# Patient Record
Sex: Female | Born: 1950 | Race: White | Hispanic: No | Smoking: Current every day smoker
Health system: Southern US, Community
[De-identification: ages and names within clinical notes are randomized; demographics above are authoritative.]

## PROBLEM LIST (undated history)

## (undated) DIAGNOSIS — F32A Depression, unspecified: Secondary | ICD-10-CM

## (undated) DIAGNOSIS — Z9889 Other specified postprocedural states: Secondary | ICD-10-CM

## (undated) DIAGNOSIS — R112 Nausea with vomiting, unspecified: Secondary | ICD-10-CM

## (undated) DIAGNOSIS — F329 Major depressive disorder, single episode, unspecified: Secondary | ICD-10-CM

## (undated) DIAGNOSIS — I10 Essential (primary) hypertension: Secondary | ICD-10-CM

## (undated) DIAGNOSIS — F419 Anxiety disorder, unspecified: Secondary | ICD-10-CM

## (undated) DIAGNOSIS — I82A12 Acute embolism and thrombosis of left axillary vein: Secondary | ICD-10-CM

## (undated) DIAGNOSIS — I2699 Other pulmonary embolism without acute cor pulmonale: Secondary | ICD-10-CM

## (undated) HISTORY — PX: HAND SURGERY: SHX662

## (undated) HISTORY — PX: ARTHROSCOPIC REPAIR ACL: SUR80

---

## 1999-02-04 ENCOUNTER — Other Ambulatory Visit: Admission: RE | Admit: 1999-02-04 | Discharge: 1999-02-04 | Payer: Self-pay | Admitting: Gynecology

## 1999-02-11 ENCOUNTER — Other Ambulatory Visit: Admission: RE | Admit: 1999-02-11 | Discharge: 1999-02-11 | Payer: Self-pay | Admitting: Gynecology

## 2001-03-03 ENCOUNTER — Other Ambulatory Visit: Admission: RE | Admit: 2001-03-03 | Discharge: 2001-03-03 | Payer: Self-pay | Admitting: Gynecology

## 2002-10-09 ENCOUNTER — Encounter: Payer: Self-pay | Admitting: Family Medicine

## 2002-10-09 ENCOUNTER — Encounter: Admission: RE | Admit: 2002-10-09 | Discharge: 2002-10-09 | Payer: Self-pay | Admitting: Family Medicine

## 2002-12-14 ENCOUNTER — Other Ambulatory Visit: Admission: RE | Admit: 2002-12-14 | Discharge: 2002-12-14 | Payer: Self-pay | Admitting: Gynecology

## 2004-12-18 ENCOUNTER — Other Ambulatory Visit: Admission: RE | Admit: 2004-12-18 | Discharge: 2004-12-18 | Payer: Self-pay | Admitting: Gynecology

## 2006-03-16 ENCOUNTER — Other Ambulatory Visit: Admission: RE | Admit: 2006-03-16 | Discharge: 2006-03-16 | Payer: Self-pay | Admitting: Gynecology

## 2006-03-24 ENCOUNTER — Encounter: Admission: RE | Admit: 2006-03-24 | Discharge: 2006-03-24 | Payer: Self-pay | Admitting: Surgery

## 2006-03-24 ENCOUNTER — Encounter (INDEPENDENT_AMBULATORY_CARE_PROVIDER_SITE_OTHER): Payer: Self-pay | Admitting: Specialist

## 2006-03-24 ENCOUNTER — Other Ambulatory Visit: Admission: RE | Admit: 2006-03-24 | Discharge: 2006-03-24 | Payer: Self-pay | Admitting: Interventional Radiology

## 2007-07-07 ENCOUNTER — Encounter: Admission: RE | Admit: 2007-07-07 | Discharge: 2007-07-07 | Payer: Self-pay | Admitting: Family Medicine

## 2007-07-09 ENCOUNTER — Encounter: Admission: RE | Admit: 2007-07-09 | Discharge: 2007-07-09 | Payer: Self-pay | Admitting: Family Medicine

## 2007-07-13 ENCOUNTER — Ambulatory Visit (HOSPITAL_COMMUNITY): Admission: RE | Admit: 2007-07-13 | Discharge: 2007-07-13 | Payer: Self-pay | Admitting: Family Medicine

## 2007-08-24 ENCOUNTER — Encounter: Admission: RE | Admit: 2007-08-24 | Discharge: 2007-08-24 | Payer: Self-pay | Admitting: Family Medicine

## 2008-11-24 ENCOUNTER — Emergency Department (HOSPITAL_BASED_OUTPATIENT_CLINIC_OR_DEPARTMENT_OTHER): Admission: EM | Admit: 2008-11-24 | Discharge: 2008-11-24 | Payer: Self-pay | Admitting: Emergency Medicine

## 2009-11-21 ENCOUNTER — Encounter: Admission: RE | Admit: 2009-11-21 | Discharge: 2009-11-21 | Payer: Self-pay | Admitting: Gynecology

## 2010-09-19 ENCOUNTER — Other Ambulatory Visit: Payer: Self-pay | Admitting: Gynecology

## 2010-09-19 DIAGNOSIS — Z1231 Encounter for screening mammogram for malignant neoplasm of breast: Secondary | ICD-10-CM

## 2010-09-19 DIAGNOSIS — Z09 Encounter for follow-up examination after completed treatment for conditions other than malignant neoplasm: Secondary | ICD-10-CM

## 2010-09-30 ENCOUNTER — Ambulatory Visit
Admission: RE | Admit: 2010-09-30 | Discharge: 2010-09-30 | Disposition: A | Discharge status: Home / Self Care | Payer: 59 | Source: Ambulatory Visit | Attending: Gynecology | Admitting: Gynecology

## 2010-09-30 ENCOUNTER — Ambulatory Visit: Payer: Self-pay

## 2010-09-30 DIAGNOSIS — Z1231 Encounter for screening mammogram for malignant neoplasm of breast: Secondary | ICD-10-CM

## 2010-09-30 DIAGNOSIS — Z09 Encounter for follow-up examination after completed treatment for conditions other than malignant neoplasm: Secondary | ICD-10-CM

## 2010-10-24 NOTE — Procedures (Signed)
CLINICAL HISTORY:  A 60 year old woman syncopal episodes.  The patient  describes as awake and alert.  This is a routine EEG done with photic  stimulation and hyperventilation.   DESCRIPTION:  Dominant rhythm's tracing is a moderate amplitude alpha  rhythm of 11 Hz which predominates posteriorly, appears without abnormal  asymmetry and attenuates with eye opening and closing.  Low amplitude  fast activity seen frontally and centrally and appears without abnormal  asymmetry.  No focal slowing is noted and no epileptiform discharge is  seen.  The patient remains in awake state throughout the recording.  Photic dilation produced strong driving responses.  Hyperventilation  produced no significant change the background rhythms.  Single channel  devoted to EKG revealed sinus rhythm throughout with rate of  approximately 72 beats per minute.   CONCLUSIONS:  Normal study in the awake state.      Michael L. Thad Ranger, M.D.  Electronically Signed     BJY:NWGN  D:  07/14/2007 07:45:57  T:  07/15/2007 05:26:59  Job #:  [id number]

## 2011-05-14 ENCOUNTER — Ambulatory Visit (INDEPENDENT_AMBULATORY_CARE_PROVIDER_SITE_OTHER): Payer: 59

## 2011-05-14 DIAGNOSIS — M79609 Pain in unspecified limb: Secondary | ICD-10-CM

## 2011-05-14 DIAGNOSIS — S61209A Unspecified open wound of unspecified finger without damage to nail, initial encounter: Secondary | ICD-10-CM

## 2011-08-18 ENCOUNTER — Ambulatory Visit
Admission: RE | Admit: 2011-08-18 | Discharge: 2011-08-18 | Disposition: A | Discharge status: Home / Self Care | Payer: 59 | Source: Ambulatory Visit | Attending: Family Medicine | Admitting: Family Medicine

## 2011-08-18 ENCOUNTER — Other Ambulatory Visit: Payer: Self-pay | Admitting: Family Medicine

## 2011-08-18 DIAGNOSIS — I2510 Atherosclerotic heart disease of native coronary artery without angina pectoris: Secondary | ICD-10-CM

## 2012-07-05 ENCOUNTER — Encounter (HOSPITAL_COMMUNITY): Payer: Self-pay | Admitting: *Deleted

## 2012-07-05 ENCOUNTER — Emergency Department (HOSPITAL_COMMUNITY)
Admission: EM | Admit: 2012-07-05 | Discharge: 2012-07-05 | Disposition: A | Discharge status: Home / Self Care | Payer: 59 | Attending: Emergency Medicine | Admitting: Emergency Medicine

## 2012-07-05 DIAGNOSIS — T7840XA Allergy, unspecified, initial encounter: Secondary | ICD-10-CM

## 2012-07-05 DIAGNOSIS — Z87891 Personal history of nicotine dependence: Secondary | ICD-10-CM | POA: Insufficient documentation

## 2012-07-05 DIAGNOSIS — E119 Type 2 diabetes mellitus without complications: Secondary | ICD-10-CM | POA: Insufficient documentation

## 2012-07-05 DIAGNOSIS — Z7982 Long term (current) use of aspirin: Secondary | ICD-10-CM | POA: Insufficient documentation

## 2012-07-05 DIAGNOSIS — L509 Urticaria, unspecified: Secondary | ICD-10-CM | POA: Insufficient documentation

## 2012-07-05 MED ORDER — PREDNISONE 20 MG PO TABS
60.0000 mg | ORAL_TABLET | Freq: Once | ORAL | Status: AC
  Administered 2012-07-05: 60 mg via ORAL
  Filled 2012-07-05: qty 3

## 2012-07-05 MED ORDER — SODIUM CHLORIDE 0.9 % IV BOLUS (SEPSIS)
500.0000 mL | Freq: Once | INTRAVENOUS | Status: AC
  Administered 2012-07-05: 500 mL via INTRAVENOUS

## 2012-07-05 MED ORDER — EPINEPHRINE 0.3 MG/0.3ML IJ DEVI: 0.3000 mg | INTRAMUSCULAR | Status: AC | PRN

## 2012-07-05 MED ORDER — FAMOTIDINE IN NACL 20-0.9 MG/50ML-% IV SOLN
20.0000 mg | Freq: Once | INTRAVENOUS | Status: DC
  Filled 2012-07-05: qty 50

## 2012-07-05 NOTE — ED Notes (Signed)
Around 11pm she noticed hives on her feet, legs, neck, and mild swelling to her tongue.  She had mild syncope when she stood up.  Breath sound clear, BP 132/90 PTA, HR is the 80s, 99-100% Or sats on R/A.  Patient took 50 mg of benadryl PO before [facility] arrived around 23:30 and [facility] gave 25 mg IV around 12:31, 50mg  of Zantac was given as well.  Resp. Intact.  20g to left hand.

## 2012-07-05 NOTE — ED Notes (Signed)
Patient states that her tongue is back to normal, no tongue swelling, or lips swelling.

## 2012-07-05 NOTE — ED Notes (Signed)
WUJ:[id number]<JY> Expected date:07/05/12<BR> Expected time:12:37 AM<BR> Means of arrival:Ambulance<BR> Comments:<BR> Allergic reaction/Hives

## 2012-07-05 NOTE — ED Provider Notes (Signed)
History     CSN: [id number]  Arrival date & time 07/05/12  0104   First MD Initiated Contact with Patient 07/05/12 0154      Chief Complaint  Patient presents with  . Allergic Reaction    (Consider location/radiation/quality/duration/timing/severity/associated sxs/prior treatment) HPI Comments: Tina Mccullough is a 62 y.o. female with a history of diabetes and anaphylactic allergic reaction presents emergency department complaining of allergic reaction.  Patient states that there was no known triggering medication or allergen.  Onset of symptoms began approximately 1130 p.m.  Patient states that she noticed urticarial rash on her ankles and upper extremities and then she felt her tongue and lips begin taking well.  Patient then went to the kitchen to take some Benadryl when she felt lightheaded.  Her husband at this time became concerned because this is a very similar presentation to the last time she had an anaphylactic reaction.  Since the onset patient states that she no longer is having any tingling sensation of her mouth and denies any throat closing sensation.  Patient has not had any difficulty breathing or swallowing secretions.  In route EMS gave the patient another Benadryl 25 mg IV and Zantac 50 mg.  Patient is a 62 y.o. female presenting with allergic reaction. The history is provided by the patient.  Allergic Reaction    History reviewed. No pertinent past medical history.  History reviewed. No pertinent past surgical history.  History reviewed. No pertinent family history.  History  Substance Use Topics  . Smoking status: Former Games developer  . Smokeless tobacco: Not on file  . Alcohol Use: No    OB History    Grav Para Term Preterm Abortions TAB SAB Ect Mult Living                  Review of Systems  All other systems reviewed and are negative.    Allergies  Review of patient's allergies indicates no known allergies.  Home Medications   Current  Outpatient Rx  Name  Route  Sig  Dispense  Refill  . ASPIRIN 81 MG PO CHEW   Oral   Chew 81 mg by mouth daily.         Marland Kitchen DIPHENHYDRAMINE HCL 25 MG PO CAPS   Oral   Take 25 mg by mouth every 6 (six) hours as needed. Allergies           BP 142/88  Pulse 77  Temp 97.5 F (36.4 C) (Oral)  Resp 14  SpO2 97%  Physical Exam  Nursing note and vitals reviewed. Constitutional: She is oriented to person, place, and time. She appears well-developed and well-nourished. No distress.  HENT:  Head: Normocephalic and atraumatic.  Mouth/Throat: Oropharynx is clear and moist and mucous membranes are normal.       No sign of airway obstruction. No edema of face, eyelids, lips, tongue, uvula.Marland Kitchen Uvula midline, no nasal congestion or drooling.  Tongue not elevated. No trismus.  Neck: Trachea normal, normal range of motion and full passive range of motion without pain. Neck supple. Carotid bruit is not present. No tracheal deviation present.       No carotid bruits or stridor  Cardiovascular: Normal rate, regular rhythm, intact distal pulses and normal pulses.        Not tachycardic  Pulmonary/Chest: Effort normal. No stridor.  Musculoskeletal: Normal range of motion.  Neurological: She is alert and oriented to person, place, and time.  Skin: Skin is warm and intact.  Rash noted. Rash is urticarial. She is not diaphoretic.       Not diaphoretic. Very mild raised erythematous welts, pruritic in nature, located forearms and lower extremities. Blanchable urticaria, no petechiae or purpura.   Psychiatric: She has a normal mood and affect. Her behavior is normal.    ED Course  Procedures (including critical care time)  Labs Reviewed - No data to display No results found.   No diagnosis found.    MDM  Mild allergic reaction  Patient re-evaluated prior to dc, is hemodynamically stable, in no respiratory distress, and denies the feeling of throat closing. Pt has been advised to take OTC  benadryl & return to the ED if they have a mod-severe allergic rxn (s/s including throat closing, difficulty breathing, swelling of lips face or tongue). Pt is to follow up with their PCP. Pt is agreeable with plan & verbalizes understanding. Will dc w RX for epi pens per request of pt as hers have expired.          Jaci Carrel, [place] 07/05/12 0214

## 2012-07-05 NOTE — ED Provider Notes (Signed)
Medical screening examination/treatment/procedure(s) were performed by non-physician practitioner and as supervising physician I was immediately available for consultation/collaboration.   Lyanne Co, MD 07/05/12 [phone]

## 2012-08-30 ENCOUNTER — Other Ambulatory Visit (HOSPITAL_COMMUNITY)
Admission: RE | Admit: 2012-08-30 | Discharge: 2012-08-30 | Disposition: A | Discharge status: Home / Self Care | Payer: 59 | Source: Ambulatory Visit | Attending: Family Medicine | Admitting: Family Medicine

## 2012-08-30 ENCOUNTER — Other Ambulatory Visit: Payer: Self-pay | Admitting: Family Medicine

## 2012-08-30 DIAGNOSIS — Z124 Encounter for screening for malignant neoplasm of cervix: Secondary | ICD-10-CM | POA: Insufficient documentation

## 2012-08-30 DIAGNOSIS — Z1151 Encounter for screening for human papillomavirus (HPV): Secondary | ICD-10-CM | POA: Insufficient documentation

## 2012-09-06 ENCOUNTER — Other Ambulatory Visit: Payer: Self-pay | Admitting: Family Medicine

## 2012-09-07 ENCOUNTER — Other Ambulatory Visit: Payer: Self-pay | Admitting: Family Medicine

## 2012-09-07 DIAGNOSIS — R591 Generalized enlarged lymph nodes: Secondary | ICD-10-CM

## 2012-11-16 ENCOUNTER — Other Ambulatory Visit: Payer: 59

## 2013-01-16 ENCOUNTER — Inpatient Hospital Stay: Admission: RE | Admit: 2013-01-16 | Payer: 59 | Source: Ambulatory Visit

## 2013-09-07 ENCOUNTER — Ambulatory Visit
Admission: RE | Admit: 2013-09-07 | Discharge: 2013-09-07 | Disposition: A | Discharge status: Home / Self Care | Payer: 59 | Source: Ambulatory Visit | Attending: Family Medicine | Admitting: Family Medicine

## 2013-09-07 ENCOUNTER — Other Ambulatory Visit: Payer: Self-pay | Admitting: Family Medicine

## 2013-09-07 DIAGNOSIS — Z09 Encounter for follow-up examination after completed treatment for conditions other than malignant neoplasm: Secondary | ICD-10-CM

## 2014-04-16 ENCOUNTER — Ambulatory Visit (HOSPITAL_COMMUNITY)
Admission: RE | Admit: 2014-04-16 | Discharge: 2014-04-16 | Disposition: A | Discharge status: Home / Self Care | Payer: 59 | Source: Ambulatory Visit | Attending: Surgery | Admitting: Surgery

## 2014-04-16 ENCOUNTER — Other Ambulatory Visit (HOSPITAL_COMMUNITY): Payer: Self-pay | Admitting: Family Medicine

## 2014-04-16 DIAGNOSIS — M79602 Pain in left arm: Secondary | ICD-10-CM | POA: Diagnosis not present

## 2014-04-18 ENCOUNTER — Encounter (HOSPITAL_COMMUNITY): Payer: Self-pay

## 2014-04-18 ENCOUNTER — Ambulatory Visit (HOSPITAL_COMMUNITY)
Admission: RE | Admit: 2014-04-18 | Discharge: 2014-04-18 | Disposition: A | Discharge status: Home / Self Care | Payer: 59 | Source: Ambulatory Visit | Attending: Family Medicine | Admitting: Family Medicine

## 2014-04-18 ENCOUNTER — Other Ambulatory Visit (HOSPITAL_COMMUNITY): Payer: Self-pay | Admitting: Family Medicine

## 2014-04-18 DIAGNOSIS — I82622 Acute embolism and thrombosis of deep veins of left upper extremity: Secondary | ICD-10-CM

## 2014-04-18 DIAGNOSIS — M25512 Pain in left shoulder: Secondary | ICD-10-CM

## 2014-04-18 DIAGNOSIS — R222 Localized swelling, mass and lump, trunk: Secondary | ICD-10-CM | POA: Diagnosis not present

## 2014-04-18 MED ORDER — IOHEXOL 300 MG/ML  SOLN
80.0000 mL | Freq: Once | INTRAMUSCULAR | Status: AC | PRN
  Administered 2014-04-18: 80 mL via INTRAVENOUS

## 2014-11-21 NOTE — H&P (Signed)
Tina Mccullough is an 64 y.o. female.   Chief Complaint: tumor left mandible  HPI: biopsied last year.   Unknown duration  No past medical history on file.  No past surgical history on file.  No family history on file. Social History:  reports that she has quit smoking. She does not have any smokeless tobacco history on file. She reports that she does not drink alcohol. Her drug history is not on file.  Allergies: No Known Allergies  No prescriptions prior to admission    No results found for this or any previous visit (from the past 48 hour(s)). No results found.  ROS  There were no vitals taken for this visit. Physical Exam  HENT:  Head: Normocephalic.  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

## 2014-11-21 NOTE — Consult Note (Signed)
  This is a 64 y/o wd/wn w female who presents with a large odontogenic tumor and impacted third molar in the left mandible.  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  It was recommended that the tooth and rest of the cyst be removed. 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  Indications for treatment and alternatives were discussed.  Possible paresthesia for the lip tongue and chin was discussed.  Possible need for further surgery was discussed.

## 2014-11-21 NOTE — H&P (Signed)
  H & P by Dr. Serita Grammes

## 2014-11-26 ENCOUNTER — Encounter (HOSPITAL_BASED_OUTPATIENT_CLINIC_OR_DEPARTMENT_OTHER)
Admission: RE | Admit: 2014-11-26 | Discharge: 2014-11-26 | Disposition: A | Discharge status: Home / Self Care | Payer: 59 | Source: Ambulatory Visit | Attending: Oral Surgery | Admitting: Oral Surgery

## 2014-11-26 ENCOUNTER — Encounter (HOSPITAL_BASED_OUTPATIENT_CLINIC_OR_DEPARTMENT_OTHER): Payer: Self-pay | Admitting: *Deleted

## 2014-11-26 DIAGNOSIS — Z86718 Personal history of other venous thrombosis and embolism: Secondary | ICD-10-CM | POA: Diagnosis not present

## 2014-11-26 DIAGNOSIS — K011 Impacted teeth: Secondary | ICD-10-CM | POA: Diagnosis not present

## 2014-11-26 DIAGNOSIS — Z87891 Personal history of nicotine dependence: Secondary | ICD-10-CM | POA: Diagnosis not present

## 2014-11-26 DIAGNOSIS — K090 Developmental odontogenic cysts: Secondary | ICD-10-CM | POA: Diagnosis not present

## 2014-11-26 LAB — BASIC METABOLIC PANEL
ANION GAP: 7 (ref 5–15)
BUN: 11 mg/dL (ref 6–20)
CHLORIDE: 103 mmol/L (ref 101–111)
CO2: 28 mmol/L (ref 22–32)
CREATININE: 0.92 mg/dL (ref 0.44–1.00)
Calcium: 9.9 mg/dL (ref 8.9–10.3)
GFR calc Af Amer: >60 mL/min (ref >60)
Glucose, Bld: 92 mg/dL (ref 65–99)
POTASSIUM: 3.8 mmol/L (ref 3.5–5.1)
Sodium: 138 mmol/L (ref 135–145)

## 2014-11-28 ENCOUNTER — Encounter (HOSPITAL_BASED_OUTPATIENT_CLINIC_OR_DEPARTMENT_OTHER)
Admission: RE | Disposition: A | Discharge status: Home / Self Care | Payer: Self-pay | Source: Ambulatory Visit | Attending: Oral Surgery

## 2014-11-28 ENCOUNTER — Ambulatory Visit (HOSPITAL_BASED_OUTPATIENT_CLINIC_OR_DEPARTMENT_OTHER): Payer: 59 | Admitting: Anesthesiology

## 2014-11-28 ENCOUNTER — Encounter (HOSPITAL_BASED_OUTPATIENT_CLINIC_OR_DEPARTMENT_OTHER): Payer: Self-pay | Admitting: *Deleted

## 2014-11-28 ENCOUNTER — Ambulatory Visit (HOSPITAL_BASED_OUTPATIENT_CLINIC_OR_DEPARTMENT_OTHER)
Admission: RE | Admit: 2014-11-28 | Discharge: 2014-11-28 | Disposition: A | Discharge status: Home / Self Care | Payer: 59 | Source: Ambulatory Visit | Attending: Oral Surgery | Admitting: Oral Surgery

## 2014-11-28 DIAGNOSIS — Z87891 Personal history of nicotine dependence: Secondary | ICD-10-CM | POA: Insufficient documentation

## 2014-11-28 DIAGNOSIS — Z86718 Personal history of other venous thrombosis and embolism: Secondary | ICD-10-CM | POA: Insufficient documentation

## 2014-11-28 DIAGNOSIS — K011 Impacted teeth: Secondary | ICD-10-CM | POA: Insufficient documentation

## 2014-11-28 DIAGNOSIS — K090 Developmental odontogenic cysts: Secondary | ICD-10-CM | POA: Insufficient documentation

## 2014-11-28 HISTORY — DX: Major depressive disorder, single episode, unspecified: F32.9

## 2014-11-28 HISTORY — DX: Depression, unspecified: F32.A

## 2014-11-28 HISTORY — DX: Anxiety disorder, unspecified: F41.9

## 2014-11-28 HISTORY — DX: Essential (primary) hypertension: I10

## 2014-11-28 HISTORY — DX: Nausea with vomiting, unspecified: Z98.890

## 2014-11-28 HISTORY — PX: EXCISION ORAL TUMOR: SHX6265

## 2014-11-28 HISTORY — DX: Acute embolism and thrombosis of left axillary vein: I82.A12

## 2014-11-28 HISTORY — DX: Nausea with vomiting, unspecified: R11.2

## 2014-11-28 LAB — POCT HEMOGLOBIN-HEMACUE: HEMOGLOBIN: 13.8 g/dL (ref 12.0–15.0)

## 2014-11-28 SURGERY — EXCISION, NEOPLASM, MOUTH
Anesthesia: General | Site: Mouth | Laterality: Left

## 2014-11-28 MED ORDER — CEPHALEXIN 500 MG PO CAPS: 500.0000 mg | ORAL_CAPSULE | Freq: Four times a day (QID) | ORAL | Status: DC

## 2014-11-28 MED ORDER — HYDROMORPHONE HCL 1 MG/ML IJ SOLN: 0.2500 mg | INTRAMUSCULAR | Status: DC | PRN

## 2014-11-28 MED ORDER — PROMETHAZINE HCL 25 MG/ML IJ SOLN
6.2500 mg | INTRAMUSCULAR | Status: DC | PRN
  Administered 2014-11-28: 6.25 mg via INTRAVENOUS

## 2014-11-28 MED ORDER — PROMETHAZINE HCL 25 MG/ML IJ SOLN: 6.2500 mg | INTRAMUSCULAR | Status: DC | PRN

## 2014-11-28 MED ORDER — GLYCOPYRROLATE 0.2 MG/ML IJ SOLN: 0.2000 mg | Freq: Once | INTRAMUSCULAR | Status: DC | PRN

## 2014-11-28 MED ORDER — MIDAZOLAM HCL 2 MG/2ML IJ SOLN
1.0000 mg | INTRAMUSCULAR | Status: DC | PRN
  Administered 2014-11-28: 2 mg via INTRAVENOUS

## 2014-11-28 MED ORDER — SCOPOLAMINE 1 MG/3DAYS TD PT72: 1.0000 | MEDICATED_PATCH | Freq: Once | TRANSDERMAL | Status: DC | PRN

## 2014-11-28 MED ORDER — LACTATED RINGERS IV SOLN
INTRAVENOUS | Status: DC
  Administered 2014-11-28 (×2): via INTRAVENOUS

## 2014-11-28 MED ORDER — LIDOCAINE-EPINEPHRINE 2 %-1:100000 IJ SOLN
INTRAMUSCULAR | Status: DC | PRN
  Administered 2014-11-28: 1.7 mL via INTRADERMAL

## 2014-11-28 MED ORDER — SUCCINYLCHOLINE CHLORIDE 20 MG/ML IJ SOLN
INTRAMUSCULAR | Status: DC | PRN
  Administered 2014-11-28: 100 mg via INTRAVENOUS

## 2014-11-28 MED ORDER — HYDROMORPHONE HCL 1 MG/ML IJ SOLN
INTRAMUSCULAR | Status: AC
  Filled 2014-11-28: qty 1

## 2014-11-28 MED ORDER — FENTANYL CITRATE (PF) 100 MCG/2ML IJ SOLN
50.0000 ug | INTRAMUSCULAR | Status: DC | PRN
  Administered 2014-11-28 (×2): 50 ug via INTRAVENOUS

## 2014-11-28 MED ORDER — ONDANSETRON HCL 4 MG/2ML IJ SOLN
INTRAMUSCULAR | Status: DC | PRN
  Administered 2014-11-28: 4 mg via INTRAVENOUS

## 2014-11-28 MED ORDER — CEFAZOLIN SODIUM-DEXTROSE 2-3 GM-% IV SOLR
2.0000 g | INTRAVENOUS | Status: AC
  Administered 2014-11-28: 2 g via INTRAVENOUS

## 2014-11-28 MED ORDER — BENZOIN COMPOUND EX TINC
CUTANEOUS | Status: DC | PRN
  Administered 2014-11-28: 1 via TOPICAL

## 2014-11-28 MED ORDER — HYDROMORPHONE HCL 1 MG/ML IJ SOLN
0.2500 mg | INTRAMUSCULAR | Status: DC | PRN
  Administered 2014-11-28 (×2): 0.25 mg via INTRAVENOUS

## 2014-11-28 MED ORDER — MIDAZOLAM HCL 2 MG/2ML IJ SOLN
INTRAMUSCULAR | Status: AC
  Filled 2014-11-28: qty 2

## 2014-11-28 MED ORDER — DEXAMETHASONE SODIUM PHOSPHATE 4 MG/ML IJ SOLN
INTRAMUSCULAR | Status: DC | PRN
  Administered 2014-11-28: 10 mg via INTRAVENOUS

## 2014-11-28 MED ORDER — LIDOCAINE HCL (CARDIAC) 10 MG/ML IV SOLN
INTRAVENOUS | Status: DC | PRN
  Administered 2014-11-28: 80 mg via INTRAVENOUS

## 2014-11-28 MED ORDER — HYDROCODONE-ACETAMINOPHEN 5-325 MG PO TABS: 1.0000 | ORAL_TABLET | Freq: Four times a day (QID) | ORAL | Status: DC | PRN

## 2014-11-28 MED ORDER — LIDOCAINE-EPINEPHRINE 2 %-1:100000 IJ SOLN
INTRAMUSCULAR | Status: AC
  Filled 2014-11-28: qty 6.8

## 2014-11-28 MED ORDER — FENTANYL CITRATE (PF) 100 MCG/2ML IJ SOLN
INTRAMUSCULAR | Status: AC
  Filled 2014-11-28: qty 6

## 2014-11-28 MED ORDER — BACITRACIN-NEOMYCIN-POLYMYXIN 400-5-5000 EX OINT
TOPICAL_OINTMENT | CUTANEOUS | Status: AC
  Filled 2014-11-28: qty 1

## 2014-11-28 MED ORDER — BUPIVACAINE-EPINEPHRINE (PF) 0.5% -1:200000 IJ SOLN
INTRAMUSCULAR | Status: AC
  Filled 2014-11-28: qty 7.2

## 2014-11-28 MED ORDER — CEFAZOLIN SODIUM-DEXTROSE 2-3 GM-% IV SOLR
INTRAVENOUS | Status: AC
  Filled 2014-11-28: qty 50

## 2014-11-28 MED ORDER — PROMETHAZINE HCL 25 MG/ML IJ SOLN
INTRAMUSCULAR | Status: AC
  Filled 2014-11-28: qty 1

## 2014-11-28 MED ORDER — PROPOFOL 10 MG/ML IV BOLUS
INTRAVENOUS | Status: DC | PRN
  Administered 2014-11-28: 180 mg via INTRAVENOUS
  Administered 2014-11-28: 50 mg via INTRAVENOUS

## 2014-11-28 SURGICAL SUPPLY — 34 items
BENZOIN TINCTURE PRP APPL 2/3 (GAUZE/BANDAGES/DRESSINGS) IMPLANT
BLADE SURG 15 STRL LF DISP TIS (BLADE) ×1 IMPLANT
BLADE SURG 15 STRL SS (BLADE) ×1
BUR OVAL 4.0X59 (BURR) ×4 IMPLANT
CANISTER SUCT 1200ML W/VALVE (MISCELLANEOUS) ×2 IMPLANT
CATH ROBINSON RED A/P 10FR (CATHETERS) IMPLANT
COVER BACK TABLE 60X90IN (DRAPES) ×2 IMPLANT
COVER MAYO STAND STRL (DRAPES) ×2 IMPLANT
DRAPE U-SHAPE 76X120 STRL (DRAPES) ×2 IMPLANT
GAUZE PACKING IODOFORM 1/4X15 (GAUZE/BANDAGES/DRESSINGS) IMPLANT
GLOVE BIO SURGEON STRL SZ 6.5 (GLOVE) ×4 IMPLANT
GLOVE BIO SURGEON STRL SZ7.5 (GLOVE) ×2 IMPLANT
GOWN STRL REUS W/ TWL LRG LVL3 (GOWN DISPOSABLE) ×1 IMPLANT
GOWN STRL REUS W/ TWL XL LVL3 (GOWN DISPOSABLE) IMPLANT
GOWN STRL REUS W/TWL LRG LVL3 (GOWN DISPOSABLE) ×1
GOWN STRL REUS W/TWL XL LVL3 (GOWN DISPOSABLE)
IV NS 500ML (IV SOLUTION)
IV NS 500ML BAXH (IV SOLUTION) IMPLANT
NEEDLE DENTAL 27 LONG (NEEDLE) ×2 IMPLANT
NS IRRIG 1000ML POUR BTL (IV SOLUTION) ×2 IMPLANT
PACK BASIN DAY SURGERY FS (CUSTOM PROCEDURE TRAY) ×2 IMPLANT
PATTIES SURGICAL .5 X3 (DISPOSABLE) IMPLANT
SLEEVE SCD COMPRESS KNEE MED (MISCELLANEOUS) ×2 IMPLANT
SPONGE SURGIFOAM ABS GEL 12-7 (HEMOSTASIS) IMPLANT
SUT CHROMIC 3 0 PS 2 (SUTURE) IMPLANT
SUT CHROMIC 4 0 P 3 18 (SUTURE) IMPLANT
SUT SILK 3 0 PS 1 (SUTURE) IMPLANT
SYR 50ML LL SCALE MARK (SYRINGE) ×4 IMPLANT
TOOTHBRUSH ADULT (PERSONAL CARE ITEMS) ×2 IMPLANT
TOWEL OR 17X24 6PK STRL BLUE (TOWEL DISPOSABLE) ×4 IMPLANT
TOWEL OR NON WOVEN STRL DISP B (DISPOSABLE) ×2 IMPLANT
TUBE CONNECTING 20X1/4 (TUBING) ×2 IMPLANT
VENT IRR SPI W TUB AD (MISCELLANEOUS) IMPLANT
YANKAUER SUCT BULB TIP NO VENT (SUCTIONS) ×2 IMPLANT

## 2014-11-28 NOTE — Discharge Instructions (Signed)

## 2014-11-28 NOTE — H&P (Signed)
  Surgery reviewed with patient.   No change in H & P

## 2014-11-28 NOTE — Op Note (Signed)
The patient was brought to the operating room and placed in a supine position in which she remained through the whole procedure.  She was intubated via an oral endotracheal tube. The mouth and face were prepped in the usual fashion for an oral surgical procedure.  A throat pack was placed. 1.5 cc of 2% xylocaine with 1:100000 epi was given as a block on the lower left side.  A child size bite block was used.  A #15 blade was used to make a 2 cm incision over the left ramus.  A release was created at the distobuccal aspect of #19.  A full thickness mucoperiosteal flap was elevated with a periosteal elevator.  A round bur and copious irrigation was used to expose the area.  Soft tissue was curetted out and submitted for path. An attempt was made to mobilize the tooth which was not able to be mobilized.  The crown was sectioned off to create space.  The roots were unable to be mobilized and there for cut below the level of the lesion.  Because this is a dentigerous cyst a coronectomy was done. The defect was further opened to gain access to remove the rest of the lesion.  All soft tissue was submitted.  The area was aggressively curetted and irrigated. 1/4" iodoform gauze was packed into the defect with benzoin and neosporin.  There was good hemostasis.  The patient was extubated on the table and discharged to home. She will be followed by me in my pvt office.

## 2014-11-28 NOTE — Anesthesia Preprocedure Evaluation (Addendum)
Anesthesia Evaluation  Patient identified by MRN, date of birth, ID band Patient awake    Reviewed: Allergy & Precautions, NPO status , Patient's Chart, lab work & pertinent test results  Airway Mallampati: I  TM Distance: >3 FB Neck ROM: Full    Dental  (+) Teeth Intact   Pulmonary Current Smoker,  breath sounds clear to auscultation        Cardiovascular hypertension, + Peripheral Vascular Disease Rhythm:Regular Rate:Normal     Neuro/Psych Anxiety Depression    GI/Hepatic negative GI ROS, Neg liver ROS,   Endo/Other  negative endocrine ROS  Renal/GU negative Renal ROS     Musculoskeletal negative musculoskeletal ROS (+)   Abdominal   Peds  Hematology negative hematology ROS (+)   Anesthesia Other Findings   Reproductive/Obstetrics                            Anesthesia Physical Anesthesia Plan  ASA: II  Anesthesia Plan: General   Post-op Pain Management:    Induction: Intravenous  Airway Management Planned: Oral ETT  Additional Equipment:   Intra-op Plan:   Post-operative Plan:   Informed Consent: I have reviewed the patients History and Physical, chart, labs and discussed the procedure including the risks, benefits and alternatives for the proposed anesthesia with the patient or authorized representative who has indicated his/her understanding and acceptance.   Dental advisory given  Plan Discussed with: CRNA and Surgeon  Anesthesia Plan Comments:        Anesthesia Quick Evaluation

## 2014-11-28 NOTE — Anesthesia Procedure Notes (Signed)
Procedure Name: Intubation Date/Time: 11/28/2014 8:26 AM Performed by: Lyndee Leo Pre-anesthesia Checklist: Patient identified, Emergency Drugs available, Suction available and Patient being monitored Patient Re-evaluated:Patient Re-evaluated prior to inductionOxygen Delivery Method: Circle System Utilized Preoxygenation: Pre-oxygenation with 100% oxygen Intubation Type: IV induction Ventilation: Mask ventilation without difficulty Laryngoscope Size: Mac and 3 Grade View: Grade II Tube type: Oral Tube size: 7.0 mm Number of attempts: 1 Airway Equipment and Method: Stylet and Oral airway Placement Confirmation: ETT inserted through vocal cords under direct vision,  positive ETCO2 and breath sounds checked- equal and bilateral Tube secured with: Tape Dental Injury: Teeth and Oropharynx as per pre-operative assessment

## 2014-11-28 NOTE — Transfer of Care (Signed)
Immediate Anesthesia Transfer of Care Note  Patient: Tina Mccullough  Procedure(s) Performed: Procedure(s): SURGICAL REMOVAL OF ODOTOGENIC  TUMOR AND IMPACTED TOOTH NUMBER 17 (Left)  Patient Location: PACU  Anesthesia Type:General  Level of Consciousness: awake, sedated and patient cooperative  Airway & Oxygen Therapy: Patient Spontanous Breathing and Patient connected to face mask oxygen  Post-op Assessment: Report given to RN and Post -op Vital signs reviewed and stable  Post vital signs: Reviewed and stable  Last Vitals:  Filed Vitals:   11/28/14 0741  BP: 136/82  Pulse: 92  Temp: 36.7 C  Resp: 10    Complications: No apparent anesthesia complications

## 2014-11-28 NOTE — Brief Op Note (Signed)
11/28/2014  9:31 AM  PATIENT:  Jannetta Quint Folta  64 y.o. female  PRE-OPERATIVE DIAGNOSIS:  ODOTOGENIC TUMOR LOWER LEFT AREA  POST-OPERATIVE DIAGNOSIS:  ODOTOGENIC TUMOR LOWER LEFT AREA  PROCEDURE:  Procedure(s): SURGICAL REMOVAL OF ODOTOGENIC  TUMOR AND IMPACTED TOOTH NUMBER 17 (Left)  SURGEON:  Surgeon(s) and Role:    * Jannette Fogo, DDS - Primary  PHYSICIAN ASSISTANT:   ASSISTANTS: Octaviano Glow    ANESTHESIA:   general  EBL:  Total I/O In: 900 [I.V.:900] Out: -   BLOOD ADMINISTERED:none  DRAINS: none and Penrose drain in the iodorm drain in cyst cavit   LOCAL MEDICATIONS USED:  XYLOCAINE   SPECIMEN:  Source of Specimen:  left mandible  DISPOSITION OF SPECIMEN:  PATHOLOGY  COUNTS:  YES  TOURNIQUET:  * No tourniquets in log *  DICTATION: .Dragon Dictation  PLAN OF CARE: Discharge to home after PACU  PATIENT DISPOSITION:  PACU - hemodynamically stable.   Delay start of Pharmacological VTE agent (>24hrs) due to surgical blood loss or risk of bleeding:no

## 2014-11-28 NOTE — Anesthesia Postprocedure Evaluation (Signed)
  Anesthesia Post-op Note  Patient: Tina Mccullough  Procedure(s) Performed: Procedure(s): SURGICAL REMOVAL OF ODOTOGENIC  TUMOR AND IMPACTED TOOTH NUMBER 17 (Left)  Patient Location: PACU  Anesthesia Type:General  Level of Consciousness: awake and alert   Airway and Oxygen Therapy: Patient Spontanous Breathing  Post-op Pain: mild  Post-op Assessment: Post-op Vital signs reviewed              Post-op Vital Signs: stable  Last Vitals:  Filed Vitals:   11/28/14 1100  BP: 141/76  Pulse: 78  Temp: 36.7 C  Resp: 16    Complications: No apparent anesthesia complications

## 2014-11-29 ENCOUNTER — Encounter (HOSPITAL_BASED_OUTPATIENT_CLINIC_OR_DEPARTMENT_OTHER): Payer: Self-pay | Admitting: Oral Surgery

## 2015-10-22 IMAGING — CT CT CHEST W/ CM
2 of 3 series · 15 of 36 positions shown, 18 images · IV contrast (omnipaque)
Comparison: Plain film of for 09/07/13.  No prior CT.

CLINICAL DATA: Blood clot in left arm. Pain in left shoulder.
Evaluate for mass in chest causing thrombus.

EXAM:
CT CHEST WITH CONTRAST
TECHNIQUE: Multidetector CT imaging of the chest was performed during
intravenous contrast administration.
CONTRAST:  80mL OMNIPAQUE IOHEXOL 300 MG/ML  SOLN

[Series 2: thorax 5.0 i31f 1 · axial · 0.72mm/px · z∈[+856,+1131]mm · 12 of 65 slices shown, 15 images]
[im 5/65  mediastinal]
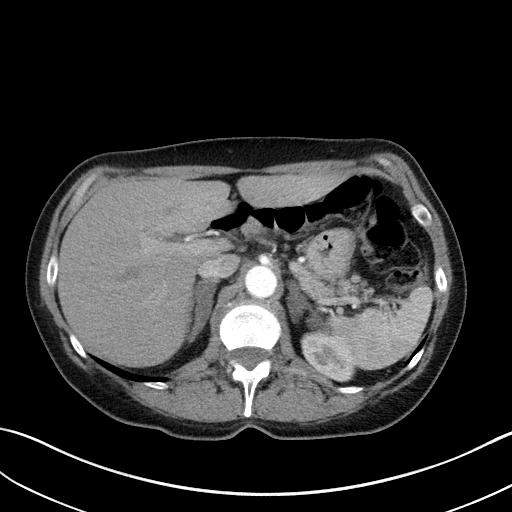
[im 5/65  lung]
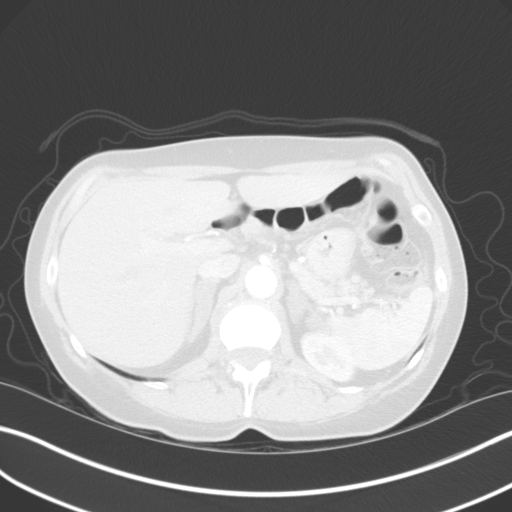
[im 10/65  lung]
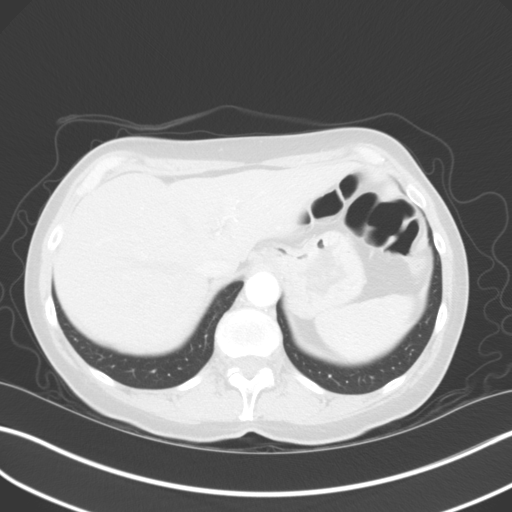
[im 15/65  lung]
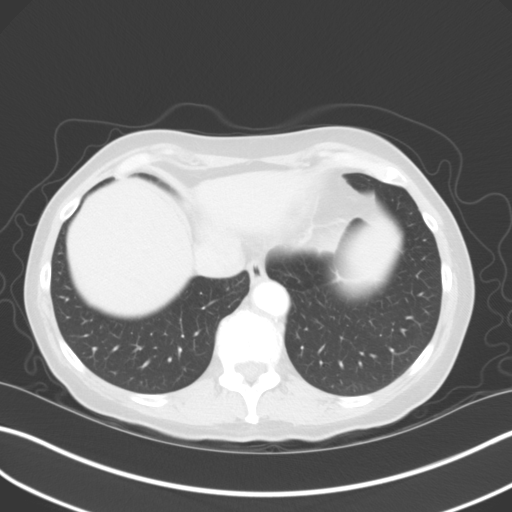
[im 19/65  lung]
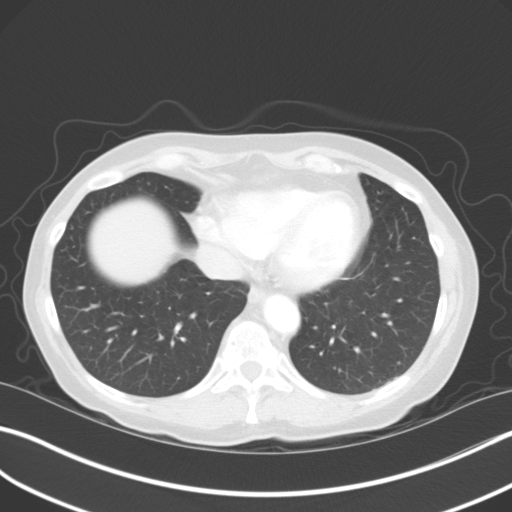
[im 24/65  mediastinal]
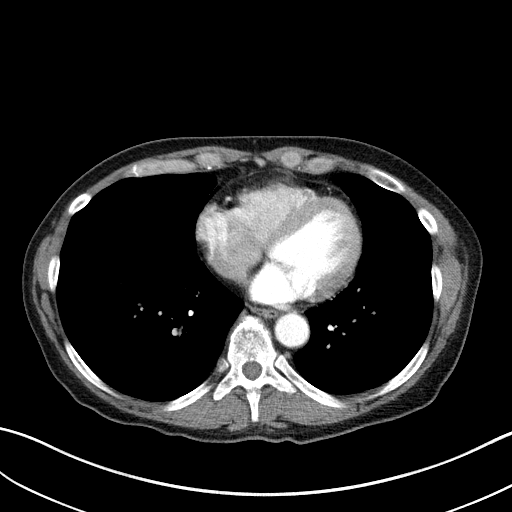
[im 24/65  lung]
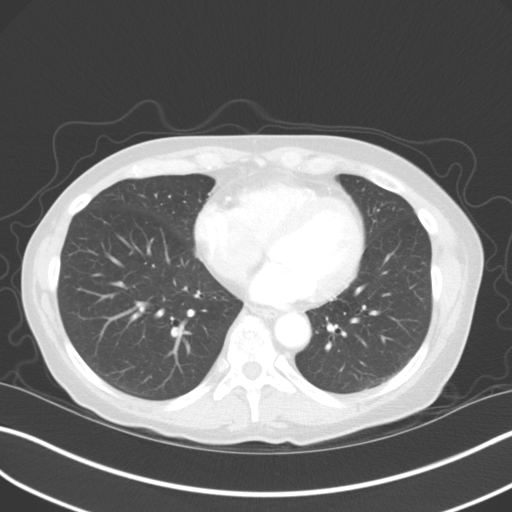
[im 29/65  lung]
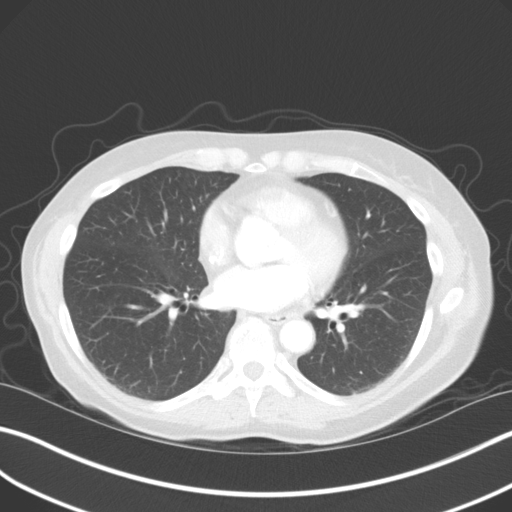
[im 36/65  lung]
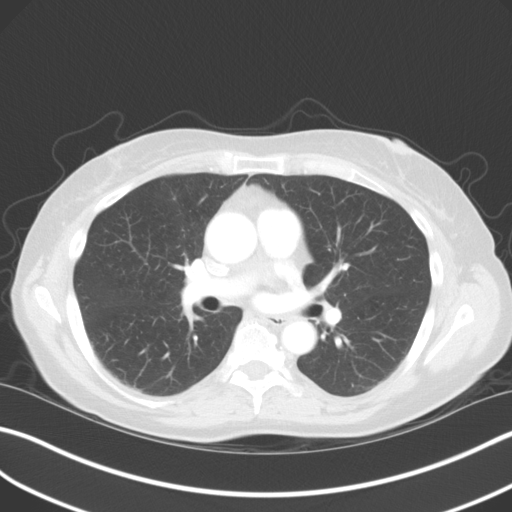
[im 41/65  lung]
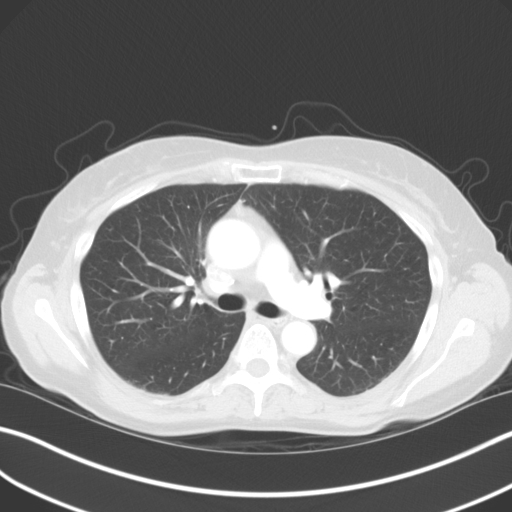
[im 46/65  mediastinal]
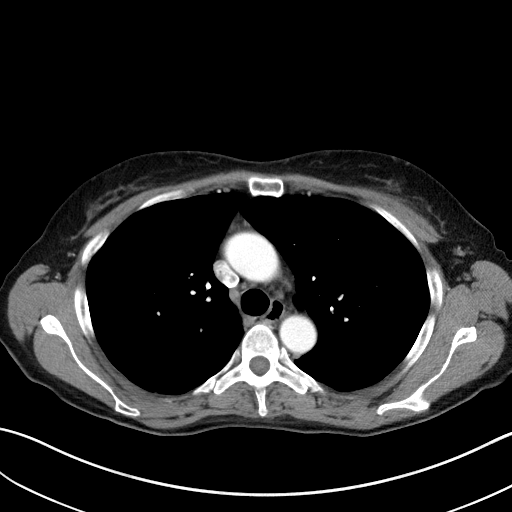
[im 46/65  lung]
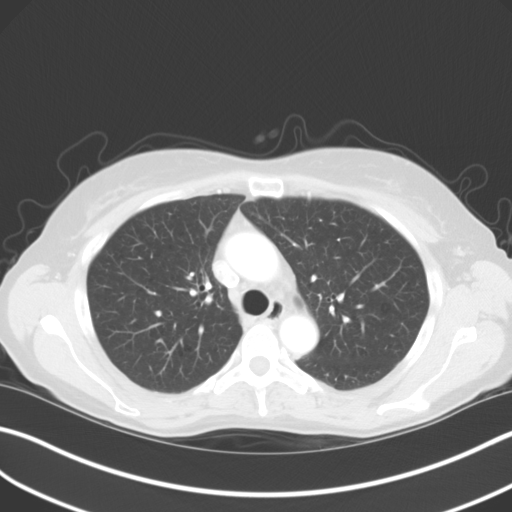
[im 50/65  lung]
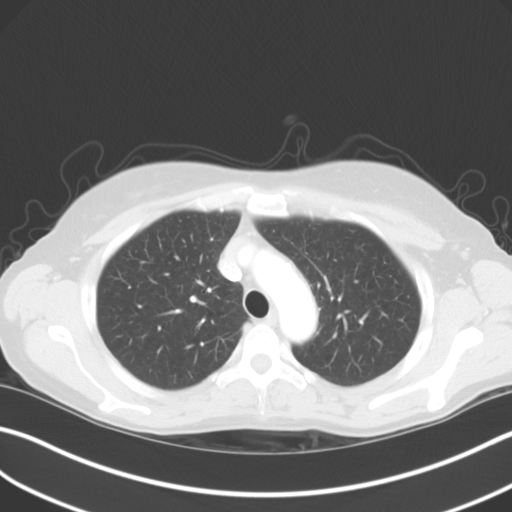
[im 55/65  lung]
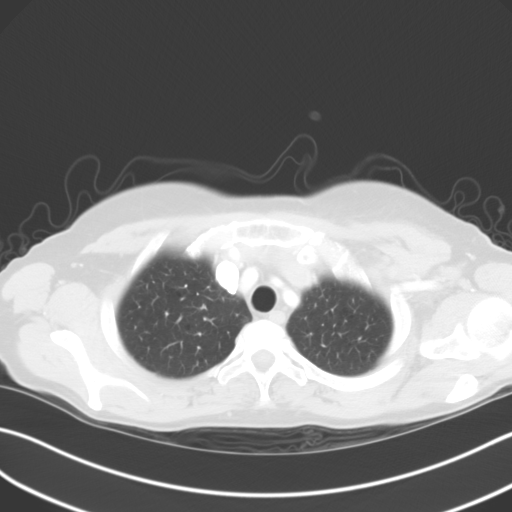
[im 60/65  lung]
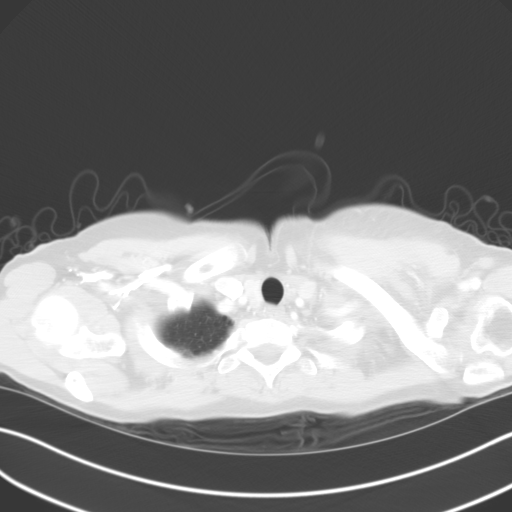

[Series 5: coronal · coronal · 0.63mm/px · 3 of 70 slices shown]
[im 14/70  lung]
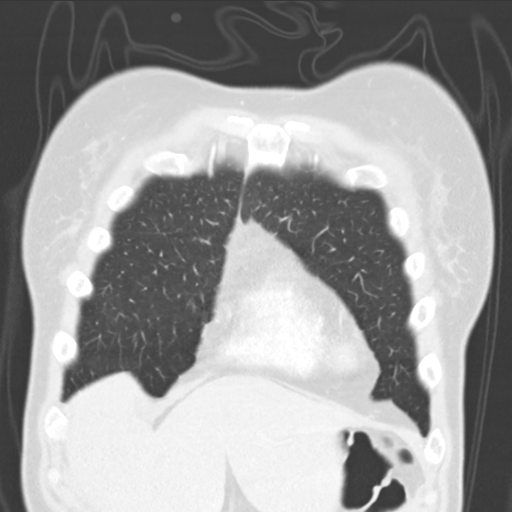
[im 28/70  lung]
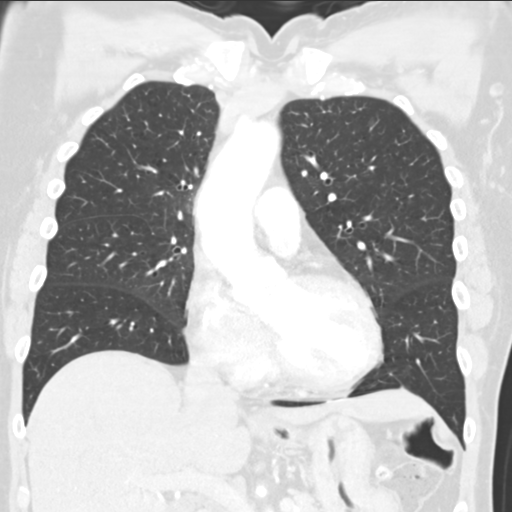
[im 42/70  lung]
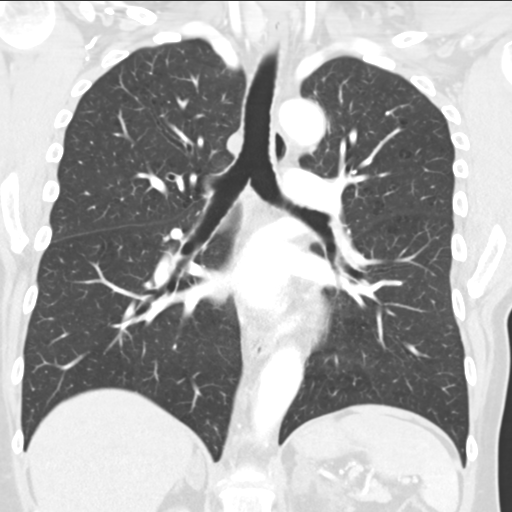

[15 of 36 positions shown; findings below may reference images not displayed]

FINDINGS: Lungs/Pleura: Mild centrilobular emphysema. Suspicion of a 4 mm
right lower lobe lung nodule on image 36 of series 3. No pleural
fluid.

Heart/Mediastinum: No supraclavicular adenopathy. Mild edema about
the left pectoralis musculature and within the subcutaneous tissues
of the upper left chest, including on image 9 of series 2. This is
consistent with the clinical history. No axillary adenopathy.
Tortuous thoracic aorta. Heart size upper normal, without
pericardial effusion. Study was not performed to evaluate for
pulmonary embolism. No central pulmonary embolism. No mediastinal or
hilar adenopathy.

Upper Abdomen: Too small to characterize left hepatic lobe lesion on
image 55. Too small to characterize posterior right hepatic lobe
lesion on image 58. Normal imaged portions of the spleen, stomach,
pancreas, kidneys. Both adrenal glands are thickened but maintain
their adreniform shape.

Bones/Musculoskeletal:  No acute osseous abnormality.
IMPRESSION: 1. No evidence of dominant thoracic mass. Left chest wall edema
which is likely related to the clinical history of left upper
extremity deep venous thrombosis.
2. Suspicion of a tiny right lower lobe pulmonary nodule. Given risk
factors for bronchogenic carcinoma, follow-up chest CT at 1 year is
recommended. This recommendation follows the consensus statement:
"Guidelines for Management of Small Pulmonary Nodules Detected on CT
Scans: A Statement from the [HOSPITAL]" as published in
[URL]
3. No central pulmonary embolism identified on this nondedicated
study.

## 2017-02-03 ENCOUNTER — Other Ambulatory Visit: Payer: Self-pay | Admitting: Family Medicine

## 2017-02-03 DIAGNOSIS — R911 Solitary pulmonary nodule: Secondary | ICD-10-CM

## 2017-02-16 ENCOUNTER — Other Ambulatory Visit: Payer: 59

## 2017-11-11 ENCOUNTER — Other Ambulatory Visit: Payer: Self-pay | Admitting: Dermatology

## 2018-02-16 ENCOUNTER — Other Ambulatory Visit (HOSPITAL_COMMUNITY)
Admission: RE | Admit: 2018-02-16 | Discharge: 2018-02-16 | Disposition: A | Discharge status: Home / Self Care | Payer: Medicare Other | Source: Ambulatory Visit | Attending: Family Medicine | Admitting: Family Medicine

## 2018-02-16 ENCOUNTER — Other Ambulatory Visit: Payer: Self-pay | Admitting: Family Medicine

## 2018-02-16 DIAGNOSIS — I10 Essential (primary) hypertension: Secondary | ICD-10-CM | POA: Diagnosis not present

## 2018-02-16 DIAGNOSIS — Z720 Tobacco use: Secondary | ICD-10-CM | POA: Diagnosis not present

## 2018-02-16 DIAGNOSIS — K219 Gastro-esophageal reflux disease without esophagitis: Secondary | ICD-10-CM | POA: Diagnosis not present

## 2018-02-16 DIAGNOSIS — Z1159 Encounter for screening for other viral diseases: Secondary | ICD-10-CM | POA: Diagnosis not present

## 2018-02-16 DIAGNOSIS — Z01419 Encounter for gynecological examination (general) (routine) without abnormal findings: Secondary | ICD-10-CM | POA: Diagnosis not present

## 2018-02-16 DIAGNOSIS — F322 Major depressive disorder, single episode, severe without psychotic features: Secondary | ICD-10-CM | POA: Diagnosis not present

## 2018-02-16 DIAGNOSIS — R911 Solitary pulmonary nodule: Secondary | ICD-10-CM | POA: Diagnosis not present

## 2018-02-16 DIAGNOSIS — R319 Hematuria, unspecified: Secondary | ICD-10-CM | POA: Diagnosis not present

## 2018-02-16 DIAGNOSIS — Z0000 Encounter for general adult medical examination without abnormal findings: Secondary | ICD-10-CM | POA: Diagnosis not present

## 2018-02-16 DIAGNOSIS — G4700 Insomnia, unspecified: Secondary | ICD-10-CM | POA: Diagnosis not present

## 2018-02-16 DIAGNOSIS — Z124 Encounter for screening for malignant neoplasm of cervix: Secondary | ICD-10-CM | POA: Diagnosis not present

## 2018-02-18 LAB — CYTOLOGY - PAP
ADEQUACY: ABSENT
Diagnosis: NEGATIVE
HPV: NOT DETECTED

## 2018-02-21 ENCOUNTER — Other Ambulatory Visit: Payer: Self-pay | Admitting: Acute Care

## 2018-02-21 DIAGNOSIS — Z122 Encounter for screening for malignant neoplasm of respiratory organs: Secondary | ICD-10-CM

## 2018-02-21 DIAGNOSIS — F17210 Nicotine dependence, cigarettes, uncomplicated: Secondary | ICD-10-CM

## 2018-03-02 ENCOUNTER — Ambulatory Visit (INDEPENDENT_AMBULATORY_CARE_PROVIDER_SITE_OTHER): Payer: Medicare Other | Admitting: Acute Care

## 2018-03-02 ENCOUNTER — Encounter: Payer: Self-pay | Admitting: Acute Care

## 2018-03-02 ENCOUNTER — Ambulatory Visit (INDEPENDENT_AMBULATORY_CARE_PROVIDER_SITE_OTHER)
Admission: RE | Admit: 2018-03-02 | Discharge: 2018-03-02 | Disposition: A | Discharge status: Home / Self Care | Payer: Medicare Other | Source: Ambulatory Visit | Attending: Acute Care | Admitting: Acute Care

## 2018-03-02 DIAGNOSIS — F17210 Nicotine dependence, cigarettes, uncomplicated: Secondary | ICD-10-CM | POA: Diagnosis not present

## 2018-03-02 DIAGNOSIS — Z122 Encounter for screening for malignant neoplasm of respiratory organs: Secondary | ICD-10-CM

## 2018-03-02 NOTE — Progress Notes (Signed)
Shared Decision Making Visit Lung Cancer Screening Program [phone])   Eligibility:  Age 67 y.o.  Pack Years Smoking History Calculation 37 pack year smoking history (# packs/per year x # years smoked)  Recent History of coughing up blood  no  Unexplained weight loss? no ( >Than 15 pounds within the last 6 months )  Prior History Lung / other cancer no (Diagnosis within the last 5 years already requiring surveillance chest CT Scans).  Smoking Status Current Smoker  Former Smokers: Years since quit: NA  Quit Date: NA  Visit Components:  Discussion included one or more decision making aids. yes  Discussion included risk/benefits of screening. yes  Discussion included potential follow up diagnostic testing for abnormal scans. yes  Discussion included meaning and risk of over diagnosis. yes  Discussion included meaning and risk of False Positives. yes  Discussion included meaning of total radiation exposure. yes  Counseling Included:  Importance of adherence to annual lung cancer LDCT screening. yes  Impact of comorbidities on ability to participate in the program. yes  Ability and willingness to under diagnostic treatment. yes  Smoking Cessation Counseling:  Current Smokers:   Discussed importance of smoking cessation. yes  Information about tobacco cessation classes and interventions provided to patient. yes  Patient provided with "ticket" for LDCT Scan. yes  Symptomatic Patient. no  Counseling  Diagnosis Code: Tobacco Use Z72.0  Asymptomatic Patient yes  Counseling (Intermediate counseling: > three minutes counseling) [id number]  Former Smokers:   Discussed the importance of maintaining cigarette abstinence. yes  Diagnosis Code: Personal History of Nicotine Dependence. M08.676  Information about tobacco cessation classes and interventions provided to patient. Yes  Patient provided with "ticket" for LDCT Scan. yes  Written Order for Lung Cancer  Screening with LDCT placed in Epic. Yes (CT Chest Lung Cancer Screening Low Dose W/O CM) [id number] Z12.2-Screening of respiratory organs Z87.891-Personal history of nicotine dependence  I have spent 25 minutes of face to face time with Tina Mccullough discussing the risks and benefits of lung cancer screening. We viewed a power point together that explained in detail the above noted topics. We paused at intervals to allow for questions to be asked and answered to ensure understanding.We discussed that the single most powerful action that she can take to decrease her risk of developing lung cancer is to quit smoking. We discussed whether or not she is ready to commit to setting a quit date. She is not ready to set a quit date. We discussed options for tools to aid in quitting smoking including nicotine replacement therapy, non-nicotine medications, support groups, Quit Smart classes, and behavior modification. We discussed that often times setting smaller, more achievable goals, such as eliminating 1 cigarette a day for a week and then 2 cigarettes a day for a week can be helpful in slowly decreasing the number of cigarettes smoked. This allows for a sense of accomplishment as well as providing a clinical benefit. I gave her the " Be Stronger Than Your Excuses" card with contact information for community resources, classes, free nicotine replacement therapy, and access to mobile apps, text messaging, and on-line smoking cessation help. I have also given her my card and contact information in the event she needs to contact me. We discussed the time and location of the scan, and that either Doroteo Glassman RN or I will call with the results within 24-48 hours of receiving them. I have offered her  a copy of the power point we viewed  as  a resource in the event they need reinforcement of the concepts we discussed today in the office. The patient verbalized understanding of all of  the above and had no further questions  upon leaving the office. They have my contact information in the event they have any further questions.  I spent 5 minutes counseling on smoking cessation and the health risks of continued tobacco abuse.  I explained to the patient that there has been a high incidence of coronary artery disease noted on these exams. I explained that this is a non-gated exam therefore degree or severity cannot be determined. This patient is not on statin therapy. I have asked the patient to follow-up with their PCP regarding any incidental finding of coronary artery disease and management with diet or medication as their PCP  feels is clinically indicated. The patient verbalized understanding of the above and had no further questions upon completion of the visit.      Magdalen Spatz, NP 03/02/2018 1:14 PM

## 2018-03-07 ENCOUNTER — Other Ambulatory Visit: Payer: Self-pay | Admitting: Acute Care

## 2018-03-07 DIAGNOSIS — Z87891 Personal history of nicotine dependence: Secondary | ICD-10-CM

## 2018-03-07 DIAGNOSIS — Z122 Encounter for screening for malignant neoplasm of respiratory organs: Secondary | ICD-10-CM

## 2018-03-07 DIAGNOSIS — F17210 Nicotine dependence, cigarettes, uncomplicated: Secondary | ICD-10-CM

## 2018-04-06 DIAGNOSIS — M8588 Other specified disorders of bone density and structure, other site: Secondary | ICD-10-CM | POA: Diagnosis not present

## 2018-04-06 DIAGNOSIS — Z780 Asymptomatic menopausal state: Secondary | ICD-10-CM | POA: Diagnosis not present

## 2018-05-09 DIAGNOSIS — R3129 Other microscopic hematuria: Secondary | ICD-10-CM | POA: Diagnosis not present

## 2018-06-09 DIAGNOSIS — R197 Diarrhea, unspecified: Secondary | ICD-10-CM | POA: Diagnosis not present

## 2018-06-11 ENCOUNTER — Encounter (HOSPITAL_COMMUNITY): Payer: Self-pay | Admitting: Emergency Medicine

## 2018-06-11 ENCOUNTER — Emergency Department (HOSPITAL_COMMUNITY)
Admission: EM | Admit: 2018-06-11 | Discharge: 2018-06-12 | Disposition: A | Discharge status: Home / Self Care | Payer: Medicare Other | Attending: Emergency Medicine | Admitting: Emergency Medicine

## 2018-06-11 ENCOUNTER — Other Ambulatory Visit: Payer: Self-pay

## 2018-06-11 DIAGNOSIS — I10 Essential (primary) hypertension: Secondary | ICD-10-CM | POA: Diagnosis not present

## 2018-06-11 DIAGNOSIS — R3121 Asymptomatic microscopic hematuria: Secondary | ICD-10-CM

## 2018-06-11 DIAGNOSIS — E860 Dehydration: Secondary | ICD-10-CM | POA: Diagnosis not present

## 2018-06-11 DIAGNOSIS — F17210 Nicotine dependence, cigarettes, uncomplicated: Secondary | ICD-10-CM | POA: Diagnosis not present

## 2018-06-11 DIAGNOSIS — Z79899 Other long term (current) drug therapy: Secondary | ICD-10-CM | POA: Diagnosis not present

## 2018-06-11 DIAGNOSIS — R197 Diarrhea, unspecified: Secondary | ICD-10-CM | POA: Diagnosis not present

## 2018-06-11 DIAGNOSIS — R000 Tachycardia, unspecified: Secondary | ICD-10-CM | POA: Diagnosis not present

## 2018-06-11 DIAGNOSIS — R319 Hematuria, unspecified: Secondary | ICD-10-CM | POA: Diagnosis not present

## 2018-06-11 DIAGNOSIS — R109 Unspecified abdominal pain: Secondary | ICD-10-CM | POA: Diagnosis not present

## 2018-06-11 LAB — COMPREHENSIVE METABOLIC PANEL
ALT: 10 U/L (ref 0–44)
AST: 14 U/L — ABNORMAL LOW (ref 15–41)
Albumin: 3.8 g/dL (ref 3.5–5.0)
Alkaline Phosphatase: 49 U/L (ref 38–126)
Anion gap: 8 (ref 5–15)
BUN: 13 mg/dL (ref 8–23)
CO2: 27 mmol/L (ref 22–32)
Calcium: 9.9 mg/dL (ref 8.9–10.3)
Chloride: 104 mmol/L (ref 98–111)
Creatinine, Ser: 0.89 mg/dL (ref 0.44–1.00)
GFR calc Af Amer: >60 mL/min (ref >60)
GFR calc non Af Amer: >60 mL/min (ref >60)
Glucose, Bld: 98 mg/dL (ref 70–99)
Potassium: 3.2 mmol/L — ABNORMAL LOW (ref 3.5–5.1)
Sodium: 139 mmol/L (ref 135–145)
TOTAL PROTEIN: 6.7 g/dL (ref 6.5–8.1)
Total Bilirubin: 0.2 mg/dL — ABNORMAL LOW (ref 0.3–1.2)

## 2018-06-11 LAB — CBC
HEMATOCRIT: 42 % (ref 36.0–46.0)
Hemoglobin: 14 g/dL (ref 12.0–15.0)
MCH: 29.5 pg (ref 26.0–34.0)
MCHC: 33.3 g/dL (ref 30.0–36.0)
MCV: 88.6 fL (ref 80.0–100.0)
NRBC: 0 % (ref 0.0–0.2)
Platelets: 491 10*3/uL — ABNORMAL HIGH (ref 150–400)
RBC: 4.74 MIL/uL (ref 3.87–5.11)
RDW: 13.6 % (ref 11.5–15.5)
WBC: 8.9 10*3/uL (ref 4.0–10.5)

## 2018-06-11 LAB — LIPASE, BLOOD: LIPASE: 28 U/L (ref 11–51)

## 2018-06-11 MED ORDER — SODIUM CHLORIDE 0.9 % IV BOLUS
1000.0000 mL | Freq: Once | INTRAVENOUS | Status: AC
  Administered 2018-06-11: 1000 mL via INTRAVENOUS

## 2018-06-11 MED ORDER — POTASSIUM CHLORIDE CRYS ER 20 MEQ PO TBCR
40.0000 meq | EXTENDED_RELEASE_TABLET | Freq: Once | ORAL | Status: AC
  Administered 2018-06-11: 40 meq via ORAL
  Filled 2018-06-11: qty 2

## 2018-06-11 NOTE — ED Provider Notes (Signed)
[facility] EMERGENCY DEPARTMENT Provider Note   CSN: [id number] Arrival date & time: 06/11/18  1528     History   Chief Complaint Chief Complaint  Patient presents with  . sent from urgent care  . Dehydration  . Diarrhea    HPI Tina Mccullough is a 68 y.o. female with a hx of HTN, anxiety, depression, DVT presents to the Emergency Department complaining of gradual, persistent, progressively worsening diarrhea onset 11 days ago (06/01/2018).  Pt reports diarrhea is 8-10 times per day, accompanied with large volume gas and some abdominal cramping.  Pt reports diarrhea is watery, brown in color and never more formed than the consistency of pudding.  Pt denies bloody diarrhea at any point. Pt denies international travel, antibiotics or sick contacts in the last 3 mos. patient reports she has taken Pepto-Bismol, Imodium and diphenoxylate/atropine without relief.  Patient reports she has some abdominal cramping just before episodes of diarrhea but otherwise has no abdominal pain.  She reports she is able to eat and drink without difficulty.  No fevers, chills, nausea or vomiting.  She denies headache, neck pain, chest pain, shortness of breath, weakness, dizziness, syncope, dysuria, hematuria.  She reports drinking Gatorade to help stay hydrated.  Abd surgeries limited to c-section.  Patient reports 4 pound weight loss in the last week.  Denies family history of Crohn's or ulcerative colitis.  Patient was seen at her primary care doctor on 06/09/2018 and GI pathogen panel was obtained.  It resulted yesterday and was negative.  Patient continues to have symptoms and was seen by urgent care earlier today who sent here here to the emergency department for rehydration.   The history is provided by the patient and medical records. No language interpreter was used.    Past Medical History:  Diagnosis Date  . Anxiety   . Depression   . DVT of axillary vein, acute left (HCC)      left subclavian vein  . Hypertension   . PONV (postoperative nausea and vomiting)     There are no active problems to display for this patient.   Past Surgical History:  Procedure Laterality Date  . ARTHROSCOPIC REPAIR ACL Left   . CESAREAN SECTION    . 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 HAND SURGERY Bilateral    for trigger fingers     OB History   No obstetric history on file.      Home Medications    Prior to Admission medications   Medication Sig Start Date End Date Taking? Authorizing Provider  acetaminophen (TYLENOL) 325 MG tablet Take 650 mg by mouth every 6 (six) hours as needed.    [provider]  cephALEXin (KEFLEX) 500 MG capsule Take 1 capsule (500 mg total) by mouth 4 (four) times daily. 11/28/14   Jannette Fogo, DDS  diphenhydrAMINE (BENADRYL) 25 mg capsule Take 25 mg by mouth every 6 (six) hours as needed. Allergies    [provider]  EPINEPHrine (EPIPEN) 0.3 mg/0.3 mL DEVI Inject 0.3 mLs (0.3 mg total) into the muscle as needed. 07/05/12   Paz, Clent Demark, PA-C  hydrochlorothiazide (HYDRODIURIL) 25 MG tablet Take 25 mg by mouth daily.    [provider]  HYDROcodone-acetaminophen (NORCO) 5-325 MG per tablet Take 1 tablet by mouth every  6 (six) hours as needed for moderate pain. 11/28/14   Jannette Fogo, DDS  rivaroxaban (XARELTO) 20 MG TABS tablet Take 20 mg by mouth daily with supper.    [provider]  sertraline (ZOLOFT) 25 MG tablet Take 25 mg by mouth daily.    [provider]    Family History No family history on file.  Social History Social History   Tobacco Use  . Smoking status: Current Every Day Smoker    Packs/day: 1.00    Years: 37.00    Pack years: 37.00    Types: E-cigarettes  . Smokeless tobacco: Never  Used  . Tobacco comment: Counseled on reduce to quit. Information on classes shared  Substance Use Topics  . Alcohol use: No  . Drug use: No     Allergies   Patient has no known allergies.   Review of Systems Review of Systems  Constitutional: Negative for appetite change, diaphoresis, fatigue, fever and unexpected weight change.  HENT: Negative for mouth sores.   Eyes: Negative for visual disturbance.  Respiratory: Negative for cough, chest tightness, shortness of breath and wheezing.   Cardiovascular: Negative for chest pain.  Gastrointestinal: Positive for abdominal pain ( Intermittent cramping) and diarrhea. Negative for constipation, nausea and vomiting.  Endocrine: Negative for polydipsia, polyphagia and polyuria.  Genitourinary: Negative for dysuria, frequency, hematuria and urgency.  Musculoskeletal: Negative for back pain and neck stiffness.  Skin: Negative for rash.  Allergic/Immunologic: Negative for immunocompromised state.  Neurological: Negative for syncope, light-headedness and headaches.  Hematological: Does not bruise/bleed easily.  Psychiatric/Behavioral: Negative for sleep disturbance. The patient is not nervous/anxious.      Physical Exam Updated Vital Signs BP 140/87 (BP Location: Left Arm)   Pulse 84   Temp 97.9 F (36.6 C) (Oral)   Resp 16   Ht 5\' 6"  (1.676 m)   Wt 60.3 kg   SpO2 99%   BMI 21.47 kg/m   Physical Exam Vitals signs and nursing note reviewed.  Constitutional:      General: She is not in acute distress.    Appearance: She is well-developed. She is not diaphoretic.     Comments: Awake, alert, nontoxic appearance  HENT:     Head: Normocephalic and atraumatic.     Mouth/Throat:     Mouth: Mucous membranes are dry.     Pharynx: No oropharyngeal exudate.     Comments: Mucous membranes are slightly dry Eyes:     General: No scleral icterus.    Conjunctiva/sclera: Conjunctivae normal.  Neck:     Musculoskeletal: Normal range of  motion and neck supple.  Cardiovascular:     Rate and Rhythm: Regular rhythm. Tachycardia present.     Comments: Mild tachycardia Pulmonary:     Effort: Pulmonary effort is normal. No respiratory distress.     Breath sounds: Normal breath sounds. No wheezing.  Abdominal:     General: Bowel sounds are normal.     Palpations: Abdomen is soft. There is no mass.     Tenderness: There is no abdominal tenderness. There is no guarding or rebound.  Musculoskeletal: Normal range of motion.  Skin:    General: Skin is warm and dry.  Neurological:     Mental Status: She is alert.     Comments: Speech is clear and goal oriented Moves extremities without ataxia      ED Treatments / Results  Labs (all labs ordered are listed, but only abnormal results are displayed) Labs Reviewed  COMPREHENSIVE  METABOLIC PANEL - Abnormal; Notable for the following components:      Result Value   Potassium 3.2 (*)    AST 14 (*)    Total Bilirubin 0.2 (*)    All other components within normal limits  CBC - Abnormal; Notable for the following components:   Platelets 491 (*)    All other components within normal limits  URINALYSIS, ROUTINE W REFLEX MICROSCOPIC - Abnormal; Notable for the following components:   Hgb urine dipstick MODERATE (*)    Ketones, ur 5 (*)    Bacteria, UA RARE (*)    All other components within normal limits  OVA + PARASITE EXAM  LIPASE, BLOOD    EKG None  Radiology No results found.  Procedures Procedures (including critical care time)  Medications Ordered in ED Medications  sodium chloride 0.9 % bolus 1,000 mL (0 mLs Intravenous Stopped 06/12/18 0054)  potassium chloride SA (K-DUR,KLOR-CON) CR tablet 40 mEq (40 mEq Oral Given 06/11/18 2342)     Initial Impression / Assessment and Plan / ED Course  I have reviewed the triage vital signs and the nursing notes.  Pertinent labs & imaging results that were available during my care of the patient were reviewed by me and  considered in my medical decision making (see chart for details).  Clinical Course as of Jun 12 148  Sat Jun 11, 2018  2240 Mild hypokalemia  Potassium(!): 3.2 [HM]  2240 Tachycardic on arrival, no hypotension  Pulse Rate(!): 110 [HM]  2240 afebrile  Temp: 97.9 F (36.6 C) [HM]  Sun Jun 12, 2018  0011 Additional discussion with patient.  At this time she is refusing CT scan.  She reports that she has already had several CT scans in the last few years and does not want one today.  She is willing to provide stool sample.   [HM]  0012 Abd remains soft and nontender   [HM]    Clinical Course User Index [HM] Regla Fitzgibbon, Jarrett Soho, PA-C   Patient presents with persistent diarrhea for the last 7 days.  GI pathogen panel negative.  Will obtain O&P as a more likely to be parasitic at this time with negative pathogen panel.  We will also obtain CT scan for further evaluation.  Will rehydrate with fluids.  Mild hypokalemia is noted but no other electrolyte abnormalities.  Abdomen soft and nontender on my initial exam.  1:50 AM Patient continues to be well-appearing.  Abdomen remains soft and nontender.  Urinalysis does show microscopic hematuria.  Patient reports this is normal for her and she is followed by urology for this.  Discussed conservative therapies at home for her persistent diarrhea.  She is to follow with gastroenterology.  She is to return here to the emergency department for development of fevers, worsening diarrhea, bloody diarrhea or abdominal pain.  Patient and husband state understanding and are in agreement with the plan.  The patient was discussed with and seen by Dr. Reather Converse who agrees with the treatment plan.   Final Clinical Impressions(s) / ED Diagnoses   Final diagnoses:  Diarrhea, unspecified type  Asymptomatic microscopic hematuria    ED Discharge Orders    None       Loni Muse Gwenlyn Perking 06/12/18 0150    Elnora Morrison, MD 06/20/18 [phone]

## 2018-06-11 NOTE — ED Triage Notes (Signed)
Has been having diarrhea for 11 days- seen at regular doctor and had stool sample with results on Friday-- no c diff. Taking immodium, lomotil, no relief - Having 8-12 loose stools a day if eating-  Weight loss of 4 pounds in past week.

## 2018-06-12 DIAGNOSIS — R197 Diarrhea, unspecified: Secondary | ICD-10-CM | POA: Diagnosis not present

## 2018-06-12 LAB — URINALYSIS, ROUTINE W REFLEX MICROSCOPIC
Bilirubin Urine: NEGATIVE
GLUCOSE, UA: NEGATIVE mg/dL
Ketones, ur: 5 mg/dL — AB
Leukocytes, UA: NEGATIVE
Nitrite: NEGATIVE
Protein, ur: NEGATIVE mg/dL
Specific Gravity, Urine: 1.014 (ref 1.005–1.030)
pH: 6 (ref 5.0–8.0)

## 2018-06-12 NOTE — ED Notes (Signed)
Patient verbalizes understanding of discharge instructions. Opportunity for questioning and answers were provided. Armband removed by staff, pt discharged from ED.  

## 2018-06-12 NOTE — Discharge Instructions (Addendum)
1. Medications: continue prescribed medications 2. Treatment: rest, drink plenty of fluids, continue Gatorade usage 3. Follow Up: Please followup with your primary doctor in 2-3 days for discussion of your diagnoses and further evaluation after today's visit; if you do not have a primary care doctor use the resource guide provided to find one; Please return to the ER for any symptoms, feelings of dehydration, passing out, diarrhea that is bloody or development of abdominal pain.

## 2018-06-15 DIAGNOSIS — R197 Diarrhea, unspecified: Secondary | ICD-10-CM | POA: Diagnosis not present

## 2018-06-15 DIAGNOSIS — E876 Hypokalemia: Secondary | ICD-10-CM | POA: Diagnosis not present

## 2018-06-15 LAB — OVA + PARASITE EXAM

## 2018-06-15 LAB — O&P RESULT

## 2018-06-22 ENCOUNTER — Other Ambulatory Visit: Payer: Self-pay | Admitting: Physician Assistant

## 2018-06-22 DIAGNOSIS — R197 Diarrhea, unspecified: Secondary | ICD-10-CM | POA: Diagnosis not present

## 2018-06-22 DIAGNOSIS — R1030 Lower abdominal pain, unspecified: Secondary | ICD-10-CM | POA: Diagnosis not present

## 2018-06-24 ENCOUNTER — Ambulatory Visit
Admission: RE | Admit: 2018-06-24 | Discharge: 2018-06-24 | Disposition: A | Discharge status: Home / Self Care | Payer: Medicare Other | Source: Ambulatory Visit | Attending: Physician Assistant | Admitting: Physician Assistant

## 2018-06-24 DIAGNOSIS — D259 Leiomyoma of uterus, unspecified: Secondary | ICD-10-CM | POA: Diagnosis not present

## 2018-06-24 DIAGNOSIS — R197 Diarrhea, unspecified: Secondary | ICD-10-CM

## 2018-06-24 DIAGNOSIS — R1030 Lower abdominal pain, unspecified: Secondary | ICD-10-CM

## 2018-06-24 MED ORDER — IOPAMIDOL (ISOVUE-300) INJECTION 61%
100.0000 mL | Freq: Once | INTRAVENOUS | Status: AC | PRN
  Administered 2018-06-24: 100 mL via INTRAVENOUS

## 2018-06-24 MED ORDER — IOPAMIDOL (ISOVUE-300) INJECTION 61%: 100.0000 mL | Freq: Once | INTRAVENOUS | Status: DC | PRN

## 2018-07-08 DIAGNOSIS — K621 Rectal polyp: Secondary | ICD-10-CM | POA: Diagnosis not present

## 2018-07-08 DIAGNOSIS — D128 Benign neoplasm of rectum: Secondary | ICD-10-CM | POA: Diagnosis not present

## 2018-07-08 DIAGNOSIS — K52832 Lymphocytic colitis: Secondary | ICD-10-CM | POA: Diagnosis not present

## 2018-07-08 DIAGNOSIS — K5730 Diverticulosis of large intestine without perforation or abscess without bleeding: Secondary | ICD-10-CM | POA: Diagnosis not present

## 2018-07-08 DIAGNOSIS — K529 Noninfective gastroenteritis and colitis, unspecified: Secondary | ICD-10-CM | POA: Diagnosis not present

## 2018-07-12 DIAGNOSIS — K621 Rectal polyp: Secondary | ICD-10-CM | POA: Diagnosis not present

## 2018-07-12 DIAGNOSIS — K52832 Lymphocytic colitis: Secondary | ICD-10-CM | POA: Diagnosis not present

## 2018-07-12 DIAGNOSIS — D128 Benign neoplasm of rectum: Secondary | ICD-10-CM | POA: Diagnosis not present

## 2018-07-22 DIAGNOSIS — K52832 Lymphocytic colitis: Secondary | ICD-10-CM | POA: Diagnosis not present

## 2018-07-22 DIAGNOSIS — Z86010 Personal history of colonic polyps: Secondary | ICD-10-CM | POA: Diagnosis not present

## 2018-11-28 DIAGNOSIS — K52832 Lymphocytic colitis: Secondary | ICD-10-CM | POA: Diagnosis not present

## 2019-03-01 DIAGNOSIS — R319 Hematuria, unspecified: Secondary | ICD-10-CM | POA: Diagnosis not present

## 2019-03-01 DIAGNOSIS — I10 Essential (primary) hypertension: Secondary | ICD-10-CM | POA: Diagnosis not present

## 2019-03-03 DIAGNOSIS — Z720 Tobacco use: Secondary | ICD-10-CM | POA: Diagnosis not present

## 2019-03-03 DIAGNOSIS — I10 Essential (primary) hypertension: Secondary | ICD-10-CM | POA: Diagnosis not present

## 2019-03-03 DIAGNOSIS — K219 Gastro-esophageal reflux disease without esophagitis: Secondary | ICD-10-CM | POA: Diagnosis not present

## 2019-03-03 DIAGNOSIS — M545 Low back pain: Secondary | ICD-10-CM | POA: Diagnosis not present

## 2019-03-03 DIAGNOSIS — R319 Hematuria, unspecified: Secondary | ICD-10-CM | POA: Diagnosis not present

## 2019-03-03 DIAGNOSIS — Z7189 Other specified counseling: Secondary | ICD-10-CM | POA: Diagnosis not present

## 2019-03-03 DIAGNOSIS — F322 Major depressive disorder, single episode, severe without psychotic features: Secondary | ICD-10-CM | POA: Diagnosis not present

## 2019-03-03 DIAGNOSIS — R911 Solitary pulmonary nodule: Secondary | ICD-10-CM | POA: Diagnosis not present

## 2019-03-03 DIAGNOSIS — Z0000 Encounter for general adult medical examination without abnormal findings: Secondary | ICD-10-CM | POA: Diagnosis not present

## 2019-03-16 ENCOUNTER — Ambulatory Visit (INDEPENDENT_AMBULATORY_CARE_PROVIDER_SITE_OTHER)
Admission: RE | Admit: 2019-03-16 | Discharge: 2019-03-16 | Disposition: A | Discharge status: Home / Self Care | Payer: Medicare Other | Source: Ambulatory Visit | Attending: Acute Care | Admitting: Acute Care

## 2019-03-16 ENCOUNTER — Encounter (INDEPENDENT_AMBULATORY_CARE_PROVIDER_SITE_OTHER): Payer: Self-pay

## 2019-03-16 ENCOUNTER — Other Ambulatory Visit: Payer: Self-pay

## 2019-03-16 DIAGNOSIS — F17200 Nicotine dependence, unspecified, uncomplicated: Secondary | ICD-10-CM

## 2019-03-16 DIAGNOSIS — Z87891 Personal history of nicotine dependence: Secondary | ICD-10-CM

## 2019-03-16 DIAGNOSIS — Z122 Encounter for screening for malignant neoplasm of respiratory organs: Secondary | ICD-10-CM | POA: Diagnosis not present

## 2019-03-16 DIAGNOSIS — F17210 Nicotine dependence, cigarettes, uncomplicated: Secondary | ICD-10-CM

## 2019-03-20 ENCOUNTER — Other Ambulatory Visit: Payer: Self-pay | Admitting: *Deleted

## 2019-03-20 DIAGNOSIS — Z87891 Personal history of nicotine dependence: Secondary | ICD-10-CM

## 2019-03-20 DIAGNOSIS — F17210 Nicotine dependence, cigarettes, uncomplicated: Secondary | ICD-10-CM

## 2019-03-20 DIAGNOSIS — Z122 Encounter for screening for malignant neoplasm of respiratory organs: Secondary | ICD-10-CM

## 2020-02-29 ENCOUNTER — Other Ambulatory Visit: Payer: Self-pay | Admitting: Physician Assistant

## 2020-03-06 ENCOUNTER — Other Ambulatory Visit: Payer: Self-pay | Admitting: Physician Assistant

## 2020-03-18 DIAGNOSIS — J440 Chronic obstructive pulmonary disease with acute lower respiratory infection: Secondary | ICD-10-CM | POA: Diagnosis not present

## 2020-03-18 DIAGNOSIS — D3500 Benign neoplasm of unspecified adrenal gland: Secondary | ICD-10-CM | POA: Diagnosis not present

## 2020-03-18 DIAGNOSIS — R911 Solitary pulmonary nodule: Secondary | ICD-10-CM | POA: Diagnosis not present

## 2020-03-18 DIAGNOSIS — M545 Low back pain, unspecified: Secondary | ICD-10-CM | POA: Diagnosis not present

## 2020-03-18 DIAGNOSIS — D259 Leiomyoma of uterus, unspecified: Secondary | ICD-10-CM | POA: Diagnosis not present

## 2020-03-18 DIAGNOSIS — K219 Gastro-esophageal reflux disease without esophagitis: Secondary | ICD-10-CM | POA: Diagnosis not present

## 2020-03-18 DIAGNOSIS — Z0000 Encounter for general adult medical examination without abnormal findings: Secondary | ICD-10-CM | POA: Diagnosis not present

## 2020-03-18 DIAGNOSIS — I700 Atherosclerosis of aorta: Secondary | ICD-10-CM | POA: Diagnosis not present

## 2020-03-18 DIAGNOSIS — N8110 Cystocele, unspecified: Secondary | ICD-10-CM | POA: Diagnosis not present

## 2020-03-18 DIAGNOSIS — M8580 Other specified disorders of bone density and structure, unspecified site: Secondary | ICD-10-CM | POA: Diagnosis not present

## 2020-03-18 DIAGNOSIS — Z720 Tobacco use: Secondary | ICD-10-CM | POA: Diagnosis not present

## 2020-03-18 DIAGNOSIS — I10 Essential (primary) hypertension: Secondary | ICD-10-CM | POA: Diagnosis not present

## 2020-03-18 DIAGNOSIS — R319 Hematuria, unspecified: Secondary | ICD-10-CM | POA: Diagnosis not present

## 2020-04-01 ENCOUNTER — Inpatient Hospital Stay: Admission: RE | Admit: 2020-04-01 | Payer: Medicare Other | Source: Ambulatory Visit

## 2020-04-02 DIAGNOSIS — H00021 Hordeolum internum right upper eyelid: Secondary | ICD-10-CM | POA: Diagnosis not present

## 2020-04-02 DIAGNOSIS — H00031 Abscess of right upper eyelid: Secondary | ICD-10-CM | POA: Diagnosis not present

## 2020-04-03 DIAGNOSIS — N814 Uterovaginal prolapse, unspecified: Secondary | ICD-10-CM | POA: Diagnosis not present

## 2020-04-05 DIAGNOSIS — N390 Urinary tract infection, site not specified: Secondary | ICD-10-CM | POA: Diagnosis not present

## 2020-05-01 ENCOUNTER — Ambulatory Visit: Payer: Medicare Other | Admitting: Dermatology

## 2020-06-11 ENCOUNTER — Encounter: Payer: Self-pay | Admitting: Dermatology

## 2020-06-11 ENCOUNTER — Other Ambulatory Visit: Payer: Self-pay

## 2020-06-11 ENCOUNTER — Ambulatory Visit (INDEPENDENT_AMBULATORY_CARE_PROVIDER_SITE_OTHER): Payer: Medicare Other | Admitting: Dermatology

## 2020-06-11 DIAGNOSIS — L309 Dermatitis, unspecified: Secondary | ICD-10-CM | POA: Diagnosis not present

## 2020-06-11 MED ORDER — CLOBETASOL PROPIONATE 0.05 % EX SOLN: 1.0000 "application " | Freq: Two times a day (BID) | CUTANEOUS | 0 refills | Status: DC

## 2020-06-14 ENCOUNTER — Telehealth: Payer: Self-pay | Admitting: *Deleted

## 2020-06-14 NOTE — Telephone Encounter (Signed)
Prior authorization done via cover my meds for patients clobetasol. Waiting for determination.

## 2020-06-14 NOTE — Telephone Encounter (Signed)
Prior Authorization approved for patients Clobetasol.

## 2020-06-17 ENCOUNTER — Encounter: Payer: Self-pay | Admitting: Dermatology

## 2020-06-17 NOTE — Progress Notes (Signed)
   Follow-Up Visit   Subjective  Tina Mccullough is a 70 y.o. female who presents for the following: Dermatitis (Scalp only flake and itch patient needs refills, ketoconazole and betamethasone lotion or clobetasol has worked in the past last ov 2019).  Rash Location: Scalp Duration: Years Quality:  Associated Signs/Symptoms: Itch, often severe Modifying Factors:  Severity:  Timing: Context:   Objective  Well appearing patient in no apparent distress; mood and affect are within normal limits. 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  No changes in ears, nails.  No other cutaneous findings of psoriasis.    All sun exposed areas plus back examined.  Plus scalp, ears, nails.   Assessment & Plan    Dermatitis Scalp  Clobetasol solution applied gently daily for maximum of 30 days to itchy areas scalp.  Avoid use on face and body folds.  Discussed strategies to minimize scratching.  Ordered Medications: clobetasol (TEMOVATE) 0.05 % external solution      I, Lavonna Monarch, MD, have reviewed all documentation for this visit.  The documentation on 06/17/20 for the exam, diagnosis, procedures, and orders are all accurate and complete.

## 2020-07-09 DIAGNOSIS — N814 Uterovaginal prolapse, unspecified: Secondary | ICD-10-CM | POA: Diagnosis not present

## 2020-07-09 DIAGNOSIS — Z4689 Encounter for fitting and adjustment of other specified devices: Secondary | ICD-10-CM | POA: Diagnosis not present

## 2020-08-19 DIAGNOSIS — Z4689 Encounter for fitting and adjustment of other specified devices: Secondary | ICD-10-CM | POA: Diagnosis not present

## 2020-08-19 DIAGNOSIS — N814 Uterovaginal prolapse, unspecified: Secondary | ICD-10-CM | POA: Diagnosis not present

## 2020-09-03 DIAGNOSIS — M25461 Effusion, right knee: Secondary | ICD-10-CM | POA: Diagnosis not present

## 2020-09-11 DIAGNOSIS — N814 Uterovaginal prolapse, unspecified: Secondary | ICD-10-CM | POA: Diagnosis not present

## 2020-09-11 DIAGNOSIS — Z4689 Encounter for fitting and adjustment of other specified devices: Secondary | ICD-10-CM | POA: Diagnosis not present

## 2020-09-30 DIAGNOSIS — M25561 Pain in right knee: Secondary | ICD-10-CM | POA: Diagnosis not present

## 2020-10-10 DIAGNOSIS — Z4689 Encounter for fitting and adjustment of other specified devices: Secondary | ICD-10-CM | POA: Diagnosis not present

## 2020-10-11 DIAGNOSIS — M25561 Pain in right knee: Secondary | ICD-10-CM | POA: Diagnosis not present

## 2020-12-19 ENCOUNTER — Other Ambulatory Visit: Payer: Self-pay | Admitting: Dermatology

## 2021-01-13 DIAGNOSIS — Z4689 Encounter for fitting and adjustment of other specified devices: Secondary | ICD-10-CM | POA: Diagnosis not present

## 2021-03-25 DIAGNOSIS — M8580 Other specified disorders of bone density and structure, unspecified site: Secondary | ICD-10-CM | POA: Diagnosis not present

## 2021-03-25 DIAGNOSIS — R319 Hematuria, unspecified: Secondary | ICD-10-CM | POA: Diagnosis not present

## 2021-03-25 DIAGNOSIS — D259 Leiomyoma of uterus, unspecified: Secondary | ICD-10-CM | POA: Diagnosis not present

## 2021-03-25 DIAGNOSIS — Z0000 Encounter for general adult medical examination without abnormal findings: Secondary | ICD-10-CM | POA: Diagnosis not present

## 2021-03-25 DIAGNOSIS — R911 Solitary pulmonary nodule: Secondary | ICD-10-CM | POA: Diagnosis not present

## 2021-03-25 DIAGNOSIS — N8110 Cystocele, unspecified: Secondary | ICD-10-CM | POA: Diagnosis not present

## 2021-03-25 DIAGNOSIS — Z960 Presence of urogenital implants: Secondary | ICD-10-CM | POA: Diagnosis not present

## 2021-03-25 DIAGNOSIS — J440 Chronic obstructive pulmonary disease with acute lower respiratory infection: Secondary | ICD-10-CM | POA: Diagnosis not present

## 2021-03-25 DIAGNOSIS — D3500 Benign neoplasm of unspecified adrenal gland: Secondary | ICD-10-CM | POA: Diagnosis not present

## 2021-03-25 DIAGNOSIS — I10 Essential (primary) hypertension: Secondary | ICD-10-CM | POA: Diagnosis not present

## 2021-03-25 DIAGNOSIS — I700 Atherosclerosis of aorta: Secondary | ICD-10-CM | POA: Diagnosis not present

## 2021-03-25 DIAGNOSIS — Z720 Tobacco use: Secondary | ICD-10-CM | POA: Diagnosis not present

## 2021-05-07 ENCOUNTER — Other Ambulatory Visit: Payer: Self-pay | Admitting: *Deleted

## 2021-05-07 DIAGNOSIS — Z87891 Personal history of nicotine dependence: Secondary | ICD-10-CM

## 2021-05-07 DIAGNOSIS — Z1231 Encounter for screening mammogram for malignant neoplasm of breast: Secondary | ICD-10-CM | POA: Diagnosis not present

## 2021-05-07 DIAGNOSIS — Z780 Asymptomatic menopausal state: Secondary | ICD-10-CM | POA: Diagnosis not present

## 2021-05-07 DIAGNOSIS — F17210 Nicotine dependence, cigarettes, uncomplicated: Secondary | ICD-10-CM

## 2021-05-09 DIAGNOSIS — R319 Hematuria, unspecified: Secondary | ICD-10-CM | POA: Diagnosis not present

## 2021-05-09 DIAGNOSIS — Z4689 Encounter for fitting and adjustment of other specified devices: Secondary | ICD-10-CM | POA: Diagnosis not present

## 2021-05-29 ENCOUNTER — Ambulatory Visit: Payer: Medicare Other

## 2021-06-05 ENCOUNTER — Ambulatory Visit: Payer: Medicare Other

## 2021-07-09 DIAGNOSIS — H43812 Vitreous degeneration, left eye: Secondary | ICD-10-CM | POA: Diagnosis not present

## 2021-09-11 DIAGNOSIS — Z4689 Encounter for fitting and adjustment of other specified devices: Secondary | ICD-10-CM | POA: Diagnosis not present

## 2021-10-15 DIAGNOSIS — H43812 Vitreous degeneration, left eye: Secondary | ICD-10-CM | POA: Diagnosis not present

## 2021-10-15 DIAGNOSIS — H524 Presbyopia: Secondary | ICD-10-CM | POA: Diagnosis not present

## 2021-10-15 DIAGNOSIS — H25813 Combined forms of age-related cataract, bilateral: Secondary | ICD-10-CM | POA: Diagnosis not present

## 2021-10-21 DIAGNOSIS — M79645 Pain in left finger(s): Secondary | ICD-10-CM | POA: Diagnosis not present

## 2021-10-21 DIAGNOSIS — S63635A Sprain of interphalangeal joint of left ring finger, initial encounter: Secondary | ICD-10-CM | POA: Diagnosis not present

## 2021-11-06 DIAGNOSIS — M79645 Pain in left finger(s): Secondary | ICD-10-CM | POA: Diagnosis not present

## 2021-12-04 DIAGNOSIS — M79645 Pain in left finger(s): Secondary | ICD-10-CM | POA: Diagnosis not present

## 2021-12-04 DIAGNOSIS — M654 Radial styloid tenosynovitis [de Quervain]: Secondary | ICD-10-CM | POA: Diagnosis not present

## 2022-01-01 DIAGNOSIS — M654 Radial styloid tenosynovitis [de Quervain]: Secondary | ICD-10-CM | POA: Diagnosis not present

## 2022-01-15 DIAGNOSIS — Z4689 Encounter for fitting and adjustment of other specified devices: Secondary | ICD-10-CM | POA: Diagnosis not present

## 2022-01-29 DIAGNOSIS — M79645 Pain in left finger(s): Secondary | ICD-10-CM | POA: Diagnosis not present

## 2022-01-29 DIAGNOSIS — M654 Radial styloid tenosynovitis [de Quervain]: Secondary | ICD-10-CM | POA: Diagnosis not present

## 2022-03-19 DIAGNOSIS — M67432 Ganglion, left wrist: Secondary | ICD-10-CM | POA: Diagnosis not present

## 2022-04-03 ENCOUNTER — Other Ambulatory Visit: Payer: Self-pay | Admitting: Family Medicine

## 2022-04-03 DIAGNOSIS — Z720 Tobacco use: Secondary | ICD-10-CM

## 2022-04-03 DIAGNOSIS — M8580 Other specified disorders of bone density and structure, unspecified site: Secondary | ICD-10-CM | POA: Diagnosis not present

## 2022-04-03 DIAGNOSIS — I700 Atherosclerosis of aorta: Secondary | ICD-10-CM | POA: Diagnosis not present

## 2022-04-03 DIAGNOSIS — N8110 Cystocele, unspecified: Secondary | ICD-10-CM | POA: Diagnosis not present

## 2022-04-03 DIAGNOSIS — R319 Hematuria, unspecified: Secondary | ICD-10-CM | POA: Diagnosis not present

## 2022-04-03 DIAGNOSIS — Z960 Presence of urogenital implants: Secondary | ICD-10-CM | POA: Diagnosis not present

## 2022-04-03 DIAGNOSIS — Z86718 Personal history of other venous thrombosis and embolism: Secondary | ICD-10-CM | POA: Diagnosis not present

## 2022-04-03 DIAGNOSIS — I10 Essential (primary) hypertension: Secondary | ICD-10-CM | POA: Diagnosis not present

## 2022-04-03 DIAGNOSIS — D259 Leiomyoma of uterus, unspecified: Secondary | ICD-10-CM | POA: Diagnosis not present

## 2022-04-03 DIAGNOSIS — J440 Chronic obstructive pulmonary disease with acute lower respiratory infection: Secondary | ICD-10-CM | POA: Diagnosis not present

## 2022-04-03 DIAGNOSIS — D3500 Benign neoplasm of unspecified adrenal gland: Secondary | ICD-10-CM | POA: Diagnosis not present

## 2022-04-03 DIAGNOSIS — Z0000 Encounter for general adult medical examination without abnormal findings: Secondary | ICD-10-CM | POA: Diagnosis not present

## 2022-04-10 DIAGNOSIS — M654 Radial styloid tenosynovitis [de Quervain]: Secondary | ICD-10-CM | POA: Diagnosis not present

## 2022-04-17 DIAGNOSIS — M654 Radial styloid tenosynovitis [de Quervain]: Secondary | ICD-10-CM | POA: Diagnosis not present

## 2022-07-03 DIAGNOSIS — Z4689 Encounter for fitting and adjustment of other specified devices: Secondary | ICD-10-CM | POA: Diagnosis not present

## 2022-11-10 DIAGNOSIS — Z01419 Encounter for gynecological examination (general) (routine) without abnormal findings: Secondary | ICD-10-CM | POA: Diagnosis not present

## 2022-11-10 DIAGNOSIS — Z4689 Encounter for fitting and adjustment of other specified devices: Secondary | ICD-10-CM | POA: Diagnosis not present

## 2022-11-10 DIAGNOSIS — N841 Polyp of cervix uteri: Secondary | ICD-10-CM | POA: Diagnosis not present

## 2023-02-04 DIAGNOSIS — H25813 Combined forms of age-related cataract, bilateral: Secondary | ICD-10-CM | POA: Diagnosis not present

## 2023-02-04 DIAGNOSIS — H02831 Dermatochalasis of right upper eyelid: Secondary | ICD-10-CM | POA: Diagnosis not present

## 2023-02-04 DIAGNOSIS — H02834 Dermatochalasis of left upper eyelid: Secondary | ICD-10-CM | POA: Diagnosis not present

## 2023-02-04 DIAGNOSIS — H11153 Pinguecula, bilateral: Secondary | ICD-10-CM | POA: Diagnosis not present

## 2023-04-07 DIAGNOSIS — J440 Chronic obstructive pulmonary disease with acute lower respiratory infection: Secondary | ICD-10-CM | POA: Diagnosis not present

## 2023-04-07 DIAGNOSIS — Z0000 Encounter for general adult medical examination without abnormal findings: Secondary | ICD-10-CM | POA: Diagnosis not present

## 2023-04-07 DIAGNOSIS — D259 Leiomyoma of uterus, unspecified: Secondary | ICD-10-CM | POA: Diagnosis not present

## 2023-04-07 DIAGNOSIS — M8580 Other specified disorders of bone density and structure, unspecified site: Secondary | ICD-10-CM | POA: Diagnosis not present

## 2023-04-07 DIAGNOSIS — I10 Essential (primary) hypertension: Secondary | ICD-10-CM | POA: Diagnosis not present

## 2023-04-07 DIAGNOSIS — Z720 Tobacco use: Secondary | ICD-10-CM | POA: Diagnosis not present

## 2023-04-07 DIAGNOSIS — I700 Atherosclerosis of aorta: Secondary | ICD-10-CM | POA: Diagnosis not present

## 2023-04-07 DIAGNOSIS — N182 Chronic kidney disease, stage 2 (mild): Secondary | ICD-10-CM | POA: Diagnosis not present

## 2023-04-07 DIAGNOSIS — D6869 Other thrombophilia: Secondary | ICD-10-CM | POA: Diagnosis not present

## 2023-04-07 DIAGNOSIS — R319 Hematuria, unspecified: Secondary | ICD-10-CM | POA: Diagnosis not present

## 2023-04-07 DIAGNOSIS — Z131 Encounter for screening for diabetes mellitus: Secondary | ICD-10-CM | POA: Diagnosis not present

## 2023-04-07 DIAGNOSIS — N8110 Cystocele, unspecified: Secondary | ICD-10-CM | POA: Diagnosis not present

## 2023-04-07 DIAGNOSIS — Z960 Presence of urogenital implants: Secondary | ICD-10-CM | POA: Diagnosis not present

## 2023-04-16 DIAGNOSIS — N814 Uterovaginal prolapse, unspecified: Secondary | ICD-10-CM | POA: Diagnosis not present

## 2023-04-16 DIAGNOSIS — Z4689 Encounter for fitting and adjustment of other specified devices: Secondary | ICD-10-CM | POA: Diagnosis not present

## 2023-04-21 DIAGNOSIS — M7122 Synovial cyst of popliteal space [Baker], left knee: Secondary | ICD-10-CM | POA: Diagnosis not present

## 2023-04-21 DIAGNOSIS — M1712 Unilateral primary osteoarthritis, left knee: Secondary | ICD-10-CM | POA: Diagnosis not present

## 2023-05-02 ENCOUNTER — Emergency Department (HOSPITAL_BASED_OUTPATIENT_CLINIC_OR_DEPARTMENT_OTHER): Payer: PPO | Admitting: Radiology

## 2023-05-02 ENCOUNTER — Emergency Department (HOSPITAL_BASED_OUTPATIENT_CLINIC_OR_DEPARTMENT_OTHER)
Admission: EM | Admit: 2023-05-02 | Discharge: 2023-05-02 | Disposition: A | Discharge status: Home / Self Care | Payer: PPO | Attending: Emergency Medicine | Admitting: Emergency Medicine

## 2023-05-02 ENCOUNTER — Encounter (HOSPITAL_BASED_OUTPATIENT_CLINIC_OR_DEPARTMENT_OTHER): Payer: Self-pay | Admitting: Emergency Medicine

## 2023-05-02 ENCOUNTER — Other Ambulatory Visit: Payer: Self-pay

## 2023-05-02 DIAGNOSIS — S29001A Unspecified injury of muscle and tendon of front wall of thorax, initial encounter: Secondary | ICD-10-CM | POA: Diagnosis present

## 2023-05-02 DIAGNOSIS — W01198A Fall on same level from slipping, tripping and stumbling with subsequent striking against other object, initial encounter: Secondary | ICD-10-CM | POA: Insufficient documentation

## 2023-05-02 DIAGNOSIS — S2232XA Fracture of one rib, left side, initial encounter for closed fracture: Secondary | ICD-10-CM | POA: Insufficient documentation

## 2023-05-02 MED ORDER — OXYCODONE HCL 5 MG PO TABS: 5.0000 mg | ORAL_TABLET | Freq: Four times a day (QID) | ORAL | 0 refills | Status: DC | PRN

## 2023-05-02 MED ORDER — LIDOCAINE 5 % EX PTCH: 1.0000 | MEDICATED_PATCH | CUTANEOUS | 0 refills | Status: DC

## 2023-05-02 MED ORDER — OXYCODONE HCL 5 MG PO TABS
5.0000 mg | ORAL_TABLET | Freq: Once | ORAL | Status: AC
  Administered 2023-05-02: 5 mg via ORAL
  Filled 2023-05-02: qty 1

## 2023-05-02 NOTE — ED Provider Notes (Signed)
[place] EMERGENCY DEPARTMENT AT [facility] Provider Note   CSN: [id number] Arrival date & time: 05/02/23  1800     History  Chief Complaint  Patient presents with   Tina Mccullough    Tina Mccullough is a 72 y.o. female.  Patient here after she tripped and hit the left side of her ribs on dog cage.  She had just been walking dog.  She did not hit her head or lose conscious.  Not on blood thinners.  Happened a couple hours ago.  She is having pain with movement and taking deep breaths.  She having any abdominal pain.  No other pain elsewhere.  The history is provided by the patient.       Home Medications Prior to Admission medications   Medication Sig Start Date End Date Taking? Authorizing Provider  lidocaine (LIDODERM) 5 % Place 1 patch onto the skin daily. Remove & Discard patch within 12 hours or as directed by MD 05/02/23  Yes Ottie Tillery, DO  oxyCODONE (ROXICODONE) 5 MG immediate release tablet Take 1 tablet (5 mg total) by mouth every 6 (six) hours as needed for up to 15 doses for breakthrough pain. 05/02/23  Yes Jiyah Torpey, DO  acetaminophen (TYLENOL) 325 MG tablet Take 650 mg by mouth every 6 (six) hours as needed.    [provider]  betamethasone dipropionate 0.05 % lotion APPLY TOPICALLY TO THE AFFECTED AREA TWICE DAILY EVERY DAY 12/19/20   Janalyn Harder, MD  cephALEXin (KEFLEX) 500 MG capsule Take 1 capsule (500 mg total) by mouth 4 (four) times daily. Patient not taking: Reported on 06/11/2020 11/28/14   Felton Clinton, DDS  clobetasol (TEMOVATE) 0.05 % external solution Apply 1 application topically 2 (two) times daily. 06/11/20   Janalyn Harder, MD  diphenhydrAMINE (BENADRYL) 25 mg capsule Take 25 mg by mouth every 6 (six) hours as needed. Allergies    [provider]  EPINEPHrine (EPIPEN) 0.3 mg/0.3 mL DEVI Inject 0.3 mLs (0.3 mg total) into the muscle as needed. 07/05/12   Paz, Earlie Raveling, PA-C  hydrochlorothiazide (HYDRODIURIL) 25 MG  tablet Take 25 mg by mouth daily. Patient not taking: Reported on 06/11/2020    [provider]  HYDROcodone-acetaminophen (NORCO) 5-325 MG per tablet Take 1 tablet by mouth every 6 (six) hours as needed for moderate pain. Patient not taking: Reported on 06/11/2020 11/28/14   Felton Clinton, DDS  rivaroxaban (XARELTO) 20 MG TABS tablet Take 20 mg by mouth daily with supper. Patient not taking: Reported on 06/11/2020    [provider]  sertraline (ZOLOFT) 25 MG tablet Take 25 mg by mouth daily. Patient not taking: Reported on 06/11/2020    [provider]      Allergies    Patient has no known allergies.    Review of Systems   Review of Systems  Physical Exam Updated Vital Signs BP (!) 167/93   Pulse 87   Temp 98.2 F (36.8 C)   Resp 20   Wt 61.2 kg   SpO2 99%   BMI 21.79 kg/m  Physical Exam Vitals and nursing note reviewed.  Constitutional:      General: She is not in acute distress.    Appearance: She is well-developed.  HENT:     Head: Normocephalic and atraumatic.  Eyes:     Extraocular Movements: Extraocular movements intact.     Conjunctiva/sclera: Conjunctivae normal.     Pupils: Pupils are equal, round, and reactive to light.  Cardiovascular:  Rate and Rhythm: Normal rate and regular rhythm.     Pulses: Normal pulses.     Heart sounds: Normal heart sounds. No murmur heard. Pulmonary:     Effort: Pulmonary effort is normal. No respiratory distress.     Breath sounds: Normal breath sounds.  Abdominal:     Palpations: Abdomen is soft.     Tenderness: There is no abdominal tenderness.  Musculoskeletal:        General: Tenderness present. No swelling.     Cervical back: Normal range of motion and neck supple. No tenderness.     Comments: Tenderness to the left side of the ribs mostly in the lower anterior rib area  Skin:    General: Skin is warm and dry.     Capillary Refill: Capillary refill takes less than 2 seconds.  Neurological:      General: No focal deficit present.     Mental Status: She is alert and oriented to person, place, and time.     Cranial Nerves: No cranial nerve deficit.     Sensory: No sensory deficit.     Motor: No weakness.     Coordination: Coordination normal.     Comments: Normal strength and sensation throughout,  Psychiatric:        Mood and Affect: Mood normal.     ED Results / Procedures / Treatments   Labs (all labs ordered are listed, but only abnormal results are displayed) Labs Reviewed - No data to display  EKG None  Radiology DG Ribs Unilateral W/Chest Left  Result Date: 05/02/2023 CLINICAL DATA:  Tripped over dog leash. Fall. Left rib pain. Hurts to take a deep breath. EXAM: LEFT RIBS AND CHEST - 3+ VIEW COMPARISON:  09/07/2013 FINDINGS: Stable cardiomediastinal silhouette. No focal consolidation, pleural effusion, or pneumothorax. The area of symptomatic concern as indicated by the patient was denoted with a metallic skin BB by the technologist. Nondisplaced fracture of the left ninth rib. IMPRESSION: Nondisplaced fracture of the left ninth rib. Electronically Signed   By: Minerva Fester M.D.   On: 05/02/2023 19:27    Procedures Procedures    Medications Ordered in ED Medications  oxyCODONE (Oxy IR/ROXICODONE) immediate release tablet 5 mg (5 mg Oral Given 05/02/23 1950)    ED Course/ Medical Decision Making/ A&P                                 Medical Decision Making Amount and/or Complexity of Data Reviewed Radiology: ordered.  Risk Prescription drug management.   Tina Mccullough is here with pain to the left anterior rib.  Larey Seat hit the left side of dog cage.  Did not hit her head or lose conscious.  Not having any pain elsewhere.  X-ray was performed that did show nondisplaced fracture of the left ninth rib.  No pneumothorax.  I reviewed interpreted EXTR as well.  Patient well-appearing otherwise.  Will give her oxycodone.  Recommend Tylenol lidocaine  patches and Roxicodone for breakthrough pain.  Provide in the setting spirometer.  Understands return precautions.  Discharged in good condition.  He is not having any abdominal pain neck pain or headache or other pain elsewhere.  This chart was dictated using voice recognition software.  Despite best efforts to proofread,  errors can occur which can change the documentation meaning.         Final Clinical Impression(s) / ED Diagnoses Final diagnoses:  Closed  fracture of one rib of left side, initial encounter    Rx / DC Orders ED Discharge Orders          Ordered    oxyCODONE (ROXICODONE) 5 MG immediate release tablet  Every 6 hours PRN        05/02/23 2010    lidocaine (LIDODERM) 5 %  Every 24 hours        05/02/23 2010              Virgina Norfolk, DO 05/02/23 2037

## 2023-05-02 NOTE — Discharge Instructions (Addendum)
Recommend 1000 mg of Tylenol every 6 hours as needed for pain.  Lidocaine patches daily.  Oxycodone for breakthrough pain.  This medication is sedating so please be careful with its use.  Do not drive or do dangerous activities while taking this medicine.

## 2023-05-02 NOTE — ED Notes (Signed)
RT educated pt on proper use of IS. Pt able to perform w/good effort. Pt goal set 1500 mLs pt able to achieve 1000 mls. Pt verbalizes understanding of teaching.     05/02/23 2034  Incentive Spirometry Tx  Level of Service Assisted by RCP  Frequency q1hr W/A  Treatment Tolerance Tolerated well  IS Goal (mL) (RN or RT) 1500 mL  IS - Achieved (mL) (RN, NT, or RT) 1000 mL

## 2023-05-02 NOTE — ED Triage Notes (Signed)
Fell 2 hours ago , tripped on her dog leach , left ribs pain , hurts to take a deep breath she said .

## 2023-05-05 ENCOUNTER — Other Ambulatory Visit: Payer: Self-pay

## 2023-05-05 ENCOUNTER — Encounter (HOSPITAL_BASED_OUTPATIENT_CLINIC_OR_DEPARTMENT_OTHER): Payer: Self-pay | Admitting: Emergency Medicine

## 2023-05-05 ENCOUNTER — Emergency Department (HOSPITAL_BASED_OUTPATIENT_CLINIC_OR_DEPARTMENT_OTHER): Payer: PPO

## 2023-05-05 ENCOUNTER — Observation Stay (HOSPITAL_BASED_OUTPATIENT_CLINIC_OR_DEPARTMENT_OTHER)
Admission: EM | Admit: 2023-05-05 | Discharge: 2023-05-06 | Disposition: A | Discharge status: Home / Self Care | Payer: PPO | Attending: Internal Medicine | Admitting: Internal Medicine

## 2023-05-05 ENCOUNTER — Other Ambulatory Visit (HOSPITAL_BASED_OUTPATIENT_CLINIC_OR_DEPARTMENT_OTHER): Payer: Self-pay

## 2023-05-05 DIAGNOSIS — R0789 Other chest pain: Secondary | ICD-10-CM | POA: Diagnosis not present

## 2023-05-05 DIAGNOSIS — Z1152 Encounter for screening for COVID-19: Secondary | ICD-10-CM | POA: Insufficient documentation

## 2023-05-05 DIAGNOSIS — S2242XA Multiple fractures of ribs, left side, initial encounter for closed fracture: Secondary | ICD-10-CM | POA: Diagnosis not present

## 2023-05-05 DIAGNOSIS — W19XXXA Unspecified fall, initial encounter: Secondary | ICD-10-CM | POA: Insufficient documentation

## 2023-05-05 DIAGNOSIS — J42 Unspecified chronic bronchitis: Secondary | ICD-10-CM | POA: Diagnosis not present

## 2023-05-05 DIAGNOSIS — R079 Chest pain, unspecified: Secondary | ICD-10-CM | POA: Diagnosis not present

## 2023-05-05 DIAGNOSIS — I82622 Acute embolism and thrombosis of deep veins of left upper extremity: Principal | ICD-10-CM | POA: Insufficient documentation

## 2023-05-05 DIAGNOSIS — E876 Hypokalemia: Secondary | ICD-10-CM | POA: Diagnosis not present

## 2023-05-05 DIAGNOSIS — I700 Atherosclerosis of aorta: Secondary | ICD-10-CM | POA: Diagnosis not present

## 2023-05-05 DIAGNOSIS — R9431 Abnormal electrocardiogram [ECG] [EKG]: Secondary | ICD-10-CM | POA: Insufficient documentation

## 2023-05-05 DIAGNOSIS — I10 Essential (primary) hypertension: Secondary | ICD-10-CM | POA: Insufficient documentation

## 2023-05-05 DIAGNOSIS — S2249XA Multiple fractures of ribs, unspecified side, initial encounter for closed fracture: Secondary | ICD-10-CM | POA: Diagnosis present

## 2023-05-05 DIAGNOSIS — R0602 Shortness of breath: Secondary | ICD-10-CM | POA: Diagnosis not present

## 2023-05-05 DIAGNOSIS — Z720 Tobacco use: Secondary | ICD-10-CM

## 2023-05-05 DIAGNOSIS — I82612 Acute embolism and thrombosis of superficial veins of left upper extremity: Secondary | ICD-10-CM | POA: Diagnosis not present

## 2023-05-05 DIAGNOSIS — F17290 Nicotine dependence, other tobacco product, uncomplicated: Secondary | ICD-10-CM | POA: Insufficient documentation

## 2023-05-05 DIAGNOSIS — I82623 Acute embolism and thrombosis of deep veins of upper extremity, bilateral: Secondary | ICD-10-CM | POA: Diagnosis present

## 2023-05-05 DIAGNOSIS — M79622 Pain in left upper arm: Secondary | ICD-10-CM | POA: Diagnosis not present

## 2023-05-05 DIAGNOSIS — I82402 Acute embolism and thrombosis of unspecified deep veins of left lower extremity: Secondary | ICD-10-CM | POA: Diagnosis not present

## 2023-05-05 DIAGNOSIS — K5900 Constipation, unspecified: Secondary | ICD-10-CM | POA: Diagnosis not present

## 2023-05-05 DIAGNOSIS — R7309 Other abnormal glucose: Secondary | ICD-10-CM | POA: Insufficient documentation

## 2023-05-05 DIAGNOSIS — S2242XD Multiple fractures of ribs, left side, subsequent encounter for fracture with routine healing: Secondary | ICD-10-CM | POA: Diagnosis not present

## 2023-05-05 DIAGNOSIS — K7689 Other specified diseases of liver: Secondary | ICD-10-CM | POA: Diagnosis not present

## 2023-05-05 DIAGNOSIS — I82409 Acute embolism and thrombosis of unspecified deep veins of unspecified lower extremity: Secondary | ICD-10-CM | POA: Diagnosis present

## 2023-05-05 DIAGNOSIS — J449 Chronic obstructive pulmonary disease, unspecified: Secondary | ICD-10-CM | POA: Diagnosis not present

## 2023-05-05 DIAGNOSIS — F17210 Nicotine dependence, cigarettes, uncomplicated: Secondary | ICD-10-CM | POA: Diagnosis present

## 2023-05-05 DIAGNOSIS — I82A12 Acute embolism and thrombosis of left axillary vein: Principal | ICD-10-CM

## 2023-05-05 DIAGNOSIS — I82B12 Acute embolism and thrombosis of left subclavian vein: Secondary | ICD-10-CM | POA: Diagnosis not present

## 2023-05-05 HISTORY — DX: Other pulmonary embolism without acute cor pulmonale: I26.99

## 2023-05-05 LAB — CBC WITH DIFFERENTIAL/PLATELET
Abs Immature Granulocytes: 0.06 10*3/uL (ref 0.00–0.07)
Basophils Absolute: 0.1 10*3/uL (ref 0.0–0.1)
Basophils Relative: 0 %
Eosinophils Absolute: 0 10*3/uL (ref 0.0–0.5)
Eosinophils Relative: 0 %
HCT: 36.7 % (ref 36.0–46.0)
Hemoglobin: 12 g/dL (ref 12.0–15.0)
Immature Granulocytes: 0 %
Lymphocytes Relative: 9 %
Lymphs Abs: 1.3 10*3/uL (ref 0.7–4.0)
MCH: 26.9 pg (ref 26.0–34.0)
MCHC: 32.7 g/dL (ref 30.0–36.0)
MCV: 82.3 fL (ref 80.0–100.0)
Monocytes Absolute: 1.2 10*3/uL — ABNORMAL HIGH (ref 0.1–1.0)
Monocytes Relative: 8 %
Neutro Abs: 12.1 10*3/uL — ABNORMAL HIGH (ref 1.7–7.7)
Neutrophils Relative %: 83 %
Platelets: 447 10*3/uL — ABNORMAL HIGH (ref 150–400)
RBC: 4.46 MIL/uL (ref 3.87–5.11)
RDW: 15 % (ref 11.5–15.5)
WBC: 14.7 10*3/uL — ABNORMAL HIGH (ref 4.0–10.5)
nRBC: 0 % (ref 0.0–0.2)

## 2023-05-05 LAB — RESP PANEL BY RT-PCR (RSV, FLU A&B, COVID)  RVPGX2
Influenza A by PCR: NEGATIVE
Influenza B by PCR: NEGATIVE
Resp Syncytial Virus by PCR: NEGATIVE
SARS Coronavirus 2 by RT PCR: NEGATIVE

## 2023-05-05 LAB — BASIC METABOLIC PANEL
Anion gap: 8 (ref 5–15)
BUN: 19 mg/dL (ref 8–23)
CO2: 27 mmol/L (ref 22–32)
Calcium: 9.6 mg/dL (ref 8.9–10.3)
Chloride: 101 mmol/L (ref 98–111)
Creatinine, Ser: 0.78 mg/dL (ref 0.44–1.00)
GFR, Estimated: >60 mL/min (ref >60)
Glucose, Bld: 135 mg/dL — ABNORMAL HIGH (ref 70–99)
Potassium: 3.9 mmol/L (ref 3.5–5.1)
Sodium: 136 mmol/L (ref 135–145)

## 2023-05-05 LAB — TROPONIN I (HIGH SENSITIVITY)
Troponin I (High Sensitivity): 3 ng/L (ref <18)
Troponin I (High Sensitivity): 4 ng/L (ref <18)

## 2023-05-05 LAB — BRAIN NATRIURETIC PEPTIDE: B Natriuretic Peptide: 59 pg/mL (ref 0.0–100.0)

## 2023-05-05 MED ORDER — ACETAMINOPHEN 650 MG RE SUPP: 650.0000 mg | Freq: Four times a day (QID) | RECTAL | Status: DC | PRN

## 2023-05-05 MED ORDER — NICOTINE 21 MG/24HR TD PT24
21.0000 mg | MEDICATED_PATCH | Freq: Once | TRANSDERMAL | Status: AC
  Administered 2023-05-05: 21 mg via TRANSDERMAL
  Filled 2023-05-05: qty 1

## 2023-05-05 MED ORDER — FENTANYL CITRATE PF 50 MCG/ML IJ SOSY: 12.5000 ug | PREFILLED_SYRINGE | INTRAMUSCULAR | Status: DC | PRN

## 2023-05-05 MED ORDER — GUAIFENESIN ER 600 MG PO TB12
600.0000 mg | ORAL_TABLET | Freq: Two times a day (BID) | ORAL | Status: DC
  Administered 2023-05-05 – 2023-05-06 (×2): 600 mg via ORAL
  Filled 2023-05-05 (×2): qty 1

## 2023-05-05 MED ORDER — HYDROCODONE-ACETAMINOPHEN 5-325 MG PO TABS
1.0000 | ORAL_TABLET | ORAL | Status: DC | PRN
  Administered 2023-05-05 – 2023-05-06 (×2): 2 via ORAL
  Filled 2023-05-05 (×2): qty 2

## 2023-05-05 MED ORDER — ALBUTEROL SULFATE (2.5 MG/3ML) 0.083% IN NEBU: 2.5000 mg | INHALATION_SOLUTION | RESPIRATORY_TRACT | Status: DC | PRN

## 2023-05-05 MED ORDER — IOHEXOL 350 MG/ML SOLN
75.0000 mL | Freq: Once | INTRAVENOUS | Status: AC | PRN
  Administered 2023-05-05: 75 mL via INTRAVENOUS

## 2023-05-05 MED ORDER — ACETAMINOPHEN 325 MG PO TABS: 650.0000 mg | ORAL_TABLET | Freq: Four times a day (QID) | ORAL | Status: DC | PRN

## 2023-05-05 MED ORDER — HEPARIN BOLUS VIA INFUSION
4000.0000 [IU] | Freq: Once | INTRAVENOUS | Status: AC
  Administered 2023-05-05: 4000 [IU] via INTRAVENOUS

## 2023-05-05 MED ORDER — SODIUM CHLORIDE 0.9 % IV SOLN
INTRAVENOUS | Status: AC
Start: 2023-05-05 — End: 2023-05-06

## 2023-05-05 MED ORDER — ONDANSETRON HCL 4 MG/2ML IJ SOLN: 4.0000 mg | Freq: Four times a day (QID) | INTRAMUSCULAR | Status: DC | PRN

## 2023-05-05 MED ORDER — ONDANSETRON HCL 4 MG PO TABS: 4.0000 mg | ORAL_TABLET | Freq: Four times a day (QID) | ORAL | Status: DC | PRN

## 2023-05-05 MED ORDER — RIVAROXABAN (XARELTO) VTE STARTER PACK (15 & 20 MG)
ORAL_TABLET | ORAL | 0 refills | Status: DC
  Filled 2023-05-05: qty 51, 30d supply, fill #0

## 2023-05-05 MED ORDER — MORPHINE SULFATE (PF) 4 MG/ML IV SOLN
4.0000 mg | Freq: Once | INTRAVENOUS | Status: AC
  Administered 2023-05-05: 4 mg via INTRAVENOUS
  Filled 2023-05-05: qty 1

## 2023-05-05 MED ORDER — HEPARIN (PORCINE) 25000 UT/250ML-% IV SOLN
1250.0000 [IU]/h | INTRAVENOUS | Status: AC
  Administered 2023-05-05: 1050 [IU]/h via INTRAVENOUS
  Filled 2023-05-05 (×3): qty 250

## 2023-05-05 NOTE — H&P (Signed)
Tina Mccullough BMW:[id number] DOB: 1950/11/16 DOA: 05/05/2023     PCP: [name], MD   Outpatient Specialists: [facility] Dr[facility]Davis  Patient arrived to ER on 05/05/23 at 1120 Referred by Attending Joycelyn Das, MD   Patient coming from:    home Lives alone but now sons are stay at the house    Chief Complaint:   Chief Complaint  Patient presents with   Shortness of Breath    HPI: Tina Mccullough is a 72 y.o. female with medical history significant of recent fall with left-sided rib fractures, history of DVT/ PE, HTN, lymphocytic colitis    Presented with   shortness of breath Pt had a  fall on 11/24 resulting in rib fractures she tripped and hit the left side of her ribs on dog cage. Was seenin ER and prescribed oxycodone since then has been more short of breath and having CP she was worried that she has a PE and went back to ER She had a prior hx of DVT/PE no longer on [facility] Reports nonproductive cough continues to have left-sided rib pain no fevers or chills no leg swelling     Noted swelling in the left arm pit she thought it could have been a  blood clot   Reports no BM for 3 days but passing gas  Denies significant ETOH intake   Does   smoke a pack every 2-3 days but interested in quitting   Lab Results  Component Value Date   SARSCOV2NAA NEGATIVE 05/05/2023    Regarding pertinent Chronic problems:       HTN on olmesartan, medoxomil     COPD - not  followed by pulmonology   not  on baseline oxygen        Hx of DVT/PE 6-7 years ago after surgery no longer on  anticoagulation    used to be on xarelto  While in ER: Clinical Course as of 05/05/23 2015  Wed May 05, 2023  1201 WBC(!): 14.7 [JL]  1201 Pulse Rate(!): 116 [JL]  1617 Unfortunately left upper extremity ultrasound reveals occlusive thrombus within the subclavian and axillary veins.  Informed patient and her son at bedside of these findings.  With positive occlusive upper extremity DVT  plan for admission which patient is amenable with.  Will initiate heparin therapy and admit to medicine [MP]  1644 Discussed with admitting hospitalist who accept patient for admission [MP]    Clinical Course User Index [JL] Ernie Avena, MD [MP] Royanne Foots, DO       Lab Orders         Resp panel by RT-PCR (RSV, Flu A&B, Covid) Anterior Nasal Swab         CBC with Differential         Basic metabolic panel         Brain natriuretic peptide         Heparin level (unfractionated)      [place] left arm Occlusive DVT in the left subclavian and axillary veins. 2. Nonocclusive superficial thrombus in the left basilic vein.  CTA chest -  no PE,   no evidence of infiltrate  Findings described above favor proximal left upper extremity venous system thrombophlebitis.  Following Medications were ordered in ER: Medications  nicotine (NICODERM CQ - dosed in mg/24 hours) patch 21 mg (21 mg Transdermal Patch Applied 05/05/23 1657)  heparin ADULT infusion 100 units/mL (25000 units/273mL) (1,050 Units/hr Intravenous New Bag/Given 05/05/23 1654)  iohexol (OMNIPAQUE) 350  MG/ML injection 75 mL (75 mLs Intravenous Contrast Given 05/05/23 1223)  morphine (PF) 4 MG/ML injection 4 mg (4 mg Intravenous Given 05/05/23 1631)  heparin bolus via infusion 4,000 Units (4,000 Units Intravenous Bolus from Bag 05/05/23 1657)    _________  ED Triage Vitals  Encounter Vitals Group     BP 05/05/23 1128 (!) 164/102     Systolic BP Percentile --      Diastolic BP Percentile --      Pulse Rate 05/05/23 1128 (!) 116     Resp 05/05/23 1128 20     Temp 05/05/23 1128 98 F (36.7 C)     Temp Source 05/05/23 1128 Oral     SpO2 05/05/23 1128 98 %     Weight 05/05/23 1600 135 lb (61.2 kg)     Height 05/05/23 1600 5' 5.5" (1.664 m)     Head Circumference --      Peak Flow --      Pain Score 05/05/23 1733 6     Pain Loc --      Pain Education --      Exclude from Growth Chart --   WJXB(14)@      _________________________________________ Significant initial  Findings: Abnormal Labs Reviewed  CBC WITH DIFFERENTIAL/PLATELET - Abnormal; Notable for the following components:      Result Value   WBC 14.7 (*)    Platelets 447 (*)    Neutro Abs 12.1 (*)    Monocytes Absolute 1.2 (*)    All other components within normal limits  BASIC METABOLIC PANEL - Abnormal; Notable for the following components:   Glucose, Bld 135 (*)    All other components within normal limits    _________________________ Troponin  ordered Cardiac Panel (last 3 results) Recent Labs    05/05/23 1140 05/05/23 1351  TROPONINIHS 3 4    ECG: Ordered Personally reviewed and interpreted by me showing: HR : 112 Rhythm: Sinus tachycardia Left anterior fascicular block Abnormal R-wave progression, late transition ST elevation, consider inferior injury QTC 454    BNP (last 3 results) Recent Labs    05/05/23 1140  BNP 59.0      The recent clinical data is shown below. Vitals:   05/05/23 1630 05/05/23 1700 05/05/23 1730 05/05/23 1843  BP: (!) 155/92 (!) 148/88 (!) 143/91 (!) 149/98  Pulse: (!) 101 96 99 99  Resp: 18 (!) 25 18 16   Temp:    99.4 F (37.4 C)  TempSrc:    Oral  SpO2: 93% 94% 94% 95%  Weight:      Height:        WBC     Component Value Date/Time   WBC 14.7 (H) 05/05/2023 1140   LYMPHSABS 1.3 05/05/2023 1140   MONOABS 1.2 (H) 05/05/2023 1140   EOSABS 0.0 05/05/2023 1140   BASOSABS 0.1 05/05/2023 1140      UA   ordered    Results for orders placed or performed during the hospital encounter of 05/05/23  Resp panel by RT-PCR (RSV, Flu A&B, Covid) Anterior Nasal Swab     Status: None   Collection Time: 05/05/23 11:40 AM   Specimen: Anterior Nasal Swab  Result Value Ref Range Status   SARS Coronavirus 2 by RT PCR NEGATIVE NEGATIVE Final         Influenza A by PCR NEGATIVE NEGATIVE Final   Influenza B by PCR NEGATIVE NEGATIVE Final         Resp Syncytial Virus by PCR  NEGATIVE  NEGATIVE Final         ____________________________________________ Recent Labs  Lab 05/05/23 1140  NA 136  K 3.9  CO2 27  GLUCOSE 135*  BUN 19  CREATININE 0.78  CALCIUM 9.6    Cr  stable,  Lab Results  Component Value Date   CREATININE 0.78 05/05/2023   CREATININE 0.89 06/11/2018   CREATININE 0.92 11/26/2014    Plt: Lab Results  Component Value Date   PLT 447 (H) 05/05/2023    Recent Labs  Lab 05/05/23 1140  WBC 14.7*  NEUTROABS 12.1*  HGB 12.0  HCT 36.7  MCV 82.3  PLT 447*    HG/HCT  stable     Component Value Date/Time   HGB 12.0 05/05/2023 1140   HCT 36.7 05/05/2023 1140   MCV 82.3 05/05/2023 1140    _______________________________________________ Hospitalist was called for admission for   Acute deep vein thrombosis (DVT) of axillary vein of left upper extremity    The following Work up has been ordered so far:  Orders Placed This Encounter  Procedures   Resp panel by RT-PCR (RSV, Flu A&B, Covid) Anterior Nasal Swab   CT Angio Chest PE W and/or Wo Contrast   US Venous Img Upper Uni Left   CBC with Differential   Basic metabolic panel   Brain natriuretic peptide   Heparin level (unfractionated)   Cardiac Monitoring Continuous x 48 hours Indications for use: Other; Other indications for use: DVT   heparin per pharmacy consult   Consult to hospitalist   ED EKG   Saline lock IV   Place in observation (patient's expected length of stay will be less than 2 midnights)     OTHER Significant initial  Findings:  labs showing:     DM  labs:  HbA1C: No results for input(s): "HGBA1C" in the last 8760 hours.    Cultures: No results found for: "SDES", "SPECREQUEST", "CULT", "REPTSTATUS"   Radiological Exams on Admission: US Venous Img Upper Uni Left  Result Date: 05/05/2023 CLINICAL DATA:  Left axillary pain. Left upper extremity DVT seen on CT chest. EXAM: LEFT UPPER EXTREMITY VENOUS DOPPLER ULTRASOUND TECHNIQUE: Gray-scale sonography with  graded compression, as well as color Doppler and duplex ultrasound were performed to evaluate the upper extremity deep venous system from the level of the subclavian vein and including the jugular, axillary, basilic, radial, ulnar and upper cephalic vein. Spectral Doppler was utilized to evaluate flow at rest and with distal augmentation maneuvers. COMPARISON:  CT chest from same day. FINDINGS: Contralateral Subclavian Vein: Respiratory phasicity is normal and symmetric with the symptomatic side. No evidence of thrombus. Normal compressibility. Internal Jugular Vein: No evidence of thrombus. Normal compressibility, respiratory phasicity and response to augmentation. Subclavian Vein: Occlusive and nonocclusive thrombus present. Axillary Vein: Occlusive thrombus present. Cephalic Vein: No evidence of thrombus. Normal compressibility, respiratory phasicity and response to augmentation. Basilic Vein: Nonocclusive thrombus present. Brachial Veins: No evidence of thrombus. Normal compressibility, respiratory phasicity and response to augmentation. Radial Veins: No evidence of thrombus. Normal compressibility, respiratory phasicity and response to augmentation. Ulnar Veins: No evidence of thrombus. Normal compressibility, respiratory phasicity and response to augmentation. Venous Reflux:  None visualized. Other Findings:  None visualized. IMPRESSION: 1. Occlusive DVT in the left subclavian and axillary veins. 2. Nonocclusive superficial thrombus in the left basilic vein. Electronically Signed   By: Obie Dredge M.D.   On: 05/05/2023 15:33   CT Angio Chest PE W and/or Wo Contrast  Result Date: 05/05/2023  CLINICAL DATA:  Pulmonary embolism (PE) suspected, high prob. Shortness of breath. EXAM: CT ANGIOGRAPHY CHEST WITH CONTRAST TECHNIQUE: Multidetector CT imaging of the chest was performed using the standard protocol during bolus administration of intravenous contrast. Multiplanar CT image reconstructions and MIPs  were obtained to evaluate the vascular anatomy. RADIATION DOSE REDUCTION: This exam was performed according to the departmental dose-optimization program which includes automated exposure control, adjustment of the mA and/or kV according to patient size and/or use of iterative reconstruction technique. CONTRAST:  75mL OMNIPAQUE IOHEXOL 350 MG/ML SOLN COMPARISON:  CT scan chest from 03/16/2019. FINDINGS: Cardiovascular: No evidence of embolism to the proximal subsegmental pulmonary artery level. Normal cardiac size. No pericardial effusion. No aortic aneurysm. There are mild peripheral atherosclerotic vascular calcifications of thoracic aorta and its major branches. 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 Further evaluation/confirmation with ultrasound is recommended. Mediastinum/Nodes: Visualized thyroid gland appears grossly unremarkable. No solid / cystic mediastinal masses. The esophagus is nondistended precluding optimal assessment. No axillary, mediastinal or hilar lymphadenopathy by size criteria. Lungs/Pleura: The central tracheo-bronchial tree is patent. There are dependent changes in bilateral lungs. There are also patchy areas of linear, plate-like atelectasis and/or scarring throughout bilateral lungs with predominant lower lobe involvement. There is trace left pleural effusion. No right pleural effusion. No mass or consolidation. No pneumothorax. No suspicious lung nodules. Upper Abdomen: There is stable diffuse thickening of bilateral adrenal glands, without discrete nodule. Findings are nonspecific but mostly associated with adrenal hyperplasia. There is a subcapsular, 1 cm sized hypoattenuating focus in the right hepatic dome, posteriorly, which is too small to adequately characterize but  appears unchanged since the prior study and favored to represent a cyst. Remaining visualized upper abdominal viscera within normal limits. Musculoskeletal: The visualized soft tissues of the chest wall are grossly unremarkable. No suspicious osseous lesions. There are mild multilevel degenerative changes in the visualized spine. Review of the MIP images confirms the above findings. IMPRESSION: 1. No embolism to the proximal subsegmental pulmonary artery level. 2. Findings described above favor proximal left upper extremity venous system thrombophlebitis. Further evaluation/confirmation with ultrasound Doppler examination is recommended. 3. Multiple other nonacute observations, as described above. Aortic Atherosclerosis (ICD10-I70.0). Electronically Signed   By: Jules Schick M.D.   On: 05/05/2023 14:19   _______________________________________________________________________________________________________ Latest  Blood pressure (!) 149/98, pulse 99, temperature 99.4 F (37.4 C), temperature source Oral, resp. rate 16, height 5' 5.5" (1.664 m), weight 61.2 kg, SpO2 95%.   Vitals  labs and radiology finding personally reviewed  Review of Systems:    Pertinent positives include:   shortness of breath at rest.   chest pain Constitutional:  No weight loss, night sweats, Fevers, chills, fatigue, weight loss  HEENT:  No headaches, Difficulty swallowing,Tooth/dental problems,Sore throat,  No sneezing, itching, ear ache, nasal congestion, post nasal drip,  Cardio-vascular:  No, Orthopnea, PND, anasarca, dizziness, palpitations.no Bilateral lower extremity swelling  GI:  No heartburn, indigestion, abdominal pain, nausea, vomiting, diarrhea, change in bowel habits, loss of appetite, melena, blood in stool, hematemesis Resp:  noNo dyspnea on exertion, No excess mucus, no productive cough, No non-productive cough, No coughing up of blood.No change in color of mucus.No wheezing. Skin:  no rash or lesions.  No jaundice GU:  no dysuria, change in color of urine, no urgency or frequency. No straining to urinate.  No flank  pain.  Musculoskeletal:  No joint pain or no joint swelling. No decreased range of motion. No back pain.  Psych:  No change in mood or affect. No depression or anxiety. No memory loss.  Neuro: no localizing neurological complaints, no tingling, no weakness, no double vision, no gait abnormality, no slurred speech, no confusion  All systems reviewed and apart from HOPI all are negative _______________________________________________________________________________________________ Past Medical History:   Past Medical History:  Diagnosis Date   Anxiety    Depression    DVT of axillary vein, acute left (HCC)    left subclavian vein   Hypertension    PONV (postoperative nausea and vomiting)    Pulmonary embolus (HCC)     Past Surgical History:  Procedure Laterality Date   ARTHROSCOPIC REPAIR ACL Left    CESAREAN SECTION     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 She has a 37 pack-year smoking history. She has never used smokeless tobacco. She reports that she does not drink alcohol and does not use drugs.   Family History:   History reviewed. No pertinent family history. ______________________________________________________________________________________________ Allergies: Allergies  Allergen Reactions   Beef-Derived Products Hives   Pork-Derived Products Hives    Prior to Admission medications   Medication Sig Start Date End Date Taking? Authorizing Provider  RIVAROXABAN Carlena Hurl) VTE STARTER PACK (15 & 20 MG)  Follow package directions: Take one 15mg  tablet by mouth twice a day. On day 22, switch to one 20mg  tablet once a day. Take with food. 05/05/23  Yes Ernie Avena, MD  acetaminophen (TYLENOL) 325 MG tablet Take 650 mg by mouth every 6 (six) hours as needed.    [provider]  betamethasone dipropionate 0.05 % lotion APPLY TOPICALLY TO THE AFFECTED AREA TWICE DAILY EVERY DAY 12/19/20   Janalyn Harder, MD  cephALEXin (KEFLEX) 500 MG capsule Take 1 capsule (500 mg total) by mouth 4 (four) times daily. Patient not taking: Reported on 06/11/2020 11/28/14   Felton Clinton, DDS  clobetasol (TEMOVATE) 0.05 % external solution Apply 1 application topically 2 (two) times daily. 06/11/20   Janalyn Harder, MD  diphenhydrAMINE (BENADRYL) 25 mg capsule Take 25 mg by mouth every 6 (six) hours as needed. Allergies    [provider]  EPINEPHrine (EPIPEN) 0.3 mg/0.3 mL DEVI Inject 0.3 mLs (0.3 mg total) into the muscle as needed. 07/05/12   Paz, Earlie Raveling, PA-C  hydrochlorothiazide (HYDRODIURIL) 25 MG tablet Take 25 mg by mouth daily. Patient not taking: Reported on 06/11/2020    [provider]  HYDROcodone-acetaminophen (NORCO) 5-325 MG per tablet Take 1 tablet by mouth every 6 (six) hours as needed for moderate pain. Patient not taking: Reported on 06/11/2020 11/28/14   Felton Clinton, DDS  lidocaine (LIDODERM) 5 % Place 1 patch onto the skin daily. Remove & Discard patch within 12 hours or as directed by MD 05/02/23   Virgina Norfolk, DO  oxyCODONE (ROXICODONE) 5 MG immediate release tablet Take 1 tablet (5 mg total) by mouth every 6 (six) hours as needed for up to 15 doses for breakthrough pain. 05/02/23   Virgina Norfolk, DO  sertraline (ZOLOFT) 25 MG tablet Take 25 mg by mouth daily. Patient not taking: Reported on 06/11/2020    [provider]    ___________________________________________________________________________________________________ Physical Exam:    05/05/2023    6:43 PM  05/05/2023    5:30 PM 05/05/2023    5:00 PM  Vitals with BMI  Systolic 149 143 409  Diastolic 98 91 88  Pulse 99 99 96    1. General:  in No  Acute distress    Chronically ill-appearing 2. Psychological: Alert and  Oriented 3. Head/ENT:   Moist   Mucous Membranes                          Head Non traumatic, neck supple                          Poor Dentition 4. SKIN: decreased Skin turgor,  Skin clean Dry and intact no rash 5. Heart: Regular rate and rhythm no  Murmur, no Rub or gallop 6. Lungs  no wheezes or crackles   7. Abdomen: Soft,  non-tender, Non distended bowel sounds present 8. Lower extremities: no clubbing, cyanosis, no  edema 9. Neurologically Grossly intact, moving all 4 extremities equally   10. MSK: Normal range of motion    Chart has been reviewed  ______________________________________________________________________________________________  Assessment/Plan 72 y.o. female with medical history significant of recent fall with left-sided rib fractures, history of DVT/ PE, HTN, lymphocytic colitis  admitted for   Acute deep vein thrombosis (DVT) of axillary vein of left upper extremity , rib fracture Chest pain,  constipation  Present on Admission:  DVT (deep venous thrombosis) (HCC)  Essential hypertension  Tobacco abuse  COPD (chronic obstructive pulmonary disease) (HCC)  Multiple rib fractures  Abnormal ECG  Constipation     DVT (deep venous thrombosis) (HCC) Currently continue on heparin likely in the setting of recent trauma May need follow-up with hematology as an outpatient given recurrent DVTs of upper extremity  Essential hypertension Chronic stable allow permissive hypertension for tonight  Tobacco abuse  - Spoke about importance of quitting spent 5 minutes discussing options for treatment, prior attempts at quitting, and dangers of smoking  -At this point patient is   interested in quitting  - order nicotine patch   - nursing tobacco  cessation protocol   COPD (chronic obstructive pulmonary disease) (HCC) Chronic not on any oxygen at baseline continue to monitor supportive management encouraged stopping smoking  Multiple rib fractures Continue incentive spirometry and pain management  Abnormal ECG CP related to recent fall reproducible by palpation no dynamic changes on ECG Trop 3, 4 For completion obtain baseline echo in AM  If abnormal may need cardiology follow up   Constipation Bowel regime and order KUB    Other plan as per orders.  DVT prophylaxis:  heparin      Code Status:    Code Status: Not on file FULL CODE  as per patient   I had personally discussed CODE STATUS with patient   ACP   none    Family Communication:   Family not at  Bedside    Diet heart healthy   Disposition Plan:       To home once workup is complete and patient is stable   Following barriers for discharge:  Pain controlled with PO medications                                    Consult Orders  (From admission, onward)           Start     Ordered   05/05/23 1620  Consult to hospitalist  Called [facility] for consult with hospitalist spoke with Manishia    4:28p  Once       Provider:  (Not yet assigned)  Question Answer Comment  Place call to: [facility]   Reason for Consult Admit      05/05/23 1619                               Consults called: none     Admission status:  ED Disposition     ED Disposition  Admit   Condition  --   Comment  Hospital [facility] [[id number]]  Level of Care: Telemetry [5]  Admit to tele based on following criteria: Other see comments  Comments: DVT, fall  Interfacility transfer: Yes  May place patient in [facility] at [facility] or [facility] if equivalent level of care is available:: Yes  Covid Evaluation: Asymptomatic - no recent exposure (last 10 days) testing not required   Diagnosis: DVT (deep venous thrombosis) [facility]) [[id number]]  Admitting Physician: Joycelyn Das [[id number]]  Attending Physician: Joycelyn Das [[id number]]          Obs     Level of care     tele  For 12H      Lab Results  Component Value Date   SARSCOV2NAA NEGATIVE 05/05/2023    Vayda Dungee 05/06/2023, 1:00 AM    [facility]     after 2 AM please page floor coverage PA If 7AM-7PM, please contact the day team taking care of the patient using [email or web address]

## 2023-05-05 NOTE — ED Provider Notes (Signed)
[place] EMERGENCY DEPARTMENT AT [facility] Provider Note   CSN: [id number] Arrival date & time: 05/05/23  1120     History  Chief Complaint  Patient presents with   Shortness of Breath    Tina Mccullough is a 72 y.o. female.   Shortness of Breath Associated symptoms: chest pain and cough      72 year old female with medical history significant for PE, DVT of axillary vein, not on anticoagulation, anxiety, hypertension, recent ER visit for fall on 11/24 with a diagnosis of rib fracture presenting to the emergency department with chest pain, pleuritic discomfort and shortness of breath.  The patient states that she has had a nonproductive cough.  She has left-sided rib pain but feels that the pain she is experiencing now is different.  It feels like her prior PE.  She denies any fevers or chills.  She denies any squeezing or pressure discomfort, denies any ripping or tearing discomfort.  She endorses shortness of breath.  Denies any lower extremity swelling.  Home Medications Prior to Admission medications   Medication Sig Start Date End Date Taking? Authorizing Provider  RIVAROXABAN Carlena Hurl) VTE STARTER PACK (15 & 20 MG) Follow package directions: Take one 15mg  tablet by mouth twice a day. On day 22, switch to one 20mg  tablet once a day. Take with food. 05/05/23  Yes Ernie Avena, MD  acetaminophen (TYLENOL) 325 MG tablet Take 650 mg by mouth every 6 (six) hours as needed.    [provider]  betamethasone dipropionate 0.05 % lotion APPLY TOPICALLY TO THE AFFECTED AREA TWICE DAILY EVERY DAY 12/19/20   Janalyn Harder, MD  cephALEXin (KEFLEX) 500 MG capsule Take 1 capsule (500 mg total) by mouth 4 (four) times daily. Patient not taking: Reported on 06/11/2020 11/28/14   Felton Clinton, DDS  clobetasol (TEMOVATE) 0.05 % external solution Apply 1 application topically 2 (two) times daily. 06/11/20   Janalyn Harder, MD  diphenhydrAMINE (BENADRYL) 25 mg capsule Take  25 mg by mouth every 6 (six) hours as needed. Allergies    [provider]  EPINEPHrine (EPIPEN) 0.3 mg/0.3 mL DEVI Inject 0.3 mLs (0.3 mg total) into the muscle as needed. 07/05/12   Paz, Earlie Raveling, PA-C  hydrochlorothiazide (HYDRODIURIL) 25 MG tablet Take 25 mg by mouth daily. Patient not taking: Reported on 06/11/2020    [provider]  HYDROcodone-acetaminophen (NORCO) 5-325 MG per tablet Take 1 tablet by mouth every 6 (six) hours as needed for moderate pain. Patient not taking: Reported on 06/11/2020 11/28/14   Felton Clinton, DDS  lidocaine (LIDODERM) 5 % Place 1 patch onto the skin daily. Remove & Discard patch within 12 hours or as directed by MD 05/02/23   Virgina Norfolk, DO  oxyCODONE (ROXICODONE) 5 MG immediate release tablet Take 1 tablet (5 mg total) by mouth every 6 (six) hours as needed for up to 15 doses for breakthrough pain. 05/02/23   Curatolo, Adam, DO  sertraline (ZOLOFT) 25 MG tablet Take 25 mg by mouth daily. Patient not taking: Reported on 06/11/2020    [provider]      Allergies    Patient has no known allergies.    Review of Systems   Review of Systems  Respiratory:  Positive for cough and shortness of breath.   Cardiovascular:  Positive for chest pain.  All other systems reviewed and are negative.   Physical Exam Updated Vital Signs BP (!) 156/82   Pulse 97   Temp 98 F (36.7 C) (  Oral)   Resp (!) 21   SpO2 94%  Physical Exam Vitals and nursing note reviewed.  Constitutional:      General: She is not in acute distress.    Appearance: She is well-developed.  HENT:     Head: Normocephalic and atraumatic.  Eyes:     Conjunctiva/sclera: Conjunctivae normal.  Cardiovascular:     Rate and Rhythm: Regular rhythm. Tachycardia present.  Pulmonary:     Effort: Pulmonary effort is normal. No respiratory distress.     Breath sounds: Normal breath sounds.  Abdominal:     Palpations: Abdomen is soft.     Tenderness: There is no abdominal  tenderness.  Musculoskeletal:        General: No swelling.     Cervical back: Neck supple.     Right lower leg: No edema.     Left lower leg: No edema.  Skin:    General: Skin is warm and dry.     Capillary Refill: Capillary refill takes less than 2 seconds.  Neurological:     Mental Status: She is alert.  Psychiatric:        Mood and Affect: Mood normal.     ED Results / Procedures / Treatments   Labs (all labs ordered are listed, but only abnormal results are displayed) Labs Reviewed  CBC WITH DIFFERENTIAL/PLATELET - Abnormal; Notable for the following components:      Result Value   WBC 14.7 (*)    Platelets 447 (*)    Neutro Abs 12.1 (*)    Monocytes Absolute 1.2 (*)    All other components within normal limits  BASIC METABOLIC PANEL - Abnormal; Notable for the following components:   Glucose, Bld 135 (*)    All other components within normal limits  RESP PANEL BY RT-PCR (RSV, FLU A&B, COVID)  [id number]  BRAIN NATRIURETIC PEPTIDE  TROPONIN I (HIGH SENSITIVITY)  TROPONIN I (HIGH SENSITIVITY)    EKG EKG Interpretation Date/Time:  Wednesday May 05 2023 11:31:33 EST Ventricular Rate:  112 PR Interval:  188 QRS Duration:  102 QT Interval:  332 QTC Calculation: 454 R Axis:   -79  Text Interpretation: Sinus tachycardia Left anterior fascicular block Abnormal R-wave progression, late transition ST elevation, consider inferior injury Confirmed by Ernie Avena ([id number]) on 05/05/2023 12:04:21 PM  Radiology CT Angio Chest PE W and/or Wo Contrast  Result Date: 05/05/2023 CLINICAL DATA:  Pulmonary embolism (PE) suspected, high prob. Shortness of breath. EXAM: CT ANGIOGRAPHY CHEST WITH CONTRAST TECHNIQUE: Multidetector CT imaging of the chest was performed using the standard protocol during bolus administration of intravenous contrast. Multiplanar CT image reconstructions and MIPs were obtained to evaluate the vascular anatomy. RADIATION DOSE REDUCTION: This exam was  performed according to the departmental dose-optimization program which includes automated exposure control, adjustment of the mA and/or kV according to patient size and/or use of iterative reconstruction technique. CONTRAST:  75mL OMNIPAQUE IOHEXOL 350 MG/ML SOLN COMPARISON:  CT scan chest from 03/16/2019. FINDINGS: Cardiovascular: No evidence of embolism to the proximal subsegmental pulmonary artery level. Normal cardiac size. No pericardial effusion. No aortic aneurysm. There are mild peripheral atherosclerotic vascular calcifications of thoracic aorta and its major branches. 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 Further evaluation/confirmation with ultrasound is recommended. Mediastinum/Nodes: Visualized thyroid gland appears grossly unremarkable. No solid / cystic mediastinal masses. The esophagus  is nondistended precluding optimal assessment. No axillary, mediastinal or hilar lymphadenopathy by size criteria. Lungs/Pleura: The central tracheo-bronchial tree is patent. There are dependent changes in bilateral lungs. There are also patchy areas of linear, plate-like atelectasis and/or scarring throughout bilateral lungs with predominant lower lobe involvement. There is trace left pleural effusion. No right pleural effusion. No mass or consolidation. No pneumothorax. No suspicious lung nodules. Upper Abdomen: There is stable diffuse thickening of bilateral adrenal glands, without discrete nodule. Findings are nonspecific but mostly associated with adrenal hyperplasia. There is a subcapsular, 1 cm sized hypoattenuating focus in the right hepatic dome, posteriorly, which is too small to adequately characterize but appears unchanged since the prior study and favored to represent a cyst. Remaining  visualized upper abdominal viscera within normal limits. Musculoskeletal: The visualized soft tissues of the chest wall are grossly unremarkable. No suspicious osseous lesions. There are mild multilevel degenerative changes in the visualized spine. Review of the MIP images confirms the above findings. IMPRESSION: 1. No embolism to the proximal subsegmental pulmonary artery level. 2. Findings described above favor proximal left upper extremity venous system thrombophlebitis. Further evaluation/confirmation with ultrasound Doppler examination is recommended. 3. Multiple other nonacute observations, as described above. Aortic Atherosclerosis (ICD10-I70.0). Electronically Signed   By: Jules Schick M.D.   On: 05/05/2023 14:19    Procedures Procedures    Medications Ordered in ED Medications  iohexol (OMNIPAQUE) 350 MG/ML injection 75 mL (75 mLs Intravenous Contrast Given 05/05/23 1223)    ED Course/ Medical Decision Making/ A&P Clinical Course as of 05/05/23 1501  Wed May 05, 2023  1201 WBC(!): 14.7 [JL]  1201 Pulse Rate(!): 116 [JL]    Clinical Course User Index [JL] Ernie Avena, MD                                 Medical Decision Making Amount and/or Complexity of Data Reviewed Labs: ordered. Decision-making details documented in ED Course. Radiology: ordered.  Risk Prescription drug management.    72 year old female with medical history significant for PE, DVT of axillary vein, not on anticoagulation, anxiety, hypertension, recent ER visit for fall on 11/24 with a diagnosis of rib fracture presenting to the emergency department with chest pain, pleuritic discomfort and shortness of breath.  The patient states that she has had a nonproductive cough.  She has left-sided rib pain but feels that the pain she is experiencing now is different.  It feels like her prior PE.  She denies any fevers or chills.  She denies any squeezing or pressure discomfort, denies any ripping or tearing  discomfort.  She endorses shortness of breath.  Denies any lower extremity swelling.   On arrival, the patient was afebrile, tachycardic heart rate 116, not tachypneic RR 20, BP 164/102, saturating 98% on room air.  Sinus tachycardia noted on cardiac telemetry.  Physical exam revealed tachycardia, no lower extremity edema, lungs were clear to auscultation.  Concern for for PE, considered bacterial pneumonia, considered viral infection, pain from rib fx, pleural effusion, less likely ACS.  EKG: Sinus tachycardia, ventricular rate 112, no STEMI.  Labs: Leukocytosis to 14.7, COVID and influenza and RSV PCR testing negative, troponin and BNP normal, BMP unremarkable.  CTA PE:  IMPRESSION:  1. No embolism to the proximal subsegmental pulmonary artery level.  2. Findings described above favor proximal left upper extremity  venous system thrombophlebitis. Further evaluation/confirmation with  ultrasound Doppler examination is recommended.  3.  Multiple other nonacute observations, as described above.    Aortic Atherosclerosis (ICD10-I70.0).    DVT ultrasound pending at time of signout.  The patient was informed of the diagnosis of potential DVT, CT was negative for PE.  Patient will likely need to be started back on anticoagulation.  Her GFR is greater than 60 and her creatinine is normal.  Xarelto starter pack prescribed.  Plan at time of signout to follow-up results of DVT ultrasound imaging, reassess the patient with plan for discharge. Signout given to Dr. Elayne Snare at 1500.   Final Clinical Impression(s) / ED Diagnoses Final diagnoses:  Acute deep vein thrombosis (DVT) of axillary vein of left upper extremity (HCC)  Shortness of breath  Chest pain, unspecified type    Rx / DC Orders ED Discharge Orders          Ordered    RIVAROXABAN (XARELTO) VTE STARTER PACK (15 & 20 MG)        05/05/23 1441              Ernie Avena, MD 05/05/23 1501

## 2023-05-05 NOTE — Subjective & Objective (Signed)
Pt had a  fall on 11/24 resulting in rib fractures she tripped and hit the left side of her ribs on dog cage. Was seenin ER and prescribed oxycodone since then has been more short of breath and having CP she was worried that she has a PE and went back to ER She had a prior hx of DVT/PE no longer on [facility] Reports nonproductive cough continues to have left-sided rib pain no fevers or chills no leg swelling

## 2023-05-05 NOTE — Progress Notes (Signed)
PHARMACY - ANTICOAGULATION CONSULT NOTE  Pharmacy Consult for heparin infusion Indication: DVT  No Known Allergies  Patient Measurements:   Heparin Dosing Weight: 61.2 kg  Vital Signs: Temp: 98.3 F (36.8 C) (11/27 1539) Temp Source: Oral (11/27 1128) BP: 162/107 (11/27 1430) Pulse Rate: 105 (11/27 1430)  Labs: Recent Labs    05/05/23 1140 05/05/23 1351  HGB 12.0  --   HCT 36.7  --   PLT 447*  --   CREATININE 0.78  --   TROPONINIHS 3 4    CrCl cannot be calculated (Unknown ideal weight.).   Medical History: Past Medical History:  Diagnosis Date   Anxiety    Depression    DVT of axillary vein, acute left (HCC)    left subclavian vein   Hypertension    PONV (postoperative nausea and vomiting)    Pulmonary embolus (HCC)    Assessment: 72yoF with chief complaint of chest pain and cough, history of PE/DVT not on anticoagulation. Pt reports feels similar to previous PE. Originally was to initiate Xarelto, however appears pt will be admitted, pharmacy consulted to initiate heparin infusion  Goal of Therapy:  Heparin level 0.3-0.7 units/ml Monitor platelets by anticoagulation protocol: Yes   Plan:  Give 4000 units bolus x 1 Start heparin infusion at 1050 units/hr Check anti-Xa level in 8 hours and daily while on heparin Continue to monitor H&H and platelets  Rutherford Nail, PharmD PGY2 Critical Care Pharmacy Resident 05/05/2023,4:21 PM

## 2023-05-05 NOTE — ED Triage Notes (Signed)
Pt feels short of breath, feels like it has worsened since 11/24 visit. Pain under left arm,has hx PE and wanted to be evaluated.

## 2023-05-05 NOTE — Assessment & Plan Note (Signed)
Chronic not on any oxygen at baseline continue to monitor supportive management encouraged stopping smoking

## 2023-05-05 NOTE — Assessment & Plan Note (Signed)
Chronic stable allow permissive hypertension for tonight ?

## 2023-05-05 NOTE — Assessment & Plan Note (Signed)
-   Spoke about importance of quitting spent 5 minutes discussing options for treatment, prior attempts at quitting, and dangers of smoking ? -At this point patient is    interested in quitting ? - order nicotine patch  ? - nursing tobacco cessation protocol ? ?

## 2023-05-05 NOTE — ED Provider Notes (Signed)
  Physical Exam  BP (!) 162/107   Pulse (!) 105   Temp 98.3 F (36.8 C)   Resp 19   SpO2 95%   Physical Exam Vitals and nursing note reviewed.  HENT:     Head: Normocephalic and atraumatic.  Eyes:     Pupils: Pupils are equal, round, and reactive to light.  Cardiovascular:     Rate and Rhythm: Normal rate and regular rhythm.  Pulmonary:     Effort: Pulmonary effort is normal.     Breath sounds: Normal breath sounds.  Abdominal:     Palpations: Abdomen is soft.     Tenderness: There is no abdominal tenderness.  Skin:    General: Skin is warm and dry.  Neurological:     Mental Status: She is alert.  Psychiatric:        Mood and Affect: Mood normal.     Procedures  Procedures  ED Course / MDM   Clinical Course as of 05/05/23 1644  Wed May 05, 2023  1201 WBC(!): 14.7 [JL]  1201 Pulse Rate(!): 116 [JL]  1617 Unfortunately left upper extremity ultrasound reveals occlusive thrombus within the subclavian and axillary veins.  Informed patient and her son at bedside of these findings.  With positive occlusive upper extremity DVT plan for admission which patient is amenable with.  Will initiate heparin therapy and admit to medicine [MP]  1644 Discussed with admitting hospitalist who accept patient for admission [MP]    Clinical Course User Index [JL] Ernie Avena, MD [MP] Royanne Foots, DO   Medical Decision Making I, Estelle June DO, have assumed care of this patient from the previous provider pending upper extremity ultrasound DVT study results, reevaluation and disposition  Amount and/or Complexity of Data Reviewed Labs: ordered. Decision-making details documented in ED Course. Radiology: ordered.  Risk Prescription drug management.   Final diagnosis Chest pain, Shortness of breath Acute DVT of axillary vein of left upper extremity Acute DVT of subclavian vein       Royanne Foots, DO 05/05/23 1645

## 2023-05-05 NOTE — Assessment & Plan Note (Signed)
Currently continue on heparin likely in the setting of recent trauma May need follow-up with hematology as an outpatient given recurrent DVTs of upper extremity

## 2023-05-05 NOTE — Assessment & Plan Note (Signed)
Continue incentive spirometry and pain management

## 2023-05-06 ENCOUNTER — Observation Stay (HOSPITAL_COMMUNITY): Payer: PPO

## 2023-05-06 DIAGNOSIS — I10 Essential (primary) hypertension: Secondary | ICD-10-CM | POA: Diagnosis not present

## 2023-05-06 DIAGNOSIS — J449 Chronic obstructive pulmonary disease, unspecified: Secondary | ICD-10-CM | POA: Diagnosis not present

## 2023-05-06 DIAGNOSIS — Z1152 Encounter for screening for COVID-19: Secondary | ICD-10-CM | POA: Diagnosis not present

## 2023-05-06 DIAGNOSIS — I82A12 Acute embolism and thrombosis of left axillary vein: Secondary | ICD-10-CM

## 2023-05-06 DIAGNOSIS — I82622 Acute embolism and thrombosis of deep veins of left upper extremity: Secondary | ICD-10-CM | POA: Diagnosis not present

## 2023-05-06 DIAGNOSIS — K5900 Constipation, unspecified: Secondary | ICD-10-CM | POA: Diagnosis present

## 2023-05-06 DIAGNOSIS — S2242XA Multiple fractures of ribs, left side, initial encounter for closed fracture: Secondary | ICD-10-CM | POA: Diagnosis not present

## 2023-05-06 DIAGNOSIS — R9431 Abnormal electrocardiogram [ECG] [EKG]: Secondary | ICD-10-CM | POA: Diagnosis not present

## 2023-05-06 DIAGNOSIS — F17290 Nicotine dependence, other tobacco product, uncomplicated: Secondary | ICD-10-CM | POA: Diagnosis not present

## 2023-05-06 DIAGNOSIS — E876 Hypokalemia: Secondary | ICD-10-CM | POA: Diagnosis not present

## 2023-05-06 LAB — CBC
HCT: 31.3 % — ABNORMAL LOW (ref 36.0–46.0)
Hemoglobin: 10.3 g/dL — ABNORMAL LOW (ref 12.0–15.0)
MCH: 27.5 pg (ref 26.0–34.0)
MCHC: 32.9 g/dL (ref 30.0–36.0)
MCV: 83.5 fL (ref 80.0–100.0)
Platelets: 349 10*3/uL (ref 150–400)
RBC: 3.75 MIL/uL — ABNORMAL LOW (ref 3.87–5.11)
RDW: 15 % (ref 11.5–15.5)
WBC: 10.6 10*3/uL — ABNORMAL HIGH (ref 4.0–10.5)
nRBC: 0 % (ref 0.0–0.2)

## 2023-05-06 LAB — COMPREHENSIVE METABOLIC PANEL
ALT: 10 U/L (ref 0–44)
AST: 10 U/L — ABNORMAL LOW (ref 15–41)
Albumin: 2.8 g/dL — ABNORMAL LOW (ref 3.5–5.0)
Alkaline Phosphatase: 61 U/L (ref 38–126)
Anion gap: 8 (ref 5–15)
BUN: 20 mg/dL (ref 8–23)
CO2: 23 mmol/L (ref 22–32)
Calcium: 8.5 mg/dL — ABNORMAL LOW (ref 8.9–10.3)
Chloride: 102 mmol/L (ref 98–111)
Creatinine, Ser: 0.72 mg/dL (ref 0.44–1.00)
GFR, Estimated: >60 mL/min (ref >60)
Glucose, Bld: 98 mg/dL (ref 70–99)
Potassium: 3 mmol/L — ABNORMAL LOW (ref 3.5–5.1)
Sodium: 133 mmol/L — ABNORMAL LOW (ref 135–145)
Total Bilirubin: 0.5 mg/dL (ref <1.2)
Total Protein: 5.6 g/dL — ABNORMAL LOW (ref 6.5–8.1)

## 2023-05-06 LAB — HIV ANTIBODY (ROUTINE TESTING W REFLEX): HIV Screen 4th Generation wRfx: NONREACTIVE

## 2023-05-06 LAB — BASIC METABOLIC PANEL
Anion gap: 7 (ref 5–15)
BUN: 15 mg/dL (ref 8–23)
CO2: 25 mmol/L (ref 22–32)
Calcium: 9.1 mg/dL (ref 8.9–10.3)
Chloride: 104 mmol/L (ref 98–111)
Creatinine, Ser: 0.62 mg/dL (ref 0.44–1.00)
GFR, Estimated: >60 mL/min (ref >60)
Glucose, Bld: 93 mg/dL (ref 70–99)
Potassium: 4.5 mmol/L (ref 3.5–5.1)
Sodium: 136 mmol/L (ref 135–145)

## 2023-05-06 LAB — PHOSPHORUS: Phosphorus: 3.5 mg/dL (ref 2.5–4.6)

## 2023-05-06 LAB — HEPARIN LEVEL (UNFRACTIONATED): Heparin Unfractionated: 0.14 [IU]/mL — ABNORMAL LOW (ref 0.30–0.70)

## 2023-05-06 LAB — HEMOGLOBIN A1C
Hgb A1c MFr Bld: 6 % — ABNORMAL HIGH (ref 4.8–5.6)
Mean Plasma Glucose: 125.5 mg/dL

## 2023-05-06 LAB — MAGNESIUM: Magnesium: 2 mg/dL (ref 1.7–2.4)

## 2023-05-06 MED ORDER — RIVAROXABAN 15 MG PO TABS
15.0000 mg | ORAL_TABLET | Freq: Two times a day (BID) | ORAL | Status: DC
  Administered 2023-05-06: 15 mg via ORAL
  Filled 2023-05-06: qty 1

## 2023-05-06 MED ORDER — POTASSIUM CHLORIDE CRYS ER 20 MEQ PO TBCR
20.0000 meq | EXTENDED_RELEASE_TABLET | Freq: Once | ORAL | Status: AC
  Administered 2023-05-06: 20 meq via ORAL
  Filled 2023-05-06: qty 1

## 2023-05-06 MED ORDER — HEPARIN BOLUS VIA INFUSION
1800.0000 [IU] | Freq: Once | INTRAVENOUS | Status: AC
Start: 2023-05-06 — End: 2023-05-06
  Administered 2023-05-06: 1800 [IU] via INTRAVENOUS
  Filled 2023-05-06: qty 1800

## 2023-05-06 MED ORDER — HYDROCODONE-ACETAMINOPHEN 5-325 MG PO TABS: 1.0000 | ORAL_TABLET | ORAL | 0 refills | Status: DC | PRN

## 2023-05-06 MED ORDER — APIXABAN 5 MG PO TABS: 5.0000 mg | ORAL_TABLET | Freq: Two times a day (BID) | ORAL | Status: DC

## 2023-05-06 MED ORDER — RIVAROXABAN 2.5 MG PO TABS: ORAL_TABLET | ORAL | 1 refills | Status: DC

## 2023-05-06 MED ORDER — RIVAROXABAN 20 MG PO TABS: 20.0000 mg | ORAL_TABLET | Freq: Every day | ORAL | Status: DC

## 2023-05-06 MED ORDER — DOCUSATE SODIUM 100 MG PO CAPS: 100.0000 mg | ORAL_CAPSULE | Freq: Every day | ORAL | Status: DC | PRN

## 2023-05-06 MED ORDER — BISACODYL 10 MG RE SUPP
10.0000 mg | Freq: Once | RECTAL | Status: AC
  Administered 2023-05-06: 10 mg via RECTAL
  Filled 2023-05-06: qty 1

## 2023-05-06 MED ORDER — APIXABAN 5 MG PO TABS: 10.0000 mg | ORAL_TABLET | Freq: Two times a day (BID) | ORAL | Status: DC

## 2023-05-06 MED ORDER — POTASSIUM CHLORIDE 10 MEQ/100ML IV SOLN
10.0000 meq | INTRAVENOUS | Status: DC
  Administered 2023-05-06 (×2): 10 meq via INTRAVENOUS
  Filled 2023-05-06 (×3): qty 100

## 2023-05-06 MED ORDER — POTASSIUM CHLORIDE 10 MEQ/100ML IV SOLN
10.0000 meq | INTRAVENOUS | Status: AC
  Administered 2023-05-06 (×2): 10 meq via INTRAVENOUS
  Filled 2023-05-06: qty 100

## 2023-05-06 MED ORDER — POTASSIUM CHLORIDE CRYS ER 20 MEQ PO TBCR
40.0000 meq | EXTENDED_RELEASE_TABLET | Freq: Once | ORAL | Status: AC
  Administered 2023-05-06: 40 meq via ORAL
  Filled 2023-05-06: qty 2

## 2023-05-06 NOTE — Progress Notes (Addendum)
PHARMACY - ANTICOAGULATION CONSULT NOTE  Pharmacy Consult for heparin infusion Indication: DVT  Allergies  Allergen Reactions   Beef-Derived Products Hives   Dog Epithelium (Canis Lupus Familiaris) Anaphylaxis   Pork-Derived Products Hives   Bee Venom Hives   Diclofenac Nausea And Vomiting   Lisinopril Other (See Comments)    Headaches    Patient Measurements: Height: 5' 5.5" (166.4 cm) Weight: 61.2 kg (135 lb) IBW/kg (Calculated) : 58.15 Heparin Dosing Weight: 61.2 kg  Vital Signs: Temp: 99.5 F (37.5 C) (11/27 2046) Temp Source: Oral (11/27 2046) BP: 143/82 (11/27 2046) Pulse Rate: 95 (11/27 2046)  Labs: Recent Labs    05/05/23 1140 05/05/23 1351 05/06/23 0058  HGB 12.0  --  10.3*  HCT 36.7  --  31.3*  PLT 447*  --  349  HEPARINUNFRC  --   --  0.14*  CREATININE 0.78  --   --   TROPONINIHS 3 4  --     Estimated Creatinine Clearance: 60.1 mL/min (by C-G formula based on SCr of 0.78 mg/dL).   Medical History: Past Medical History:  Diagnosis Date   Anxiety    Depression    DVT of axillary vein, acute left (HCC)    left subclavian vein   Hypertension    PONV (postoperative nausea and vomiting)    Pulmonary embolus (HCC)    Assessment: 72yoF with chief complaint of chest pain and cough, history of PE/DVT not on anticoagulation. Pt reports feels similar to previous PE. Originally was to initiate Xarelto, however appears pt will be admitted, pharmacy consulted to initiate heparin infusion  05/06/2023 HL 0.14 subtherapeutic on 1050 units Hgb 10.3, plts 349 No interruptions or bleeding per RN  Goal of Therapy:  Heparin level 0.3-0.7 units/ml Monitor platelets by anticoagulation protocol: Yes   Plan:  Bolus 1800 units x 1 Increase heparin drip to 1250 units/hr Heparin level in 8 hours Daily CBC  Arley Phenix RPh 05/06/2023, 1:28 AM

## 2023-05-06 NOTE — Progress Notes (Signed)
Mobility Specialist - Progress Note   05/06/23 1045  Mobility  Activity Ambulated independently in hallway;Ambulated independently to bathroom  Level of Assistance Independent  Assistive Device None  Distance Ambulated (ft) 500 ft  Activity Response Tolerated well  Mobility Referral Yes  $Mobility charge 1 Mobility  Mobility Specialist Start Time (ACUTE ONLY) 1031  Mobility Specialist Stop Time (ACUTE ONLY) 1044  Mobility Specialist Time Calculation (min) (ACUTE ONLY) 13 min   Pt received in bed and agreeable to mobility. No complaints during session. Pt to bathroom after session with all needs met.    [facility]

## 2023-05-06 NOTE — Discharge Summary (Signed)
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 female status post recent fall which was mechanical due to her dog coming between her legs with left-sided rib fractures.  She has history of DVT PE hypertension and lymphocytic colitis.  She is currently not on anticoagulation at the time of admission.   Her fall was on 05/02/2023.. Was seenin ER and prescribed oxycodone since then has been more short of breath and having CP she was worried that she has a PE and came back to ER.  She noticed left upper extremity was swollen and left armpit was swollen. She had a prior hx of DVT/PE no longer on [facility] Reports nonproductive cough continues to have left-sided rib pain no fevers or chills no leg swelling no history of lower extremity DVT.  She has had upper extremity DVTs.    Discharge Diagnoses:  Principal Problem:   DVT (deep venous thrombosis) (HCC) Active Problems:   Essential hypertension   Tobacco abuse   COPD (chronic obstructive pulmonary disease) (HCC)   Multiple rib fractures   Abnormal ECG   Constipation  Provoked DVT (deep venous thrombosis) (HCC) -due to recent fall left rib fractures.patient was initially treated with heparin and changed to Xarelto on discharge.  She was asked to follow-up with her PCP and echocardiogram was ordered by the admitting physician which was not done at the time of discharge.  She will follow-up  with her primary care physician who will arrange for an outpatient echo and hematology referral.   Essential hypertension continue home meds continueTobacco abuse  - Spoke about importance of quitting spent 5 minutes discussing options for treatment, prior attempts at quitting, and dangers of smoking.  Continue nicotine patch.    COPD (chronic obstructive pulmonary disease) (HCC) Chronic not on any oxygen at baseline continue to monitor supportive management encouraged stopping smoking   Multiple rib fractures Continue incentive spirometry and pain management   Abnormal ECG CP related to recent fall reproducible by palpation no dynamic changes on ECG Trop 3, 4 Follow-up echo as an outpatient.  Constipation KUB shows moderate amount of stool she had a large bowel movement prior to discharge and encouraged her to continue taking MiraLAX senna or Dulcolax at home.  Hypokalemia resolved on discharge potassium at 4.5  Estimated body mass index is 22.12 kg/m as calculated from the following:   Height as of this encounter: 5' 5.5" (1.664 m).   Weight as of this encounter: 61.2 kg.  Discharge Instructions  Discharge Instructions     Diet - low sodium heart healthy   Complete by: As directed    Diet - low sodium heart healthy   Complete by: As directed    Diet - low sodium heart healthy   Complete by: As directed    Diet - low sodium heart healthy   Complete by: As directed    Increase activity slowly   Complete by: As directed    Increase activity slowly   Complete by: As directed    Increase activity slowly  Complete by: As directed    Increase activity slowly   Complete by: As directed       Allergies as of 05/06/2023       Reactions   Beef-derived Products Hives   Dog Epithelium (canis Lupus Familiaris) Anaphylaxis   Pork-derived Products Hives   Bee Venom Hives   Diclofenac Nausea And Vomiting   Lisinopril Other (See Comments)   Headaches        Medication  List     STOP taking these medications    aspirin EC 81 MG tablet   betamethasone dipropionate 0.05 % lotion   clobetasol 0.05 % external solution Commonly known as: TEMOVATE   NON FORMULARY   oxyCODONE 5 MG immediate release tablet Commonly known as: Roxicodone   rivaroxaban 20 MG Tabs tablet Commonly known as: XARELTO Replaced by: Xarelto Starter Pack   TYLENOL 500 MG tablet Generic drug: acetaminophen       TAKE these medications    ascorbic acid 500 MG tablet Commonly known as: VITAMIN C Take 500 mg by mouth in the morning.   diphenhydrAMINE 25 mg capsule Commonly known as: BENADRYL Take 25 mg by mouth daily as needed (ONLY FOR ALLERGIC SYMPTOMS).   EPINEPHrine 0.3 mg/0.3 mL Devi Commonly known as: EpiPen Inject 0.3 mLs (0.3 mg total) into the muscle as needed. What changed: reasons to take this   HYDROcodone-acetaminophen 5-325 MG tablet Commonly known as: NORCO/VICODIN Take 1-2 tablets by mouth every 4 (four) hours as needed for moderate pain (pain score 4-6). What changed:  how much to take when to take this   lidocaine 4 % Place 1 patch onto the skin daily as needed (for pain- remove as directed). What changed: Another medication with the same name was removed. Continue taking this medication, and follow the directions you see here.   NICODERM CQ TD Place 1 patch onto the skin daily as needed (for smoking cessation- while hospitalized).   olmesartan 20 MG tablet Commonly known as: BENICAR Take 20 mg by mouth at bedtime.   Sambucus Elderberry Immune Chew Chew 2 tablets by mouth in the morning.   Vitamin D3 250 MCG (10000 UT) Tabs Take 10,000 Units by mouth daily.   Xarelto Starter Pack Generic drug: Rivaroxaban Starter Pack (15 mg and 20 mg) Follow package directions: Take one 15mg  tablet by mouth twice a day. On day 22, switch to one 20mg  tablet once a day. Take with food. Replaces: rivaroxaban 20 MG Tabs tablet   rivaroxaban 2.5 MG Tabs  tablet Commonly known as: XARELTO Take 6 tablets (15 mg total) by mouth 2 (two) times daily with a meal for 21 days, THEN 8 tablets (20 mg total) daily with supper. Start taking on: May 06, 2023        Follow-up Information     [name], MD Follow up.   Specialty: Family Medicine Why: please order echo and check cbc cmp in 1 week Contact information: [address] [place] [address] [phone]                Allergies  Allergen Reactions   Beef-Derived Products Hives   Dog Epithelium (Canis Lupus Familiaris) Anaphylaxis   Pork-Derived Products Hives   Bee Venom Hives   Diclofenac Nausea And Vomiting   Lisinopril Other (See Comments)    Headaches    Consultations: None   Procedures/Studies: DG Abd 1 View  Result Date: 05/06/2023 CLINICAL DATA:  Constipation EXAM: ABDOMEN - 1 VIEW  COMPARISON:  CT abdomen and pelvis 06/24/2018 FINDINGS: Moderate stool in the right colon. Gaseous distention of the small bowel and colon in the left hemiabdomen. No radiographic evidence of obstruction. No acute osseous abnormality. IMPRESSION: Moderate stool in the right colon. Electronically Signed   By: Minerva Fester M.D.   On: 05/06/2023 01:47   US Venous Img Upper Uni Left  Result Date: 05/05/2023 CLINICAL DATA:  Left axillary pain. Left upper extremity DVT seen on CT chest. EXAM: LEFT UPPER EXTREMITY VENOUS DOPPLER ULTRASOUND TECHNIQUE: Gray-scale sonography with graded compression, as well as color Doppler and duplex ultrasound were performed to evaluate the upper extremity deep venous system from the level of the subclavian vein and including the jugular, axillary, basilic, radial, ulnar and upper cephalic vein. Spectral Doppler was utilized to evaluate flow at rest and with distal augmentation maneuvers. COMPARISON:  CT chest from same day. FINDINGS: Contralateral Subclavian Vein: Respiratory phasicity is normal and symmetric with the symptomatic  side. No evidence of thrombus. Normal compressibility. Internal Jugular Vein: No evidence of thrombus. Normal compressibility, respiratory phasicity and response to augmentation. Subclavian Vein: Occlusive and nonocclusive thrombus present. Axillary Vein: Occlusive thrombus present. Cephalic Vein: No evidence of thrombus. Normal compressibility, respiratory phasicity and response to augmentation. Basilic Vein: Nonocclusive thrombus present. Brachial Veins: No evidence of thrombus. Normal compressibility, respiratory phasicity and response to augmentation. Radial Veins: No evidence of thrombus. Normal compressibility, respiratory phasicity and response to augmentation. Ulnar Veins: No evidence of thrombus. Normal compressibility, respiratory phasicity and response to augmentation. Venous Reflux:  None visualized. Other Findings:  None visualized. IMPRESSION: 1. Occlusive DVT in the left subclavian and axillary veins. 2. Nonocclusive superficial thrombus in the left basilic vein. Electronically Signed   By: Obie Dredge M.D.   On: 05/05/2023 15:33   CT Angio Chest PE W and/or Wo Contrast  Result Date: 05/05/2023 CLINICAL DATA:  Pulmonary embolism (PE) suspected, high prob. Shortness of breath. EXAM: CT ANGIOGRAPHY CHEST WITH CONTRAST TECHNIQUE: Multidetector CT imaging of the chest was performed using the standard protocol during bolus administration of intravenous contrast. Multiplanar CT image reconstructions and MIPs were obtained to evaluate the vascular anatomy. RADIATION DOSE REDUCTION: This exam was performed according to the departmental dose-optimization program which includes automated exposure control, adjustment of the mA and/or kV according to patient size and/or use of iterative reconstruction technique. CONTRAST:  75mL OMNIPAQUE IOHEXOL 350 MG/ML SOLN COMPARISON:  CT scan chest from 03/16/2019. FINDINGS: Cardiovascular: No evidence of embolism to the proximal subsegmental pulmonary artery level.  Normal cardiac size. No pericardial effusion. No aortic aneurysm. There are mild peripheral atherosclerotic vascular calcifications of thoracic aorta and its major branches. 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 Further evaluation/confirmation with ultrasound is recommended. Mediastinum/Nodes: Visualized thyroid gland appears grossly unremarkable. No solid / cystic mediastinal masses. The esophagus is nondistended precluding optimal assessment. No axillary, mediastinal or hilar lymphadenopathy by size criteria. Lungs/Pleura: The central tracheo-bronchial tree is patent. There are dependent changes in bilateral lungs. There are also patchy areas of linear, plate-like atelectasis and/or scarring throughout bilateral lungs with predominant lower lobe involvement. There is trace left pleural effusion. No right pleural effusion. No mass or consolidation. No pneumothorax. No suspicious lung nodules. Upper Abdomen: There is stable diffuse thickening of bilateral adrenal glands, without discrete nodule. Findings are nonspecific but mostly associated with adrenal hyperplasia. There  is a subcapsular, 1 cm sized hypoattenuating focus in the right hepatic dome, posteriorly, which is too small to adequately characterize but appears unchanged since the prior study and favored to represent a cyst. Remaining visualized upper abdominal viscera within normal limits. Musculoskeletal: The visualized soft tissues of the chest wall are grossly unremarkable. No suspicious osseous lesions. There are mild multilevel degenerative changes in the visualized spine. Review of the MIP images confirms the above findings. IMPRESSION: 1. No embolism to the proximal subsegmental pulmonary artery level. 2. Findings described above favor  proximal left upper extremity venous system thrombophlebitis. Further evaluation/confirmation with ultrasound Doppler examination is recommended. 3. Multiple other nonacute observations, as described above. Aortic Atherosclerosis (ICD10-I70.0). Electronically Signed   By: Jules Schick M.D.   On: 05/05/2023 14:19   DG Ribs Unilateral W/Chest Left  Result Date: 05/02/2023 CLINICAL DATA:  Tripped over dog leash. Fall. Left rib pain. Hurts to take a deep breath. EXAM: LEFT RIBS AND CHEST - 3+ VIEW COMPARISON:  09/07/2013 FINDINGS: Stable cardiomediastinal silhouette. No focal consolidation, pleural effusion, or pneumothorax. The area of symptomatic concern as indicated by the patient was denoted with a metallic skin BB by the technologist. Nondisplaced fracture of the left ninth rib. IMPRESSION: Nondisplaced fracture of the left ninth rib. Electronically Signed   By: Minerva Fester M.D.   On: 05/02/2023 19:27   (Echo, Carotid, EGD, Colonoscopy, ERCP)    Subjective:  Complaints of left rib pain no new complaints Discharge Exam: Vitals:   05/06/23 0602 05/06/23 1305  BP: 139/80 (!) 159/93  Pulse: 85 81  Resp: 16 16  Temp: 99.1 F (37.3 C) 98.4 F (36.9 C)  SpO2: 94% 97%   Vitals:   05/05/23 2046 05/06/23 0203 05/06/23 0602 05/06/23 1305  BP: (!) 143/82 (!) 140/80 139/80 (!) 159/93  Pulse: 95 84 85 81  Resp: 16 16 16 16   Temp: 99.5 F (37.5 C) 98.7 F (37.1 C) 99.1 F (37.3 C) 98.4 F (36.9 C)  TempSrc: Oral Oral Oral Oral  SpO2: 96% 94% 94% 97%  Weight:      Height:        General: Pt is alert, awake, not in acute distress Cardiovascular: RRR, S1/S2 +, no rubs, no gallops Respiratory: CTA bilaterally, no wheezing, no rhonchi Abdominal: Soft, NT, ND, bowel sounds + Extremities: Left upper extremity edema  The results of significant diagnostics from this hospitalization (including imaging, microbiology, ancillary and laboratory) are listed below for reference.      Microbiology: Recent Results (from the past 240 hour(s))  Resp panel by RT-PCR (RSV, Flu A&B, Covid) Anterior Nasal Swab     Status: None   Collection Time: 05/05/23 11:40 AM   Specimen: Anterior Nasal Swab  Result Value Ref Range Status   SARS Coronavirus 2 by RT PCR NEGATIVE NEGATIVE Final    Comment: (NOTE) SARS-CoV-2 target nucleic acids are NOT DETECTED.  The SARS-CoV-2 RNA is generally detectable in upper respiratory specimens during the acute phase of infection. The lowest concentration of SARS-CoV-2 viral copies this assay can detect is 138 copies/mL. A negative result does not preclude SARS-Cov-2 infection and should not be used as the sole basis for treatment or other patient management decisions. A negative result may occur with  improper specimen collection/handling, submission of specimen other than nasopharyngeal swab, presence of viral mutation(s) within the areas targeted by this assay, and inadequate number of viral copies(<138 copies/mL). A negative result must be combined with clinical observations, patient history, and epidemiological  information. The expected result is Negative.  Fact Sheet for Patients:  BloggerCourse.com  Fact Sheet for Healthcare Providers:  SeriousBroker.it  This test is no t yet approved or cleared by the [place] FDA and  has been authorized for detection and/or diagnosis of SARS-CoV-2 by FDA under an Emergency Use Authorization (EUA). This EUA will remain  in effect (meaning this test can be used) for the duration of the COVID-19 declaration under Section 564(b)(1) of the Act, 21 U.S.C.section 360bbb-3(b)(1), unless the authorization is terminated  or revoked sooner.       Influenza A by PCR NEGATIVE NEGATIVE Final   Influenza B by PCR NEGATIVE NEGATIVE Final    Comment: (NOTE) The Xpert Xpress SARS-CoV-2/FLU/RSV plus assay is intended as an aid in the diagnosis of  influenza from Nasopharyngeal swab specimens and should not be used as a sole basis for treatment. Nasal washings and aspirates are unacceptable for Xpert Xpress SARS-CoV-2/FLU/RSV testing.  Fact Sheet for Patients: BloggerCourse.com  Fact Sheet for Healthcare Providers: SeriousBroker.it  This test is not yet approved or cleared by the [place] FDA and has been authorized for detection and/or diagnosis of SARS-CoV-2 by FDA under an Emergency Use Authorization (EUA). This EUA will remain in effect (meaning this test can be used) for the duration of the COVID-19 declaration under Section 564(b)(1) of the Act, 21 U.S.C. section 360bbb-3(b)(1), unless the authorization is terminated or revoked.     Resp Syncytial Virus by PCR NEGATIVE NEGATIVE Final    Comment: (NOTE) Fact Sheet for Patients: BloggerCourse.com  Fact Sheet for Healthcare Providers: SeriousBroker.it  This test is not yet approved or cleared by the [place] FDA and has been authorized for detection and/or diagnosis of SARS-CoV-2 by FDA under an Emergency Use Authorization (EUA). This EUA will remain in effect (meaning this test can be used) for the duration of the COVID-19 declaration under Section 564(b)(1) of the Act, 21 U.S.C. section 360bbb-3(b)(1), unless the authorization is terminated or revoked.  Performed at [facility], [address], [place], [place] [address]      Labs: BNP (last 3 results) Recent Labs    05/05/23 1140  BNP 59.0   Basic Metabolic Panel: Recent Labs  Lab 05/05/23 1140 05/06/23 0058 05/06/23 1203  NA 136 133* 136  K 3.9 3.0* 4.5  CL 101 102 104  CO2 27 23 25   GLUCOSE 135* 98 93  BUN 19 20 15   CREATININE 0.78 0.72 0.62  CALCIUM 9.6 8.5* 9.1  MG  --  2.0  --   PHOS  --  3.5  --    Liver Function Tests: Recent Labs  Lab 05/06/23 0058   AST 10*  ALT 10  ALKPHOS 61  BILITOT 0.5  PROT 5.6*  ALBUMIN 2.8*   No results for input(s): "LIPASE", "AMYLASE" in the last 168 hours. No results for input(s): "AMMONIA" in the last 168 hours. CBC: Recent Labs  Lab 05/05/23 1140 05/06/23 0058  WBC 14.7* 10.6*  NEUTROABS 12.1*  --   HGB 12.0 10.3*  HCT 36.7 31.3*  MCV 82.3 83.5  PLT 447* 349   Cardiac Enzymes: No results for input(s): "CKTOTAL", "CKMB", "CKMBINDEX", "TROPONINI" in the last 168 hours. BNP: Invalid input(s): "POCBNP" CBG: No results for input(s): "GLUCAP" in the last 168 hours. D-Dimer No results for input(s): "DDIMER" in the last 72 hours. Hgb A1c Recent Labs    05/06/23 0058  HGBA1C 6.0*   Lipid Profile No results for input(s): "CHOL", "HDL", "  LDLCALC", "TRIG", "CHOLHDL", "LDLDIRECT" in the last 72 hours. Thyroid function studies No results for input(s): "TSH", "T4TOTAL", "T3FREE", "THYROIDAB" in the last 72 hours.  Invalid input(s): "FREET3" Anemia work up No results for input(s): "VITAMINB12", "FOLATE", "FERRITIN", "TIBC", "IRON", "RETICCTPCT" in the last 72 hours. Urinalysis    Component Value Date/Time   COLORURINE YELLOW 06/12/2018 0055   APPEARANCEUR CLEAR 06/12/2018 0055   LABSPEC 1.014 06/12/2018 0055   PHURINE 6.0 06/12/2018 0055   GLUCOSEU NEGATIVE 06/12/2018 0055   HGBUR MODERATE (A) 06/12/2018 0055   BILIRUBINUR NEGATIVE 06/12/2018 0055   KETONESUR 5 (A) 06/12/2018 0055   PROTEINUR NEGATIVE 06/12/2018 0055   NITRITE NEGATIVE 06/12/2018 0055   LEUKOCYTESUR NEGATIVE 06/12/2018 0055   Sepsis Labs Recent Labs  Lab 05/05/23 1140 05/06/23 0058  WBC 14.7* 10.6*   Microbiology Recent Results (from the past 240 hour(s))  Resp panel by RT-PCR (RSV, Flu A&B, Covid) Anterior Nasal Swab     Status: None   Collection Time: 05/05/23 11:40 AM   Specimen: Anterior Nasal Swab  Result Value Ref Range Status   SARS Coronavirus 2 by RT PCR NEGATIVE NEGATIVE Final    Comment:  (NOTE) SARS-CoV-2 target nucleic acids are NOT DETECTED.  The SARS-CoV-2 RNA is generally detectable in upper respiratory specimens during the acute phase of infection. The lowest concentration of SARS-CoV-2 viral copies this assay can detect is 138 copies/mL. A negative result does not preclude SARS-Cov-2 infection and should not be used as the sole basis for treatment or other patient management decisions. A negative result may occur with  improper specimen collection/handling, submission of specimen other than nasopharyngeal swab, presence of viral mutation(s) within the areas targeted by this assay, and inadequate number of viral copies(<138 copies/mL). A negative result must be combined with clinical observations, patient history, and epidemiological information. The expected result is Negative.  Fact Sheet for Patients:  BloggerCourse.com  Fact Sheet for Healthcare Providers:  SeriousBroker.it  This test is no t yet approved or cleared by the [place] FDA and  has been authorized for detection and/or diagnosis of SARS-CoV-2 by FDA under an Emergency Use Authorization (EUA). This EUA will remain  in effect (meaning this test can be used) for the duration of the COVID-19 declaration under Section 564(b)(1) of the Act, 21 U.S.C.section 360bbb-3(b)(1), unless the authorization is terminated  or revoked sooner.       Influenza A by PCR NEGATIVE NEGATIVE Final   Influenza B by PCR NEGATIVE NEGATIVE Final    Comment: (NOTE) The Xpert Xpress SARS-CoV-2/FLU/RSV plus assay is intended as an aid in the diagnosis of influenza from Nasopharyngeal swab specimens and should not be used as a sole basis for treatment. Nasal washings and aspirates are unacceptable for Xpert Xpress SARS-CoV-2/FLU/RSV testing.  Fact Sheet for Patients: BloggerCourse.com  Fact Sheet for Healthcare  Providers: SeriousBroker.it  This test is not yet approved or cleared by the [place] FDA and has been authorized for detection and/or diagnosis of SARS-CoV-2 by FDA under an Emergency Use Authorization (EUA). This EUA will remain in effect (meaning this test can be used) for the duration of the COVID-19 declaration under Section 564(b)(1) of the Act, 21 U.S.C. section 360bbb-3(b)(1), unless the authorization is terminated or revoked.     Resp Syncytial Virus by PCR NEGATIVE NEGATIVE Final    Comment: (NOTE) Fact Sheet for Patients: BloggerCourse.com  Fact Sheet for Healthcare Providers: SeriousBroker.it  This test is not yet approved or cleared by the [place] and  has been authorized for detection and/or diagnosis of SARS-CoV-2 by FDA under an Emergency Use Authorization (EUA). This EUA will remain in effect (meaning this test can be used) for the duration of the COVID-19 declaration under Section 564(b)(1) of the Act, 21 U.S.C. section 360bbb-3(b)(1), unless the authorization is terminated or revoked.  Performed at [facility], [address], [place], [place] [address]      Time coordinating discharge: 39 minutes  SIGNED:  Alwyn Ren, MD  [facility] 05/06/2023, 4:28 PM

## 2023-05-06 NOTE — Assessment & Plan Note (Signed)
Bowel regime and order KUB

## 2023-05-06 NOTE — Progress Notes (Addendum)
PHARMACY - ANTICOAGULATION CONSULT NOTE  Pharmacy Consult for Eliquis Indication: DVT  Allergies  Allergen Reactions   Beef-Derived Products Hives   Dog Epithelium (Canis Lupus Familiaris) Anaphylaxis   Pork-Derived Products Hives   Bee Venom Hives   Diclofenac Nausea And Vomiting   Lisinopril Other (See Comments)    Headaches    Patient Measurements: Height: 5' 5.5" (166.4 cm) Weight: 61.2 kg (135 lb) IBW/kg (Calculated) : 58.15 Heparin Dosing Weight: 61.2 kg  Vital Signs: Temp: 99.1 F (37.3 C) (11/28 0602) Temp Source: Oral (11/28 0602) BP: 139/80 (11/28 0602) Pulse Rate: 85 (11/28 0602)  Labs: Recent Labs    05/05/23 1140 05/05/23 1351 05/06/23 0058  HGB 12.0  --  10.3*  HCT 36.7  --  31.3*  PLT 447*  --  349  HEPARINUNFRC  --   --  0.14*  CREATININE 0.78  --  0.72  TROPONINIHS 3 4  --     Estimated Creatinine Clearance: 60.1 mL/min (by C-G formula based on SCr of 0.72 mg/dL).   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 Pt reports feels similar to previous PE. Ultrasound upper extremity positive for occlusive DVT in the left subclavian and axillary veins and nonocclusive superficial thrombus in the left basilic vein. Originally was to initiate Xarelto, however patient admitted and started on heparin infusion. Pharmacy now consulted to transition to Eliquis.   Goal of Therapy:  Heparin level 0.3-0.7 units/ml Monitor platelets by anticoagulation protocol: Yes   Plan:  -Stop heparin at 1000 with first dose of Eliquis -Start Eliquis 10 mg BID x 7 days followed by 5 mg BID -Pharmacy to provide education prior to discharge  ADDENDUM: Change Eliquis to  Xarelto given pt received Xarelto starter pack 11/27 prior to decision to admit on heparin. -Order for Xarelto 15 mg BID with food x 21 days followed by 20 mg daily  Pricilla Riffle, PharmD, BCPS Clinical Pharmacist 05/06/2023 9:34 AM

## 2023-05-06 NOTE — Assessment & Plan Note (Signed)
CP related to recent fall reproducible by palpation no dynamic changes on ECG Trop 3, 4 For completion obtain baseline echo in AM  If abnormal may need cardiology follow up

## 2023-05-06 NOTE — Progress Notes (Signed)
   05/06/23 1140  TOC Brief Assessment  Insurance and Status Reviewed  Patient has primary care physician Yes  Home environment has been reviewed Home w/ son  Prior level of function: Independent  Prior/Current Home Services No current home services  Social Determinants of Health Reivew SDOH reviewed no interventions necessary  Readmission risk has been reviewed Yes  Transition of care needs no transition of care needs at this time

## 2023-05-06 NOTE — Discharge Instructions (Addendum)
Information on my medicine - XARELTO (rivaroxaban)   WHY WAS XARELTO PRESCRIBED FOR YOU? Xarelto was prescribed to treat blood clots that may have been found in the veins of your legs (deep vein thrombosis) or in your lungs (pulmonary embolism) and to reduce the risk of them occurring again.  What do you need to know about Xarelto? The starting dose is one 15 mg tablet taken TWICE daily with food for the FIRST 21 DAYS then on 05/27/23  the dose is changed to one 20 mg tablet taken ONCE A DAY with your evening meal.  DO NOT stop taking Xarelto without talking to the health care provider who prescribed the medication.  Refill your prescription for 20 mg tablets before you run out.  After discharge, you should have regular check-up appointments with your healthcare provider that is prescribing your Xarelto.  In the future your dose may need to be changed if your kidney function changes by a significant amount.  What do you do if you miss a dose? If you are taking Xarelto TWICE DAILY and you miss a dose, take it as soon as you remember. You may take two 15 mg tablets (total 30 mg) at the same time then resume your regularly scheduled 15 mg twice daily the next day.  If you are taking Xarelto ONCE DAILY and you miss a dose, take it as soon as you remember on the same day then continue your regularly scheduled once daily regimen the next day. Do not take two doses of Xarelto at the same time.   Important Safety Information Xarelto is a blood thinner medicine that can cause bleeding. You should call your healthcare provider right away if you experience any of the following: Bleeding from an injury or your nose that does not stop. Unusual colored urine (red or dark brown) or unusual colored stools (red or black). Unusual bruising for unknown reasons. A serious fall or if you hit your head (even if there is no bleeding).  Some medicines may interact with Xarelto and might increase your  risk of bleeding while on Xarelto. To help avoid this, consult your healthcare provider or pharmacist prior to using any new prescription or non-prescription medications, including herbals, vitamins, non-steroidal anti-inflammatory drugs (NSAIDs) and supplements.  This website has more information on Xarelto: VisitDestination.com.br.

## 2023-05-06 NOTE — Care Management Obs Status (Signed)
MEDICARE OBSERVATION STATUS NOTIFICATION   Patient Details  Name: Tina Mccullough MRN: [id number] Date of Birth: 1950/11/06   Medicare Observation Status Notification Given:  Yes    Otelia Santee, LCSW 05/06/2023, 11:31 AM

## 2023-05-11 ENCOUNTER — Other Ambulatory Visit (HOSPITAL_COMMUNITY): Payer: Self-pay

## 2023-05-11 DIAGNOSIS — D6869 Other thrombophilia: Secondary | ICD-10-CM | POA: Diagnosis not present

## 2023-05-11 DIAGNOSIS — N182 Chronic kidney disease, stage 2 (mild): Secondary | ICD-10-CM | POA: Diagnosis not present

## 2023-05-11 DIAGNOSIS — Z720 Tobacco use: Secondary | ICD-10-CM | POA: Diagnosis not present

## 2023-05-11 DIAGNOSIS — I10 Essential (primary) hypertension: Secondary | ICD-10-CM | POA: Diagnosis not present

## 2023-05-11 DIAGNOSIS — S2242XK Multiple fractures of ribs, left side, subsequent encounter for fracture with nonunion: Secondary | ICD-10-CM | POA: Diagnosis not present

## 2023-05-11 DIAGNOSIS — I82622 Acute embolism and thrombosis of deep veins of left upper extremity: Secondary | ICD-10-CM | POA: Diagnosis not present

## 2023-05-11 MED ORDER — OXYCODONE HCL 5 MG PO TABS
5.0000 mg | ORAL_TABLET | Freq: Three times a day (TID) | ORAL | 0 refills | Status: DC | PRN
  Filled 2023-05-11: qty 20, 7d supply, fill #0

## 2023-05-11 MED ORDER — XARELTO 20 MG PO TABS
20.0000 mg | ORAL_TABLET | Freq: Every day | ORAL | 11 refills | Status: DC
  Filled 2023-05-11: qty 30, 30d supply, fill #0
  Filled 2023-07-19: qty 30, 30d supply, fill #1
  Filled 2023-08-11: qty 30, 30d supply, fill #2
  Filled 2023-09-13: qty 30, 30d supply, fill #3
  Filled 2023-10-11: qty 30, 30d supply, fill #4
  Filled 2023-11-19: qty 30, 30d supply, fill #5
  Filled 2023-12-21: qty 30, 30d supply, fill #6
  Filled 2024-01-12 – 2024-01-13 (×2): qty 30, 30d supply, fill #7

## 2023-06-08 ENCOUNTER — Other Ambulatory Visit (HOSPITAL_COMMUNITY): Payer: Self-pay

## 2023-06-08 DIAGNOSIS — M25562 Pain in left knee: Secondary | ICD-10-CM | POA: Diagnosis not present

## 2023-06-08 DIAGNOSIS — M25561 Pain in right knee: Secondary | ICD-10-CM | POA: Diagnosis not present

## 2023-06-08 MED ORDER — HYDROCODONE-ACETAMINOPHEN 5-325 MG PO TABS
1.0000 | ORAL_TABLET | ORAL | 0 refills | Status: AC | PRN
  Filled 2023-06-08: qty 30, 5d supply, fill #0

## 2023-06-10 ENCOUNTER — Other Ambulatory Visit (HOSPITAL_COMMUNITY): Payer: Self-pay

## 2023-06-11 ENCOUNTER — Other Ambulatory Visit (HOSPITAL_COMMUNITY): Payer: Self-pay

## 2023-06-13 NOTE — Progress Notes (Signed)
 [facility] CONSULT NOTE  Patient Care Team: Loreli Kins, MD as PCP - General (Family Medicine) Livingston Rigg, MD (Inactive) as Consulting Physician (Dermatology)   ASSESSMENT & PLAN:  73 y.o.female with history of hypertension, DVT referred to [facility] for history of acute deep vein thrombosis (DVT) of other vein of left upper extremity.  First episode: unprovoked about age 38 2nd episode: provoked about 2020 after GS for dental surgery 3rd episode: 04/2023 LUE. Setting: Provoked left upper extremity DVT for fall and fracture Treatment: Rivaroxaban   Discussed provoked versus unprovoked VTE today.  Patient had unprovoked event about age 71 however without additional episode after several decades later without anticoagulation.  Last 2 episodes were at the same extremity and both were provoked.  She also recently quitted smoking after last DVT. Discussed continue cessation is important to lower her risk of another DVT. She recovered really well without additional symptoms.  CT scan did not show any abnormal musculoskeletal findings at the first rib as reported.  Discussed risk and benefit of thrombophilia testing.  In the case, most importantly should consider postoperative prophylaxis.  Recommend complete 3 months of anticoagulation as able. If need surgery in the next 4 months, would continue anticoagulation for 3 months post surgery.  Patient education for risk factors and prevention of clotting We talked about modifiable risk factors.  Prevention of clotting like deep vein thrombosis including: Strong risk factors including fractures of lower limb, hospitalization for severe illness, such as heart failure, myocardial infarction, spinal cord injury, major trauma, hip or knee replacement, and previous VTE. avoid prolonged immobilization and moving extremities every 1-2 hours during long car rides or flights.  Taking a break and moving  extremities if working in a job setting with prolonged sitting.   Avoid dehydration, especially in high altitude. Avoid cigarette smoking Avoid use of hormone replacement therapy, estrogen-containing contraceptive in women.   Maintaining healthy lifestyle to prevent development of diabetes.  Weight loss if BMI over 30.  Regular exercises but not extreme.   Other risk factors for clotting are surgery, hospitalization, inflammatory disease or severe infection, trauma or injuries from inflammatory state and stasis. If developing one-sided leg swelling, pain, color change, chest pain, sudden short of breath, difficulty taking deep breaths, taking deep breath with chest discomfort or pain, dizziness or heart racing sensation, go to the emergency room immediately for evaluation. If developing trauma, uncontrolled bleeding, such as bloody stools report ED immediately. Avoid NSAIDs, aspirin while on blood thinner.  Discussed bleeding precautions and avoid high risk activities for falling while on anticoagulation. Anticoagulants do not cause bleeding.  Rather, if bleeding occurs when one is on anticoagulation, it may take longer for the bleeding to stop due to reduce coagulation capacity.  I have not made a return appointment for the patient to come back. I would be happy to assist in perioperative DVT management in the future should she need any assistance.  All questions were answered. The patient knows to call the clinic with any problems, questions or concerns.  Tina JAYSON Chihuahua, MD 1/6/20253:20 PM  CHIEF COMPLAINTS/PURPOSE OF CONSULTATION:  DVT  HISTORY OF PRESENTING ILLNESS:  Tina Mccullough 73 y.o. female is here because of DVT  Age about [age] LUE DVT. No pregnancy, OCP, no trauma or injury. She was at beach and returned with swelling arm and pain. Report had surgery removal of clot. She cannot remember if she taken blood thinner.  2020 post dental surgery  under general anesthesia  and swelling and pain. Found DVT LUE and was on warfarin and then Xarelto  and stopped.  Macario had a  fall on 11/24 resulting in rib fractures she tripped and hit the left side of her ribs on dog cage  05/02/23 XR Nondisplaced fracture of the left ninth rib.  Started swelling and pain started the next day felt like she had before.  05/05/23 US : 1. Occlusive DVT in the left subclavian and axillary veins. 2. Nonocclusive superficial thrombus in the left basilic vein.  05/05/23 CTA IMPRESSION: 1. No embolism to the proximal subsegmental pulmonary artery level. 2. Findings described above favor proximal left upper extremity venous system thrombophlebitis. Further evaluation/confirmation with ultrasound Doppler examination is recommended. 3. Multiple other nonacute observations, as described above.  She was started on heparin  and changed to Xarelto . She reports swelling resolved in 4-5 days.  She has 2 brothers but not clotting. No family history of any DVT or PE.   She torn ACL and had repair and not DVT afterward but was on blood thinner after surgery for about 1 week.  She smokes and quitted in Nov 2024.  No DVT in the legs.  Report pending MRI by ortho on the left knee and may need surgery.  She is otherwise feeling well. No night sweats, weight loss or decreased appetite.  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   Procedure: SURGICAL REMOVAL OF ODOTOGENIC  TUMOR AND IMPACTED TOOTH NUMBER 17;  Surgeon: Ezzie Pinal, DDS;  Location: [facility];  Service: Oral Surgery;  Laterality: Left;   HAND SURGERY Bilateral     for trigger fingers    SOCIAL HISTORY: Social History   Socioeconomic History   Marital status: Married    Spouse name: Not on file   Number of children: Not on file   Years of education: Not on file   Highest education level: Not on file  Occupational History   Not on file  Tobacco Use   Smoking status: Every Day    Current packs/day: 1.00    Average packs/day: 1 pack/day for 37.0 years (37.0 ttl pk-yrs)    Types: E-cigarettes, Cigarettes   Smokeless tobacco: Never   Tobacco comments:    Counseled on reduce to quit. Information on classes shared  Vaping Use   Vaping status: Never Used  Substance and Sexual Activity   Alcohol use: No   Drug use: No   Sexual activity: Not on file  Other Topics Concern   Not on file  Social History Narrative   Not on file   Social Drivers of Health   Financial Resource Strain: Not on file  Food Insecurity: No Food Insecurity (05/05/2023)   Hunger Vital Sign    Worried About Running Out of Food in the Last Year: Never true    Ran Out of Food in the Last Year: Never true  Transportation Needs: No Transportation Needs (05/05/2023)   PRAPARE - Administrator, Civil Service (Medical): No    Lack of Transportation (Non-Medical): No  Physical Activity: Not on file  Stress: Not on file  Social Connections: Not on file  Intimate Partner Violence: Not At Risk (05/05/2023)   Humiliation, Afraid, Rape, and Kick questionnaire    Fear of Current or Ex-Partner: No    Emotionally Abused: No    Physically Abused: No    Sexually Abused: No    FAMILY HISTORY: No family history on file.  ALLERGIES:  is allergic to beef-derived drug products, dog epithelium (canis lupus familiaris), pork-derived products, bee venom, diclofenac, and lisinopril.  MEDICATIONS:  Current Outpatient Medications  Medication Sig Dispense Refill   ascorbic acid  (VITAMIN C ) 500 MG tablet Take 500 mg by mouth in the morning.     Cholecalciferol (VITAMIN  D3) 250 MCG (10000 UT) TABS Take 10,000 Units by mouth daily.     diphenhydrAMINE (BENADRYL) 25 mg capsule Take 25 mg by mouth daily as needed (ONLY FOR ALLERGIC SYMPTOMS).     Misc Natural Products (SAMBUCUS ELDERBERRY IMMUNE) CHEW Chew 2 tablets by mouth in the morning.     Nicotine  (NICODERM CQ  TD) Place 1 patch onto the skin daily as needed (for smoking cessation- while hospitalized).     olmesartan  (BENICAR ) 20 MG tablet Take 20 mg by mouth at bedtime.     oxyCODONE  (OXY IR/ROXICODONE ) 5 MG immediate release tablet Take 1 tablet (5 mg total) by mouth 3 (three) times daily as needed for rib fracture pain 20 tablet 0   rivaroxaban  (XARELTO ) 20 MG TABS tablet Take 1 tablet (20 mg total) by mouth daily with food 30 tablet 11   EPINEPHrine  (EPIPEN ) 0.3 mg/0.3 mL DEVI Inject 0.3 mLs (0.3 mg total) into the muscle as needed. (Patient not taking: Reported on 06/14/2023) 2 Device 1   HYDROcodone -acetaminophen  (NORCO/VICODIN) 5-325 MG tablet Take 1-2 tablets by mouth every 4 (four) hours as needed for moderate pain (pain score 4-6). 30 tablet 0   lidocaine  4 % Place 1 patch onto the skin daily as needed (for pain- remove as directed). (Patient not taking: Reported on 06/14/2023)     No current facility-administered medications for this visit.    REVIEW OF SYSTEMS:   All relevant systems were reviewed with the patient and are negative.  PHYSICAL EXAMINATION:  Vitals:   06/14/23 1336  BP: (!) 156/80  Pulse: 100  Resp: 16  Temp: 97.6 F (36.4 C)  SpO2: 100%   Filed Weights   06/14/23 1336  Weight: 144 lb 3.2 oz (65.4 kg)    GENERAL: alert, no distress and comfortable SKIN: skin color normal LUNGS: Effort normal and no respiratory distress. Able to take deep breaths without pain Musculoskeletal: No edema in all extremities  LABORATORY DATA:  I have reviewed the data as listed   RADIOGRAPHIC STUDIES: I have personally reviewed the radiological images as listed and agreed with the  findings in the report. No results found.

## 2023-06-14 ENCOUNTER — Inpatient Hospital Stay: Payer: PPO

## 2023-06-14 VITALS — BP 156/80 | HR 100 | Temp 97.6°F | Resp 16 | Ht 65.5 in | Wt 144.2 lb

## 2023-06-14 DIAGNOSIS — F17290 Nicotine dependence, other tobacco product, uncomplicated: Secondary | ICD-10-CM | POA: Diagnosis not present

## 2023-06-14 DIAGNOSIS — Z86718 Personal history of other venous thrombosis and embolism: Secondary | ICD-10-CM | POA: Insufficient documentation

## 2023-06-14 DIAGNOSIS — I82621 Acute embolism and thrombosis of deep veins of right upper extremity: Secondary | ICD-10-CM | POA: Diagnosis not present

## 2023-06-14 DIAGNOSIS — I82622 Acute embolism and thrombosis of deep veins of left upper extremity: Secondary | ICD-10-CM | POA: Insufficient documentation

## 2023-06-14 DIAGNOSIS — I10 Essential (primary) hypertension: Secondary | ICD-10-CM

## 2023-06-14 NOTE — Patient Instructions (Signed)
 Recommend complete 3 months of anticoagulation as able. If need surgery in the next 4 months, would continue anticoagulation for 3 months post surgery.  Patient education for risk factors and prevention of clotting We talked about modifiable risk factors.  Prevention of clotting like deep vein thrombosis including: Strong risk factors including fractures of lower limb, hospitalization for severe illness, such as heart failure, myocardial infarction, spinal cord injury, major trauma, hip or knee replacement, and previous VTE. avoid prolonged immobilization and moving extremities every 1-2 hours during long car rides or flights.  Taking a break and moving extremities if working in a job setting with prolonged sitting.   Avoid dehydration, especially in high altitude. Avoid cigarette smoking Avoid use of hormone replacement therapy, estrogen-containing contraceptive in women.   Maintaining healthy lifestyle to prevent development of diabetes.  Weight loss if BMI over 30.  Regular exercises but not extreme.   Other risk factors for clotting are surgery, hospitalization, inflammatory disease or severe infection, trauma or injuries from inflammatory state and stasis. If developing one-sided leg swelling, pain, color change, chest pain, sudden short of breath, difficulty taking deep breaths, taking deep breath with chest discomfort or pain, dizziness or heart racing sensation, go to the emergency room immediately for evaluation. If developing trauma, uncontrolled bleeding, such as bloody stools report ED immediately. Avoid NSAIDs, aspirin while on blood thinner.  Discussed bleeding precautions and avoid high risk activities for falling while on anticoagulation. Anticoagulants do not cause bleeding.  Rather, if bleeding occurs when one is on anticoagulation, it may take longer for the bleeding to stop due to reduce coagulation capacity.  I have not made a return appointment for the patient to come back.  I would be happy to assist in perioperative DVT management in the future should she need any assistance.  All questions were answered. The patient knows to call the clinic with any problems, questions or concerns.

## 2023-07-01 ENCOUNTER — Inpatient Hospital Stay (HOSPITAL_BASED_OUTPATIENT_CLINIC_OR_DEPARTMENT_OTHER)
Admission: EM | Admit: 2023-07-01 | Discharge: 2023-07-17 | DRG: 329 | Disposition: A | Discharge status: Home Health | Payer: PPO | Attending: Internal Medicine | Admitting: Internal Medicine

## 2023-07-01 ENCOUNTER — Other Ambulatory Visit: Payer: Self-pay

## 2023-07-01 ENCOUNTER — Ambulatory Visit
Admission: RE | Admit: 2023-07-01 | Discharge: 2023-07-01 | Disposition: A | Discharge status: Home / Self Care | Payer: PPO | Source: Ambulatory Visit | Attending: Internal Medicine | Admitting: Internal Medicine

## 2023-07-01 ENCOUNTER — Other Ambulatory Visit: Payer: Self-pay | Admitting: Internal Medicine

## 2023-07-01 DIAGNOSIS — C183 Malignant neoplasm of hepatic flexure: Secondary | ICD-10-CM | POA: Diagnosis not present

## 2023-07-01 DIAGNOSIS — Z7901 Long term (current) use of anticoagulants: Secondary | ICD-10-CM

## 2023-07-01 DIAGNOSIS — R4589 Other symptoms and signs involving emotional state: Secondary | ICD-10-CM | POA: Diagnosis not present

## 2023-07-01 DIAGNOSIS — D62 Acute posthemorrhagic anemia: Secondary | ICD-10-CM | POA: Diagnosis not present

## 2023-07-01 DIAGNOSIS — Z716 Tobacco abuse counseling: Secondary | ICD-10-CM

## 2023-07-01 DIAGNOSIS — K6389 Other specified diseases of intestine: Secondary | ICD-10-CM | POA: Diagnosis not present

## 2023-07-01 DIAGNOSIS — I82623 Acute embolism and thrombosis of deep veins of upper extremity, bilateral: Secondary | ICD-10-CM | POA: Diagnosis not present

## 2023-07-01 DIAGNOSIS — J449 Chronic obstructive pulmonary disease, unspecified: Secondary | ICD-10-CM | POA: Diagnosis not present

## 2023-07-01 DIAGNOSIS — Z7189 Other specified counseling: Secondary | ICD-10-CM

## 2023-07-01 DIAGNOSIS — D509 Iron deficiency anemia, unspecified: Secondary | ICD-10-CM | POA: Diagnosis present

## 2023-07-01 DIAGNOSIS — K648 Other hemorrhoids: Secondary | ICD-10-CM | POA: Diagnosis present

## 2023-07-01 DIAGNOSIS — Z79899 Other long term (current) drug therapy: Secondary | ICD-10-CM

## 2023-07-01 DIAGNOSIS — I82622 Acute embolism and thrombosis of deep veins of left upper extremity: Secondary | ICD-10-CM | POA: Diagnosis not present

## 2023-07-01 DIAGNOSIS — F32A Depression, unspecified: Secondary | ICD-10-CM | POA: Diagnosis not present

## 2023-07-01 DIAGNOSIS — D75839 Thrombocytosis, unspecified: Secondary | ICD-10-CM | POA: Diagnosis not present

## 2023-07-01 DIAGNOSIS — R1031 Right lower quadrant pain: Secondary | ICD-10-CM

## 2023-07-01 DIAGNOSIS — E876 Hypokalemia: Secondary | ICD-10-CM | POA: Diagnosis present

## 2023-07-01 DIAGNOSIS — Z515 Encounter for palliative care: Secondary | ICD-10-CM | POA: Diagnosis not present

## 2023-07-01 DIAGNOSIS — C184 Malignant neoplasm of transverse colon: Secondary | ICD-10-CM | POA: Diagnosis not present

## 2023-07-01 DIAGNOSIS — K5730 Diverticulosis of large intestine without perforation or abscess without bleeding: Secondary | ICD-10-CM | POA: Diagnosis present

## 2023-07-01 DIAGNOSIS — K921 Melena: Secondary | ICD-10-CM | POA: Diagnosis not present

## 2023-07-01 DIAGNOSIS — K922 Gastrointestinal hemorrhage, unspecified: Secondary | ICD-10-CM | POA: Insufficient documentation

## 2023-07-01 DIAGNOSIS — R1310 Dysphagia, unspecified: Secondary | ICD-10-CM | POA: Diagnosis not present

## 2023-07-01 DIAGNOSIS — N814 Uterovaginal prolapse, unspecified: Secondary | ICD-10-CM | POA: Diagnosis present

## 2023-07-01 DIAGNOSIS — M7989 Other specified soft tissue disorders: Secondary | ICD-10-CM | POA: Diagnosis present

## 2023-07-01 DIAGNOSIS — T8149XA Infection following a procedure, other surgical site, initial encounter: Secondary | ICD-10-CM | POA: Insufficient documentation

## 2023-07-01 DIAGNOSIS — K658 Other peritonitis: Secondary | ICD-10-CM | POA: Diagnosis not present

## 2023-07-01 DIAGNOSIS — T8131XA Disruption of external operation (surgical) wound, not elsewhere classified, initial encounter: Secondary | ICD-10-CM | POA: Diagnosis not present

## 2023-07-01 DIAGNOSIS — Z86718 Personal history of other venous thrombosis and embolism: Secondary | ICD-10-CM

## 2023-07-01 DIAGNOSIS — J90 Pleural effusion, not elsewhere classified: Secondary | ICD-10-CM | POA: Diagnosis not present

## 2023-07-01 DIAGNOSIS — K5669 Other partial intestinal obstruction: Secondary | ICD-10-CM | POA: Diagnosis not present

## 2023-07-01 DIAGNOSIS — C189 Malignant neoplasm of colon, unspecified: Secondary | ICD-10-CM | POA: Diagnosis not present

## 2023-07-01 DIAGNOSIS — F17210 Nicotine dependence, cigarettes, uncomplicated: Secondary | ICD-10-CM | POA: Diagnosis present

## 2023-07-01 DIAGNOSIS — Z86711 Personal history of pulmonary embolism: Secondary | ICD-10-CM

## 2023-07-01 DIAGNOSIS — Z91030 Bee allergy status: Secondary | ICD-10-CM

## 2023-07-01 DIAGNOSIS — I10 Essential (primary) hypertension: Secondary | ICD-10-CM | POA: Diagnosis present

## 2023-07-01 DIAGNOSIS — Z888 Allergy status to other drugs, medicaments and biological substances status: Secondary | ICD-10-CM

## 2023-07-01 DIAGNOSIS — F419 Anxiety disorder, unspecified: Secondary | ICD-10-CM | POA: Diagnosis not present

## 2023-07-01 DIAGNOSIS — Z720 Tobacco use: Secondary | ICD-10-CM | POA: Diagnosis present

## 2023-07-01 DIAGNOSIS — Y838 Other surgical procedures as the cause of abnormal reaction of the patient, or of later complication, without mention of misadventure at the time of the procedure: Secondary | ICD-10-CM | POA: Diagnosis not present

## 2023-07-01 DIAGNOSIS — Z91014 Allergy to mammalian meats: Secondary | ICD-10-CM

## 2023-07-01 DIAGNOSIS — I82409 Acute embolism and thrombosis of unspecified deep veins of unspecified lower extremity: Secondary | ICD-10-CM | POA: Diagnosis present

## 2023-07-01 DIAGNOSIS — R238 Other skin changes: Secondary | ICD-10-CM | POA: Diagnosis not present

## 2023-07-01 LAB — CBC
HCT: 23.4 % — ABNORMAL LOW (ref 36.0–46.0)
Hemoglobin: 7.1 g/dL — ABNORMAL LOW (ref 12.0–15.0)
MCH: 23.5 pg — ABNORMAL LOW (ref 26.0–34.0)
MCHC: 30.3 g/dL (ref 30.0–36.0)
MCV: 77.5 fL — ABNORMAL LOW (ref 80.0–100.0)
Platelets: 710 10*3/uL — ABNORMAL HIGH (ref 150–400)
RBC: 3.02 MIL/uL — ABNORMAL LOW (ref 3.87–5.11)
RDW: 15 % (ref 11.5–15.5)
WBC: 10.4 10*3/uL (ref 4.0–10.5)
nRBC: 0 % (ref 0.0–0.2)

## 2023-07-01 LAB — COMPREHENSIVE METABOLIC PANEL
ALT: 10 U/L (ref 0–44)
AST: 11 U/L — ABNORMAL LOW (ref 15–41)
Albumin: 3.8 g/dL (ref 3.5–5.0)
Alkaline Phosphatase: 87 U/L (ref 38–126)
Anion gap: 10 (ref 5–15)
BUN: 19 mg/dL (ref 8–23)
CO2: 23 mmol/L (ref 22–32)
Calcium: 9.3 mg/dL (ref 8.9–10.3)
Chloride: 102 mmol/L (ref 98–111)
Creatinine, Ser: 0.75 mg/dL (ref 0.44–1.00)
GFR, Estimated: >60 mL/min (ref >60)
Glucose, Bld: 166 mg/dL — ABNORMAL HIGH (ref 70–99)
Potassium: 3.2 mmol/L — ABNORMAL LOW (ref 3.5–5.1)
Sodium: 135 mmol/L (ref 135–145)
Total Bilirubin: 0.3 mg/dL (ref 0.0–1.2)
Total Protein: 6 g/dL — ABNORMAL LOW (ref 6.5–8.1)

## 2023-07-01 LAB — URINALYSIS, ROUTINE W REFLEX MICROSCOPIC
Bacteria, UA: NONE SEEN
Bilirubin Urine: NEGATIVE
Glucose, UA: NEGATIVE mg/dL
Ketones, ur: NEGATIVE mg/dL
Leukocytes,Ua: NEGATIVE
Nitrite: NEGATIVE
Specific Gravity, Urine: >1.046 — ABNORMAL HIGH (ref 1.005–1.030)
pH: 6 (ref 5.0–8.0)

## 2023-07-01 LAB — PROTIME-INR
INR: 1 (ref 0.8–1.2)
Prothrombin Time: 13.3 s (ref 11.4–15.2)

## 2023-07-01 LAB — HEMOGLOBIN AND HEMATOCRIT, BLOOD
HCT: 20.8 % — ABNORMAL LOW (ref 36.0–46.0)
Hemoglobin: 6.3 g/dL — CL (ref 12.0–15.0)

## 2023-07-01 LAB — LIPASE, BLOOD: Lipase: <10 U/L — ABNORMAL LOW (ref 11–51)

## 2023-07-01 LAB — CEA (ACCESS): CEA (CHCC): 71.3 ng/mL — ABNORMAL HIGH (ref 0.00–5.00)

## 2023-07-01 MED ORDER — SODIUM CHLORIDE 0.9% IV SOLUTION: Freq: Once | INTRAVENOUS | Status: AC

## 2023-07-01 MED ORDER — PANTOPRAZOLE SODIUM 40 MG IV SOLR
40.0000 mg | Freq: Two times a day (BID) | INTRAVENOUS | Status: DC
  Administered 2023-07-02 – 2023-07-07 (×12): 40 mg via INTRAVENOUS
  Filled 2023-07-01 (×12): qty 10

## 2023-07-01 MED ORDER — DEXTROSE IN LACTATED RINGERS 5 % IV SOLN: INTRAVENOUS | Status: AC

## 2023-07-01 MED ORDER — IOPAMIDOL (ISOVUE-300) INJECTION 61%
100.0000 mL | Freq: Once | INTRAVENOUS | Status: AC | PRN
  Administered 2023-07-01: 100 mL via INTRAVENOUS

## 2023-07-01 MED ORDER — HYDROMORPHONE HCL 1 MG/ML IJ SOLN
1.0000 mg | INTRAMUSCULAR | Status: DC | PRN
  Administered 2023-07-02 – 2023-07-06 (×13): 1 mg via INTRAVENOUS
  Filled 2023-07-01 (×14): qty 1

## 2023-07-01 MED ORDER — HYDROMORPHONE HCL 1 MG/ML IJ SOLN
0.5000 mg | Freq: Once | INTRAMUSCULAR | Status: AC
  Administered 2023-07-01: 0.5 mg via INTRAVENOUS
  Filled 2023-07-01: qty 1

## 2023-07-01 MED ORDER — SODIUM CHLORIDE 0.9 % IV BOLUS
1000.0000 mL | Freq: Once | INTRAVENOUS | Status: AC
  Administered 2023-07-01: 1000 mL via INTRAVENOUS

## 2023-07-01 MED ORDER — ONDANSETRON HCL 4 MG/2ML IJ SOLN
4.0000 mg | Freq: Once | INTRAMUSCULAR | Status: AC
  Administered 2023-07-01: 4 mg via INTRAVENOUS
  Filled 2023-07-01: qty 2

## 2023-07-01 MED ORDER — ONDANSETRON HCL 4 MG PO TABS: 4.0000 mg | ORAL_TABLET | Freq: Four times a day (QID) | ORAL | Status: DC | PRN

## 2023-07-01 MED ORDER — HYDROMORPHONE HCL 1 MG/ML IJ SOLN
1.0000 mg | Freq: Once | INTRAMUSCULAR | Status: AC
  Administered 2023-07-01: 1 mg via INTRAVENOUS
  Filled 2023-07-01: qty 1

## 2023-07-01 MED ORDER — SODIUM CHLORIDE 0.9 % IV SOLN: INTRAVENOUS | Status: DC

## 2023-07-01 MED ORDER — ONDANSETRON HCL 4 MG/2ML IJ SOLN
4.0000 mg | Freq: Four times a day (QID) | INTRAMUSCULAR | Status: DC | PRN
  Administered 2023-07-02 – 2023-07-03 (×3): 4 mg via INTRAVENOUS
  Filled 2023-07-01 (×5): qty 2

## 2023-07-01 NOTE — ED Notes (Signed)
Attempted to call report x1, nurse unavailable at this time, will call back when available.

## 2023-07-01 NOTE — Progress Notes (Signed)
Nurse phoned [facility] admissions at [phone]. Nurse was informed that Dr. Mikeal Hawthorne has been made aware of pt's arrival to hospital.  Nurse informed them of pt's room number-1308.

## 2023-07-01 NOTE — ED Notes (Addendum)
-  Called [facility] for ETA on transport and update that patient needs to be next on list due to hemoglobin levels per EDP, stated they have someone in route to pick patient up.

## 2023-07-01 NOTE — ED Provider Notes (Addendum)
[place] EMERGENCY DEPARTMENT AT [facility] Provider Note   CSN: [id number] Arrival date & time: 07/01/23  1618     History  Chief Complaint  Patient presents with   Abdominal Pain    Tina Mccullough is a 73 y.o. female.   Abdominal Pain Patient with some abdominal pain and blood in stool appears about 4 days of initially purple stool and then with some blood.  Has had some right-sided pain.  Had outpatient CT scan done that showed mass and potentially some colitis.  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  Did not have her dose this morning.  Does not feel lightheaded.  Does have a moderate amount of right-sided pain.    Past Medical History:  Diagnosis Date   Anxiety    Depression    DVT of axillary vein, acute left (HCC)    left subclavian vein   Hypertension    PONV (postoperative nausea and vomiting)    Pulmonary embolus (HCC)     Home Medications Prior to Admission medications   Medication Sig Start Date End Date Taking? Authorizing Provider  ascorbic acid (VITAMIN C) 500 MG tablet Take 500 mg by mouth in the morning.    [provider]  Cholecalciferol (VITAMIN D3) 250 MCG (10000 UT) TABS Take 10,000 Units by mouth daily.    [provider]  diphenhydrAMINE (BENADRYL) 25 mg capsule Take 25 mg by mouth daily as needed (ONLY FOR ALLERGIC SYMPTOMS).    [provider]  EPINEPHrine (EPIPEN) 0.3 mg/0.3 mL DEVI Inject 0.3 mLs (0.3 mg total) into the muscle as needed. Patient not taking: Reported on 06/14/2023 07/05/12   Jaci Carrel, PA-C  HYDROcodone-acetaminophen (NORCO/VICODIN) 5-325 MG tablet Take 1-2 tablets by mouth every 4 (four) hours as needed for moderate pain (pain score 4-6). 05/06/23   Alwyn Ren, MD  lidocaine 4 % Place 1 patch onto the skin daily as needed (for pain- remove as directed). Patient not taking: Reported on 06/14/2023    [provider]  Misc Natural Products (SAMBUCUS  ELDERBERRY IMMUNE) CHEW Chew 2 tablets by mouth in the morning.    [provider]  Nicotine (NICODERM CQ TD) Place 1 patch onto the skin daily as needed (for smoking cessation- while hospitalized).    [provider]  olmesartan (BENICAR) 20 MG tablet Take 20 mg by mouth at bedtime.    [provider]  oxyCODONE (OXY IR/ROXICODONE) 5 MG immediate release tablet Take 1 tablet (5 mg total) by mouth 3 (three) times daily as needed for rib fracture pain 05/11/23     rivaroxaban (XARELTO) 20 MG TABS tablet Take 1 tablet (20 mg total) by mouth daily with food 05/11/23         Allergies    Beef-derived drug products, Dog epithelium (canis lupus familiaris), Pork-derived products, Bee venom, Diclofenac, and Lisinopril    Review of Systems   Review of Systems  Gastrointestinal:  Positive for abdominal pain.    Physical Exam Updated Vital Signs BP 139/84   Pulse 87   Temp 98.2 F (36.8 C)   Resp 20   SpO2 98%  Physical Exam Vitals and nursing note reviewed.  Cardiovascular:     Rate and Rhythm: Tachycardia present.  Abdominal:     Tenderness: There is abdominal tenderness.     Hernia: No hernia is present.     Comments: Right-sided tenderness without rebound or guarding.  No hernia palpated.  Neurological:  Mental Status: She is alert.     ED Results / Procedures / Treatments   Labs (all labs ordered are listed, but only abnormal results are displayed) Labs Reviewed  LIPASE, BLOOD - Abnormal; Notable for the following components:      Result Value   Lipase <10 (*)    All other components within normal limits  COMPREHENSIVE METABOLIC PANEL - Abnormal; Notable for the following components:   Potassium 3.2 (*)    Glucose, Bld 166 (*)    Total Protein 6.0 (*)    AST 11 (*)    All other components within normal limits  CBC - Abnormal; Notable for the following components:   RBC 3.02 (*)    Hemoglobin 7.1 (*)    HCT 23.4 (*)    MCV 77.5 (*)    MCH  23.5 (*)    Platelets 710 (*)    All other components within normal limits  URINALYSIS, ROUTINE W REFLEX MICROSCOPIC - Abnormal; Notable for the following components:   Specific Gravity, Urine >1.046 (*)    Hgb urine dipstick SMALL (*)    Protein, ur TRACE (*)    All other components within normal limits  PROTIME-INR  CEA  CANCER ANTIGEN 19-9    EKG None  Radiology CT ABDOMEN PELVIS W CONTRAST Result Date: 07/01/2023 CLINICAL DATA:  Right lower quadrant pain for 3 days. Blood in stool. Constipation. * Tracking Code: BO * EXAM: CT ABDOMEN AND PELVIS WITH CONTRAST TECHNIQUE: Multidetector CT imaging of the abdomen and pelvis was performed using the standard protocol following bolus administration of intravenous contrast. RADIATION DOSE REDUCTION: This exam was performed according to the departmental dose-optimization program which includes automated exposure control, adjustment of the mA and/or kV according to patient size and/or use of iterative reconstruction technique. CONTRAST:  ISOVUE-300 IOPAMIDOL (ISOVUE-300) INJECTION 61% COMPARISON:  06/24/2018 FINDINGS: Lower Chest: No acute findings. Hepatobiliary: No suspicious hepatic masses identified. A few tiny hepatic cysts are noted. Gallbladder is unremarkable. No evidence of biliary ductal dilatation. Pancreas:  No mass or inflammatory changes. Spleen: Within normal limits in size and appearance. Adrenals/Urinary Tract: Stable diffuse nodular thickening of both adrenal glands, consistent with nodular hyperplasia. No suspicious renal masses identified. No evidence of ureteral calculi or hydronephrosis. Stomach/Bowel: The right colon is dilated and contains a large amount of stool. A masslike constricting lesion is seen in the hepatic flexure of the colon measuring approximately 4 cm in length, highly suspicious for obstructing colon carcinoma. Adjacent sub-centimeter pericolonic lymph nodes at this site are seen, suspicious for local lymph  node metastases. Mild right colonic wall thickening and adjacent soft tissue stranding also seen, consistent with colitis. No evidence of pneumatosis, free intraperitoneal air, or abscess. Normal appendix visualized. Vascular/Lymphatic: No pathologically enlarged lymph nodes. No acute vascular findings. Reproductive: No mass or other significant abnormality. Pessary noted in vagina. Other:  None. Musculoskeletal:  No suspicious bone lesions identified. IMPRESSION: Obstructing masslike lesion in hepatic flexure of colon, highly suspicious for colon carcinoma. Dilated ascending colon shows mild wall thickening and adjacent stranding, highly suspicious for colitis. No evidence of perforation or abscess. Adjacent sub-centimeter pericolonic lymph nodes at this site. Local lymph node metastases cannot be excluded. Mild right colonic wall thickening and adjacent soft tissue stranding, consistent with colitis. No evidence of distant metastatic disease. These results will be called to the ordering clinician or representative by the Radiologist Assistant, and communication documented in the PACS or [employer]. Electronically Signed   By: Jonny Ruiz  Geanie Cooley M.D.   On: 07/01/2023 15:18    Procedures Procedures    Medications Ordered in ED Medications  sodium chloride 0.9 % bolus 1,000 mL (1,000 mLs Intravenous New Bag/Given 07/01/23 1802)    And  0.9 %  sodium chloride infusion (has no administration in time range)  HYDROmorphone (DILAUDID) injection 1 mg (has no administration in time range)  HYDROmorphone (DILAUDID) injection 0.5 mg (0.5 mg Intravenous Given 07/01/23 1741)    ED Course/ Medical Decision Making/ A&P                                 Medical Decision Making Amount and/or Complexity of Data Reviewed Labs: ordered.  Risk Prescription drug management. Decision regarding hospitalization.   Patient abdominal pain and GI bleed.  Obstructing mass found on hepatic flexure of colon.   Differential diagnosis does include cancer.  Infection felt less likely.  Does have hemoglobin of 7.  However mild tachycardia but blood pressure maintained.  Will give pain medicine.  Will get basic blood work but admit to hospital.  Discussed with Dr. Janee Morn from general surgery.  From a surgical standpoint can go to either [facility] or [facility].  They can be consulted tomorrow.  Blood pressure and heart rate is improved.  However continued having pain.  States the half milligram of Dilaudid not help.  Will give 1 mg now.    CRITICAL CARE Performed by: Benjiman Core Total critical care time: 45 minutes Critical care time was exclusive of separately billable procedures and treating other patients. Critical care was necessary to treat or prevent imminent or life-threatening deterioration. Critical care was time spent personally by me on the following activities: development of treatment plan with patient and/or surrogate as well as nursing, discussions with consultants, evaluation of patient's response to treatment, examination of patient, obtaining history from patient or surrogate, ordering and performing treatments and interventions, ordering and review of laboratory studies, ordering and review of radiographic studies, pulse oximetry and re-evaluation of patient's condition.  Discussed with hospitalist who will admit.   Patient developed more pain and given another dose of pain medicine.  Recheck of hemoglobin done and is gone from 7 down to 6.  Discussed with charge nurse and we are attempting to get patient transferred more urgently to [facility].  Bed has been assigned but reportedly waiting on transport.  Vitals still appears stable.        Final Clinical Impression(s) / ED Diagnoses Final diagnoses:  Colonic mass  Acute blood loss anemia    Rx / DC Orders ED Discharge Orders     None         Benjiman Core, MD 07/01/23 2100    Benjiman Core,  MD 07/01/23 2148

## 2023-07-01 NOTE — ED Notes (Signed)
Tina Mccullough at [facility] called for transport: 18:51

## 2023-07-01 NOTE — ED Notes (Signed)
Informed both Provider Rubin Payor and RN [facility] about critical hgb.  [place] indicated that pt needed to be transferred now. RN explained that it was not the room availability but rather the line for transport.  NS now calling [facility] to get ETA.

## 2023-07-01 NOTE — H&P (Signed)
History and Physical    Patient: Tina Mccullough ZOX:[id number] DOB: 09-05-1950 DOA: 07/01/2023 DOS: the patient was seen and examined on 07/01/2023 PCP: [name], MD  Patient coming from: Home  Chief Complaint:  Chief Complaint  Patient presents with   Abdominal Pain   HPI: Tina Mccullough is a 73 y.o. female with medical history significant of DVT on Xarelto, anxiety disorder, depression, pulmonary embolism who was seen at [facility] tonight with weakness, abdominal pain and bloody stools about 4 days ago.  Patient has had some right sided pain.  He had an outpatient CT scan that showed mass and potentially some colitis.  As patient is on Xarelto patient was seen with the CT confirming the right hepatic flexure mass.  Patient's hemoglobin was initially 7.1 but has dropped to 6.3.  She appears to have possible colon cancer.  Surgery was consulted Dr. Janee Morn to see patient.  Medicine consulted to admit the patient for further evaluation.  Review of Systems: As mentioned in the history of present illness. All other systems reviewed and are negative. 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 She has a 37 pack-year smoking history. She has never used smokeless tobacco. She reports that she does not drink alcohol and does not use  drugs.  Allergies  Allergen Reactions   Beef-Derived Drug Products Hives   Dog Epithelium (Canis Lupus Familiaris) Anaphylaxis   Pork-Derived Products Hives   Bee Venom Hives   Diclofenac Nausea And Vomiting   Lisinopril Other (See Comments)    Headaches    No family history on file.  Prior to Admission medications   Medication Sig Start Date End Date Taking? Authorizing Provider  ascorbic acid (VITAMIN C) 500 MG tablet Take 500 mg by mouth in the morning.    [provider]  Cholecalciferol (VITAMIN D3) 250 MCG (10000 UT) TABS Take 10,000 Units by mouth daily.    [provider]  diphenhydrAMINE (BENADRYL) 25 mg capsule Take 25 mg by mouth daily as needed (ONLY FOR ALLERGIC SYMPTOMS).    [provider]  EPINEPHrine (EPIPEN) 0.3 mg/0.3 mL DEVI Inject 0.3 mLs (0.3 mg total) into the muscle as needed. Patient not taking: Reported on 06/14/2023 07/05/12   Jaci Carrel, PA-C  HYDROcodone-acetaminophen (NORCO/VICODIN) 5-325 MG tablet Take 1-2 tablets by mouth every 4 (four) hours as needed for moderate pain (pain score 4-6). 05/06/23   Alwyn Ren, MD  lidocaine 4 % Place 1 patch onto the skin daily as needed (for pain- remove as directed). Patient not taking: Reported on 06/14/2023    [provider]  Misc Natural Products (SAMBUCUS ELDERBERRY IMMUNE) CHEW Chew 2 tablets by mouth in the morning.    [provider]  Nicotine (NICODERM CQ TD) Place  1 patch onto the skin daily as needed (for smoking cessation- while hospitalized).    [provider]  olmesartan (BENICAR) 20 MG tablet Take 20 mg by mouth at bedtime.    [provider]  oxyCODONE (OXY IR/ROXICODONE) 5 MG immediate release tablet Take 1 tablet (5 mg total) by mouth 3 (three) times daily as needed for rib fracture pain 05/11/23     rivaroxaban (XARELTO) 20 MG TABS tablet Take 1 tablet (20 mg total) by mouth daily with food 05/11/23       Physical Exam: Vitals:    07/01/23 2057 07/01/23 2100 07/01/23 2145 07/01/23 2241  BP:  134/78 (!) 145/76 (!) 139/91  Pulse:  88 89 93  Resp:  15 17 18   Temp: 98.3 F (36.8 C)   98.1 F (36.7 C)  TempSrc: Oral   Oral  SpO2:  97% 97% 92%   Constitutional: NAD, calm, comfortable Eyes: PERRL, lids and conjunctivae pale ENMT: Mucous membranes are moist. Posterior pharynx clear of any exudate or lesions.Normal dentition.  Neck: normal, supple, no masses, no thyromegaly Respiratory: clear to auscultation bilaterally, no wheezing, no crackles. Normal respiratory effort. No accessory muscle use.  Cardiovascular: Regular rate and rhythm, no murmurs / rubs / gallops. No extremity edema. 2+ pedal pulses. No carotid bruits.  Abdomen: no tenderness, no masses palpated. No hepatosplenomegaly. Bowel sounds positive.  Musculoskeletal: Good range of motion, no joint swelling or tenderness, Skin: no rashes, lesions, ulcers. No induration Neurologic: CN 2-12 grossly intact. Sensation intact, DTR normal. Strength 5/5 in all 4.  Psychiatric: Normal judgment and insight. Alert and oriented x 3. Normal mood  Data Reviewed:  Potassium 3.2 glucose 166 hemoglobin 7.1 now 6.3 platelets 710 urinalysis essentially negative.  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  Patient will be admitted.  Surgery consulted.  Will also consult GI.  Patient will require either biopsy or surgery.  Will defer to GI and surgery to decide.  CEA is elevated at 71.3.  #2 acute blood loss anemia: Secondary to GI bleed.  Will transfuse 2 units of packed red blood cells.  Continue to monitor after that.  #3 lower GI bleed: Secondary to the colon cancer.  We will hold Xarelto.  IV Protonix.  #4 essential hypertension: Will hold antihypertensives in the  setting of GI bleed.  #5 COPD: No acute exacerbation.  Continue to monitor  #6 history of pulmonary embolism: We will hold Xarelto.  Most likely patient will resume after procedure.  #7 Tobacco Abuse: Counseled. Offer Nicotine patch.  #8 Hypokalemia: Will continue to replete.    Advance Care Planning:   Code Status: Full Code   Consults: Surgery, Dr Janee Morn, GI Dr Delton [place]  Family Communication: Son at bedside  Severity of Illness: The appropriate patient status for this patient is INPATIENT. Inpatient status is judged to be reasonable and necessary in order to provide the required intensity of service to ensure the patient's safety. The patient's presenting symptoms, physical exam findings, and initial radiographic and laboratory data in the context of their chronic comorbidities is felt to place them at high risk for further clinical deterioration. Furthermore, it is not anticipated that the patient will be medically stable for discharge from the hospital within 2 midnights of admission.   * I certify that at the point of admission  it is my clinical judgment that the patient will require inpatient hospital care spanning beyond 2 midnights from the point of admission due to high intensity of service, high risk for further deterioration and high frequency of surveillance required.*  AuthorLonia Blood, MD 07/01/2023 11:13 PM  For on call review [email or web address].

## 2023-07-01 NOTE — ED Notes (Signed)
Pt ambulatory to bathroom with steady gait. Pt denies any dizziness or weakness.

## 2023-07-01 NOTE — ED Notes (Signed)
[facility] RN updated about status of patient and HgB of 6.3.

## 2023-07-01 NOTE — ED Triage Notes (Addendum)
Sent for CT showing GI mass and bleeding. HGB 7. Pain on right side abd 3 days. Xarelto- last dose yesterday-blood in stool today.

## 2023-07-02 ENCOUNTER — Encounter (HOSPITAL_COMMUNITY): Payer: Self-pay | Admitting: Internal Medicine

## 2023-07-02 ENCOUNTER — Inpatient Hospital Stay (HOSPITAL_COMMUNITY): Payer: PPO

## 2023-07-02 DIAGNOSIS — K6389 Other specified diseases of intestine: Secondary | ICD-10-CM | POA: Diagnosis not present

## 2023-07-02 LAB — COMPREHENSIVE METABOLIC PANEL
ALT: 13 U/L (ref 0–44)
AST: 15 U/L (ref 15–41)
Albumin: 2.9 g/dL — ABNORMAL LOW (ref 3.5–5.0)
Alkaline Phosphatase: 73 U/L (ref 38–126)
Anion gap: 8 (ref 5–15)
BUN: 20 mg/dL (ref 8–23)
CO2: 22 mmol/L (ref 22–32)
Calcium: 8.9 mg/dL (ref 8.9–10.3)
Chloride: 106 mmol/L (ref 98–111)
Creatinine, Ser: 0.69 mg/dL (ref 0.44–1.00)
GFR, Estimated: >60 mL/min (ref >60)
Glucose, Bld: 110 mg/dL — ABNORMAL HIGH (ref 70–99)
Potassium: 3.3 mmol/L — ABNORMAL LOW (ref 3.5–5.1)
Sodium: 136 mmol/L (ref 135–145)
Total Bilirubin: 0.6 mg/dL (ref 0.0–1.2)
Total Protein: 6 g/dL — ABNORMAL LOW (ref 6.5–8.1)

## 2023-07-02 LAB — CBC
HCT: 21.9 % — ABNORMAL LOW (ref 36.0–46.0)
HCT: 28.1 % — ABNORMAL LOW (ref 36.0–46.0)
Hemoglobin: 6.6 g/dL — CL (ref 12.0–15.0)
Hemoglobin: 8.9 g/dL — ABNORMAL LOW (ref 12.0–15.0)
MCH: 23.9 pg — ABNORMAL LOW (ref 26.0–34.0)
MCH: 25.8 pg — ABNORMAL LOW (ref 26.0–34.0)
MCHC: 30.1 g/dL (ref 30.0–36.0)
MCHC: 31.7 g/dL (ref 30.0–36.0)
MCV: 79.3 fL — ABNORMAL LOW (ref 80.0–100.0)
MCV: 81.4 fL (ref 80.0–100.0)
Platelets: 730 10*3/uL — ABNORMAL HIGH (ref 150–400)
Platelets: 541 10*3/uL — ABNORMAL HIGH (ref 150–400)
RBC: 2.76 MIL/uL — ABNORMAL LOW (ref 3.87–5.11)
RBC: 3.45 MIL/uL — ABNORMAL LOW (ref 3.87–5.11)
RDW: 14.8 % (ref 11.5–15.5)
RDW: 15 % (ref 11.5–15.5)
WBC: 13 10*3/uL — ABNORMAL HIGH (ref 4.0–10.5)
WBC: 12.7 10*3/uL — ABNORMAL HIGH (ref 4.0–10.5)
nRBC: 0 % (ref 0.0–0.2)
nRBC: 0 % (ref 0.0–0.2)

## 2023-07-02 LAB — ABO/RH: ABO/RH(D): A POS

## 2023-07-02 LAB — HEMOGLOBIN AND HEMATOCRIT, BLOOD
HCT: 27.8 % — ABNORMAL LOW (ref 36.0–46.0)
HCT: 29.5 % — ABNORMAL LOW (ref 36.0–46.0)
Hemoglobin: 8.8 g/dL — ABNORMAL LOW (ref 12.0–15.0)
Hemoglobin: 9 g/dL — ABNORMAL LOW (ref 12.0–15.0)

## 2023-07-02 LAB — PREPARE RBC (CROSSMATCH)

## 2023-07-02 MED ORDER — PEG 3350-KCL-NA BICARB-NACL 420 G PO SOLR
4000.0000 mL | Freq: Once | ORAL | Status: AC
  Administered 2023-07-02: 4000 mL via ORAL

## 2023-07-02 MED ORDER — BISACODYL 5 MG PO TBEC
10.0000 mg | DELAYED_RELEASE_TABLET | Freq: Once | ORAL | Status: DC
  Filled 2023-07-02: qty 2

## 2023-07-02 MED ORDER — OXYCODONE-ACETAMINOPHEN 5-325 MG PO TABS
1.0000 | ORAL_TABLET | Freq: Four times a day (QID) | ORAL | Status: DC | PRN
  Administered 2023-07-04 – 2023-07-05 (×3): 1 via ORAL
  Filled 2023-07-02 (×3): qty 1

## 2023-07-02 MED ORDER — ONDANSETRON HCL 4 MG/2ML IJ SOLN
4.0000 mg | Freq: Once | INTRAMUSCULAR | Status: AC
  Administered 2023-07-02: 4 mg via INTRAVENOUS

## 2023-07-02 MED ORDER — FLEET ENEMA RE ENEM
1.0000 | ENEMA | Freq: Once | RECTAL | Status: AC
  Administered 2023-07-02: 1 via RECTAL
  Filled 2023-07-02: qty 1

## 2023-07-02 MED ORDER — IOHEXOL 300 MG/ML  SOLN
75.0000 mL | Freq: Once | INTRAMUSCULAR | Status: AC | PRN
  Administered 2023-07-02: 75 mL via INTRAVENOUS

## 2023-07-02 MED ORDER — CARMEX CLASSIC LIP BALM EX OINT
TOPICAL_OINTMENT | CUTANEOUS | Status: DC | PRN
  Filled 2023-07-02: qty 10

## 2023-07-02 NOTE — Consult Note (Signed)
Referring Provider: TH Primary Care Physician:  [name], MD Primary Gastroenterologist:  Dr. Matthias Hughs  Reason for Consultation: Rectal bleeding, abnormal CT concerning for colon mass  HPI: Tina Mccullough is a 73 y.o. female with past medical history followed with of recently diagnosed DVT and PE in November 2024 was started on Xarelto admitted to the hospital for further management of abdominal pain, rectal bleeding as well as abnormal CT concerning for hepatic flexure mass with possible local lymph node metastasis.  Hemoglobin in November was normal.  Hemoglobin down to 6.6 this morning.  Normal LFTs.  Elevated CEA at 71.30.  Patient started having right-sided abdominal pain around 3 days ago.  Described as sharp abdominal pain followed by episodes of rectal bleeding.  Had some nausea.  Denies any vomiting.  Weight is stable.  Denies any change in appetite.  Last colonoscopy by Dr. Matthias Hughs on July 08, 2018 showed few small polyps in the rectum, diverticulosis and internal hemorrhoids.  Biopsy showed 1 tubular adenoma, few hyperplastic polyps and lymphocytic colitis.  Repeat colonoscopy was recommended in 5 years.   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 Authorizing Provider  ascorbic acid (VITAMIN C) 500 MG tablet Take 1,500 mg by mouth  in the morning.   Yes [provider]  Cholecalciferol (VITAMIN D-3) 125 MCG (5000 UT) TABS Take 10,000 Units by mouth daily.   Yes [provider]  diphenhydrAMINE (BENADRYL) 25 mg capsule Take 25 mg by mouth daily as needed (ONLY FOR ALLERGIC SYMPTOMS).   Yes [provider]  EPINEPHrine (EPIPEN) 0.3 mg/0.3 mL DEVI Inject 0.3 mLs (0.3 mg total) into the muscle as needed. Patient taking differently: Inject 0.3 mg into the muscle as needed (allergic reaction). 07/05/12  Yes Paz, [place], PA-C  Misc Natural Products (SAMBUCUS ELDERBERRY IMMUNE) CHEW Chew 2 tablets by mouth in the morning.   Yes [provider]  Nicotine (NICODERM CQ TD) Place 1 patch onto the skin daily as needed (for smoking cessation- while hospitalized).   Yes [provider]  olmesartan (BENICAR) 20 MG tablet Take 20 mg by mouth at bedtime.   Yes [provider]  oxyCODONE (OXY IR/ROXICODONE) 5 MG immediate release tablet Take 1 tablet (5 mg total) by mouth 3 (three) times daily as needed for rib fracture pain 05/11/23  Yes   rivaroxaban (XARELTO) 20 MG TABS tablet Take 1 tablet (20 mg total) by mouth daily with food 05/11/23  Yes   HYDROcodone-acetaminophen (NORCO/VICODIN) 5-325 MG tablet Take 1-2 tablets by mouth every 4 (four) hours as needed for moderate pain (pain score 4-6). Patient not taking: Reported on 07/02/2023 05/06/23   Alwyn Ren, MD    Scheduled Meds:  pantoprazole (PROTONIX) IV  40 mg Intravenous  Q12H   Continuous Infusions:  sodium chloride 125 mL/hr at 07/01/23 1859   dextrose 5% lactated ringers 40 mL/hr at 07/02/23 0229   PRN Meds:.HYDROmorphone (DILAUDID) injection, ondansetron **OR** ondansetron (ZOFRAN) IV, oxyCODONE-acetaminophen  Allergies as of 07/01/2023 - Review Complete 07/01/2023  Allergen Reaction Noted   Beef-derived drug products Hives 05/05/2023   Dog epithelium (canis lupus familiaris) Anaphylaxis 05/05/2023   Pork-derived  products Hives 05/05/2023   Bee venom Hives 05/05/2023   Diclofenac Nausea And Vomiting 05/05/2023   Lisinopril Other (See Comments) 05/05/2023    No family history on file.  Social History   Socioeconomic History   Marital status: Widowed    Spouse name: Not on file   Number of children: Not on file   Years of education: Not on file   Highest education level: Not on file  Occupational History   Not on file  Tobacco Use   Smoking status: Every Day    Current packs/day: 1.00    Average packs/day: 1 pack/day for 37.0 years (37.0 ttl pk-yrs)    Types: E-cigarettes, Cigarettes   Smokeless tobacco: Never   Tobacco comments:    Counseled on reduce to quit. Information on classes shared  Vaping Use   Vaping status: Never Used  Substance and Sexual Activity   Alcohol use: No   Drug use: No   Sexual activity: Not on file  Other Topics Concern   Not on file  Social History Narrative   Not on file   Social Drivers of Health   Financial Resource Strain: Not on file  Food Insecurity: No Food Insecurity (07/02/2023)   Hunger Vital Sign    Worried About Running Out of Food in the Last Year: Never true    Ran Out of Food in the Last Year: Never true  Transportation Needs: No Transportation Needs (07/02/2023)   PRAPARE - Administrator, Civil Service (Medical): No    Lack of Transportation (Non-Medical): No  Physical Activity: Not on file  Stress: Not on file  Social Connections: Unknown (07/02/2023)   Social Connection and Isolation Panel [NHANES]    Frequency of Communication with Friends and Family: Three times a week    Frequency of Social Gatherings with Friends and Family: Three times a week    Attends Religious Services: Not on file    Active Member of Clubs or Organizations: Not on file    Attends Club or Organization Meetings: Not on file    Marital Status: Not on file  Intimate Partner Violence: Not At Risk (07/02/2023)   Humiliation, Afraid, Rape, and Kick  questionnaire    Fear of Current or Ex-Partner: No    Emotionally Abused: No    Physically Abused: No    Sexually Abused: No    Review of Systems: All negative except as stated above in HPI.  Physical Exam: Vital signs: Vitals:   07/02/23 0606 07/02/23 0629  BP: 128/75 (!) 142/82  Pulse: 91 94  Resp: 18 18  Temp: 98.5 F (36.9 C) 99.1 F (37.3 C)  SpO2: 90% 93%     General:   Alert,  Well-developed, well-nourished, pleasant and cooperative in NAD HEENT -normocephalic, atraumatic, extraocular movement intact Lungs:  Clear throughout to auscultation.   No wheezes, crackles, or rhonchi. No acute distress. Heart:  Regular rate and rhythm; no murmurs, clicks, rubs,  or gallops. Abdomen: Mild right lower quadrant distention with tenderness to palpation, bowel sound present, no peritoneal signs, some voluntary guarding was present.  Alert and oriented x 3 Mood and affect normal Rectal:  Deferred  GI:  Lab Results: Recent Labs    07/01/23 1652 07/01/23 2129 07/02/23 0118  WBC 10.4  --  13.0*  HGB 7.1* 6.3* 6.6*  HCT 23.4* 20.8* 21.9*  PLT 710*  --  730*   BMET Recent Labs    07/01/23 1652  NA 135  K 3.2*  CL 102  CO2 23  GLUCOSE 166*  BUN 19  CREATININE 0.75  CALCIUM 9.3   LFT Recent Labs    07/01/23 1652  PROT 6.0*  ALBUMIN 3.8  AST 11*  ALT 10  ALKPHOS 87  BILITOT 0.3   PT/INR Recent Labs    07/01/23 1743  LABPROT 13.3  INR 1.0     Studies/Results: CT ABDOMEN PELVIS W CONTRAST Result Date: 07/01/2023 CLINICAL DATA:  Right lower quadrant pain for 3 days. Blood in stool. Constipation. * Tracking Code: BO * EXAM: CT ABDOMEN AND PELVIS WITH CONTRAST TECHNIQUE: Multidetector CT imaging of the abdomen and pelvis was performed using the standard protocol following bolus administration of intravenous contrast. RADIATION DOSE REDUCTION: This exam was performed according to the departmental dose-optimization program which includes automated exposure  control, adjustment of the mA and/or kV according to patient size and/or use of iterative reconstruction technique. CONTRAST:  ISOVUE-300 IOPAMIDOL (ISOVUE-300) INJECTION 61% COMPARISON:  06/24/2018 FINDINGS: Lower Chest: No acute findings. Hepatobiliary: No suspicious hepatic masses identified. A few tiny hepatic cysts are noted. Gallbladder is unremarkable. No evidence of biliary ductal dilatation. Pancreas:  No mass or inflammatory changes. Spleen: Within normal limits in size and appearance. Adrenals/Urinary Tract: Stable diffuse nodular thickening of both adrenal glands, consistent with nodular hyperplasia. No suspicious renal masses identified. No evidence of ureteral calculi or hydronephrosis. Stomach/Bowel: The right colon is dilated and contains a large amount of stool. A masslike constricting lesion is seen in the hepatic flexure of the colon measuring approximately 4 cm in length, highly suspicious for obstructing colon carcinoma. Adjacent sub-centimeter pericolonic lymph nodes at this site are seen, suspicious for local lymph node metastases. Mild right colonic wall thickening and adjacent soft tissue stranding also seen, consistent with colitis. No evidence of pneumatosis, free intraperitoneal air, or abscess. Normal appendix visualized. Vascular/Lymphatic: No pathologically enlarged lymph nodes. No acute vascular findings. Reproductive: No mass or other significant abnormality. Pessary noted in vagina. Other:  None. Musculoskeletal:  No suspicious bone lesions identified. IMPRESSION: Obstructing masslike lesion in hepatic flexure of colon, highly suspicious for colon carcinoma. Dilated ascending colon shows mild wall thickening and adjacent stranding, highly suspicious for colitis. No evidence of perforation or abscess. Adjacent sub-centimeter pericolonic lymph nodes at this site. Local lymph node metastases cannot be excluded. Mild right colonic wall thickening and adjacent soft tissue  stranding, consistent with colitis. No evidence of distant metastatic disease. These results will be called to the ordering clinician or representative by the Radiologist Assistant, and communication documented in the PACS or [employer]. Electronically Signed   By: Danae Orleans M.D.   On: 07/01/2023 15:18    Impression/Plan: -Rectal bleeding with abnormal CT scan concerning for hepatic flexure mass with local lymph node metastasis.  Normal LFTs.  No liver mets.  Elevated CEA. -Acute blood loss anemia.  Hemoglobin down to 6.6. -H/of DVT and PE.  Was on Xarelto.  Last dose yesterday.  Recommendations ------------------------- -Recommend transfusion to keep hemoglobin more than 7. -Continue to hold Xarelto. -Plan for colonoscopy tomorrow.  Okay to have clear liquids  diet today.  Keep n.p.o. past midnight.  Risks (bleeding, infection, bowel perforation that could require surgery, sedation-related changes in cardiopulmonary systems), benefits (identification and possible treatment of source of symptoms, exclusion of certain causes of symptoms), and alternatives (watchful waiting, radiographic imaging studies, empiric medical treatment)  were explained to patient/family in detail and patient wishes to proceed.     LOS: 1 day   Kathi Der  MD, FACP 07/02/2023, 9:11 AM  Contact #  [phone]

## 2023-07-02 NOTE — Consult Note (Signed)
Tina Mccullough [birth year]  [id number].    Requesting MD: Dr. Lanae Boast Chief Complaint/Reason for Consult: Hepatic Flexure Mass  HPI: Tina Mccullough is a 73 y.o. female with a history of DVT on Xarelto (last dose 1/22 AM) and hypertension who presented to the ED for abdominal pain.  Patient reports approximately 1 week ago she began having intermittent R sided abdominal pain and bloody stools.  Prior to this she denied any changes in bowel function or abdominal pain.  No fever, nausea, vomiting or unexpected weight loss.  She states she was still able to eat like normal at home and had a normal appetite.  She underwent workup and was found to have a hepatic flexure mass of the colon on CT and a hemoglobin of 6.6.  She was admitted to [facility].  We were asked to see.  She reports history of prior C-section.  No personal or family history of any colon cancer.  Her last colonoscopy was less than 10 years ago, but greater than 5 years ago and she believes reassuring.  She thinks this may have been with [facility] - I cannot see these results.  She has a 37-pack-year history and quit smoking in November 2024.  No alcohol use.  She works with [employer].  She lives at home with her son.  She walks without an assistive device at baseline and can walk up 2 flights of stairs without getting short of breath at baseline.  ROS: ROS As above, see hpi  No family history on file.  Past Medical History:  Diagnosis Date   Anxiety    Depression    DVT of axillary vein, acute left (HCC)    left subclavian vein   Hypertension    PONV (postoperative nausea and vomiting)    Pulmonary embolus (HCC)     Past Surgical History:  Procedure Laterality Date   ARTHROSCOPIC REPAIR ACL Left    CESAREAN SECTION     EXCISION ORAL TUMOR Left 11/28/2014   Procedure: SURGICAL REMOVAL OF ODOTOGENIC  TUMOR AND IMPACTED TOOTH NUMBER 17;  Surgeon: Felton Clinton, DDS;  Location: [facility];   Service: Oral Surgery;  Laterality: Left;   HAND SURGERY Bilateral    for trigger fingers    Social History:  reports that she has been smoking e-cigarettes and cigarettes. She has a 37 pack-year smoking history. She has never used smokeless tobacco. She reports that she does not drink alcohol and does not use drugs.  Allergies:  Allergies  Allergen Reactions   Beef-Derived Drug Products Hives   Dog Epithelium (Canis Lupus Familiaris) Anaphylaxis   Pork-Derived Products Hives   Bee Venom Hives   Diclofenac Nausea And Vomiting   Lisinopril Other (See Comments)    Headaches    Medications Prior to Admission  Medication Sig Dispense Refill   ascorbic acid (VITAMIN C) 500 MG tablet Take 1,500 mg by mouth in the morning.     Cholecalciferol (VITAMIN D-3) 125 MCG (5000 UT) TABS Take 10,000 Units by mouth daily.     diphenhydrAMINE (BENADRYL) 25 mg capsule Take 25 mg by mouth daily as needed (ONLY FOR ALLERGIC SYMPTOMS).     EPINEPHrine (EPIPEN) 0.3 mg/0.3 mL DEVI Inject 0.3 mLs (0.3 mg total) into the muscle as needed. (Patient taking differently: Inject 0.3 mg into the muscle as needed (allergic reaction).) 2 Device 1   Misc Natural Products (SAMBUCUS ELDERBERRY IMMUNE) CHEW Chew 2 tablets by mouth in the  morning.     Nicotine (NICODERM CQ TD) Place 1 patch onto the skin daily as needed (for smoking cessation- while hospitalized).     olmesartan (BENICAR) 20 MG tablet Take 20 mg by mouth at bedtime.     oxyCODONE (OXY IR/ROXICODONE) 5 MG immediate release tablet Take 1 tablet (5 mg total) by mouth 3 (three) times daily as needed for rib fracture pain 20 tablet 0   rivaroxaban (XARELTO) 20 MG TABS tablet Take 1 tablet (20 mg total) by mouth daily with food 30 tablet 11   HYDROcodone-acetaminophen (NORCO/VICODIN) 5-325 MG tablet Take 1-2 tablets by mouth every 4 (four) hours as needed for moderate pain (pain score 4-6). (Patient not taking: Reported on 07/02/2023) 30 tablet 0     Physical  Exam: Blood pressure (!) 142/82, pulse 94, temperature 99.1 F (37.3 C), temperature source Oral, resp. rate 18, SpO2 93%. General: pleasant, WD/WN female who is laying in bed in NAD HEENT: head is normocephalic, atraumatic.   Heart: regular, rate, and rhythm.   Lungs: CTAB, no wheezes, rhonchi, or rales noted.  Respiratory effort nonlabored Abd:  Soft, ND, right sided ttp. +BS.  MS: no BUE or BLE edema Skin: warm and dry  Psych: A&Ox4 with an appropriate affect Neuro: normal speech, thought process intact, moves all extremities, gait not assessed  Results for orders placed or performed during the hospital encounter of 07/01/23 (from the past 48 hours)  Lipase, blood     Status: Abnormal   Collection Time: 07/01/23  4:52 PM  Result Value Ref Range   Lipase <10 (L) 11 - 51 U/L    Comment: Performed at [facility], [address] [place], [place] [address]  Comprehensive metabolic panel     Status: Abnormal   Collection Time: 07/01/23  4:52 PM  Result Value Ref Range   Sodium 135 135 - 145 mmol/L   Potassium 3.2 (L) 3.5 - 5.1 mmol/L   Chloride 102 98 - 111 mmol/L   CO2 23 22 - 32 mmol/L   Glucose, Bld 166 (H) 70 - 99 mg/dL    Comment: Glucose reference range applies only to samples taken after fasting for at least 8 hours.   BUN 19 8 - 23 mg/dL   Creatinine, Ser 2.13 0.44 - 1.00 mg/dL   Calcium 9.3 8.9 - 08.6 mg/dL   Total Protein 6.0 (L) 6.5 - 8.1 g/dL   Albumin 3.8 3.5 - 5.0 g/dL   AST 11 (L) 15 - 41 U/L   ALT 10 0 - 44 U/L   Alkaline Phosphatase 87 38 - 126 U/L   Total Bilirubin 0.3 0.0 - 1.2 mg/dL   GFR, Estimated >57 >84 mL/min    Comment: (NOTE) Calculated using the CKD-EPI Creatinine Equation (2021)    Anion gap 10 5 - 15    Comment: Performed at [facility], [address] [place], [place] [address]  CBC     Status: Abnormal   Collection Time: 07/01/23  4:52 PM  Result Value Ref Range   WBC 10.4 4.0 - 10.5 K/uL   RBC  3.02 (L) 3.87 - 5.11 MIL/uL   Hemoglobin 7.1 (L) 12.0 - 15.0 g/dL    Comment: Reticulocyte Hemoglobin testing may be clinically indicated, consider ordering this additional test [id number]    HCT 23.4 (L) 36.0 - 46.0 %   MCV 77.5 (L) 80.0 - 100.0 fL   MCH 23.5 (L) 26.0 - 34.0 pg   MCHC 30.3 30.0 - 36.0  g/dL   RDW 16.1 09.6 - 04.5 %   Platelets 710 (H) 150 - 400 K/uL   nRBC 0.0 0.0 - 0.2 %    Comment: Performed at [facility], [address] [place], [place] [address]  Urinalysis, Routine w reflex microscopic -Urine, Clean Catch     Status: Abnormal   Collection Time: 07/01/23  5:06 PM  Result Value Ref Range   Color, Urine YELLOW YELLOW   APPearance CLEAR CLEAR   Specific Gravity, Urine >1.046 (H) 1.005 - 1.030   pH 6.0 5.0 - 8.0   Glucose, UA NEGATIVE NEGATIVE mg/dL   Hgb urine dipstick SMALL (A) NEGATIVE   Bilirubin Urine NEGATIVE NEGATIVE   Ketones, ur NEGATIVE NEGATIVE mg/dL   Protein, ur TRACE (A) NEGATIVE mg/dL   Nitrite NEGATIVE NEGATIVE   Leukocytes,Ua NEGATIVE NEGATIVE   RBC / HPF 6-10 0 - 5 RBC/hpf   WBC, UA 0-5 0 - 5 WBC/hpf   Bacteria, UA NONE SEEN NONE SEEN   Squamous Epithelial / HPF 11-20 0 - 5 /HPF   Mucus PRESENT     Comment: Performed at [facility], [address] [place], [place] [address]  Protime-INR     Status: None   Collection Time: 07/01/23  5:43 PM  Result Value Ref Range   Prothrombin Time 13.3 11.4 - 15.2 seconds   INR 1.0 0.8 - 1.2    Comment: (NOTE) INR goal varies based on device and disease states. Performed at [facility], [address], [place], [place] [address]   CEA (Access)     Status: Abnormal   Collection Time: 07/01/23  5:43 PM  Result Value Ref Range   CEA ([facility]) 71.30 (H) 0.00 - 5.00 ng/mL    Comment: (NOTE) This test was performed using Beckman Coulter's paramagnetic chemiluminescent immunoassay. Values obtained from different assay methods cannot be used  interchangeably. Please note that up to 8% of patients who smoke may see values 5.1-10.0 ng/ml and 1% of patients who smoke may see CEA levels >10.0 ng/ml. Performed at [facility], [address] [place], [place] [address]   Hemoglobin and hematocrit, blood     Status: Abnormal   Collection Time: 07/01/23  9:29 PM  Result Value Ref Range   Hemoglobin 6.3 (LL) 12.0 - 15.0 g/dL    Comment: REPEATED TO VERIFY THIS CRITICAL RESULT HAS VERIFIED AND BEEN CALLED TO JENNIFER GOOSTER RN BY KENYATTA BOWMAN ON 01 23 2025 AT 2138, AND HAS BEEN READ BACK.     HCT 20.8 (L) 36.0 - 46.0 %    Comment: Performed at [facility], [address] [place], [place] [address]  Prepare RBC (crossmatch)     Status: None   Collection Time: 07/02/23  1:02 AM  Result Value Ref Range   Order Confirmation      ORDER PROCESSED BY BLOOD BANK Performed at [facility], [address] [place], [place] [address]   Type and screen [facility]     Status: None (Preliminary result)   Collection Time: 07/02/23  1:02 AM  Result Value Ref Range   ABO/RH(D) A POS    Antibody Screen NEG    Sample Expiration 07/05/2023,2359    Unit Number [id number]    Blood Component Type RED CELLS,LR    Unit division 00    Status of Unit ISSUED    Transfusion Status OK TO TRANSFUSE    Crossmatch Result  Compatible Performed at [facility], [address] [place], [place] [address]    Unit Number [id number]    Blood Component Type RED CELLS,LR    Unit division 00    Status of Unit ISSUED    Transfusion Status OK TO TRANSFUSE    Crossmatch Result Compatible   CBC     Status: Abnormal   Collection Time: 07/02/23  1:18 AM  Result Value Ref Range   WBC 13.0 (H) 4.0 - 10.5 K/uL   RBC 2.76 (L) 3.87 - 5.11 MIL/uL   Hemoglobin 6.6 (LL) 12.0 - 15.0 g/dL    Comment: REPEATED TO VERIFY THIS CRITICAL RESULT HAS VERIFIED AND  BEEN CALLED TO N. DURANT, RN BY KATIE DAVIS ON 01 24 2025 AT 0208, AND HAS BEEN READ BACK.  THIS CRITICAL RESULT HAS VERIFIED AND BEEN CALLED TO N. DURANT, RN BY KATIE DAVIS ON 01 24 2025 AT 0210, AND HAS BEEN READ BACK.     HCT 21.9 (L) 36.0 - 46.0 %   MCV 79.3 (L) 80.0 - 100.0 fL   MCH 23.9 (L) 26.0 - 34.0 pg   MCHC 30.1 30.0 - 36.0 g/dL   RDW 78.2 95.6 - 21.3 %   Platelets 730 (H) 150 - 400 K/uL   nRBC 0.0 0.0 - 0.2 %    Comment: Performed at [facility], [address] [place], [place] [address]  ABO/Rh     Status: None   Collection Time: 07/02/23  1:18 AM  Result Value Ref Range   ABO/RH(D)      A POS Performed at [facility], [address], [place], [place] [address]    CT ABDOMEN PELVIS W CONTRAST Result Date: 07/01/2023 CLINICAL DATA:  Right lower quadrant pain for 3 days. Blood in stool. Constipation. * Tracking Code: BO * EXAM: CT ABDOMEN AND PELVIS WITH CONTRAST TECHNIQUE: Multidetector CT imaging of the abdomen and pelvis was performed using the standard protocol following bolus administration of intravenous contrast. RADIATION DOSE REDUCTION: This exam was performed according to the departmental dose-optimization program which includes automated exposure control, adjustment of the mA and/or kV according to patient size and/or use of iterative reconstruction technique. CONTRAST:  ISOVUE-300 IOPAMIDOL (ISOVUE-300) INJECTION 61% COMPARISON:  06/24/2018 FINDINGS: Lower Chest: No acute findings. Hepatobiliary: No suspicious hepatic masses identified. A few tiny hepatic cysts are noted. Gallbladder is unremarkable. No evidence of biliary ductal dilatation. Pancreas:  No mass or inflammatory changes. Spleen: Within normal limits in size and appearance. Adrenals/Urinary Tract: Stable diffuse nodular thickening of both adrenal glands, consistent with nodular hyperplasia. No suspicious renal masses identified. No evidence of ureteral calculi or  hydronephrosis. Stomach/Bowel: The right colon is dilated and contains a large amount of stool. A masslike constricting lesion is seen in the hepatic flexure of the colon measuring approximately 4 cm in length, highly suspicious for obstructing colon carcinoma. Adjacent sub-centimeter pericolonic lymph nodes at this site are seen, suspicious for local lymph node metastases. Mild right colonic wall thickening and adjacent soft tissue stranding also seen, consistent with colitis. No evidence of pneumatosis, free intraperitoneal air, or abscess. Normal appendix visualized. Vascular/Lymphatic: No pathologically enlarged lymph nodes. No acute vascular findings. Reproductive: No mass or other significant abnormality. Pessary noted in vagina. Other:  None. Musculoskeletal:  No suspicious bone lesions identified. IMPRESSION: Obstructing masslike lesion in hepatic flexure of colon, highly suspicious for colon carcinoma. Dilated ascending colon shows mild wall thickening and adjacent stranding, highly suspicious for  colitis. No evidence of perforation or abscess. Adjacent sub-centimeter pericolonic lymph nodes at this site. Local lymph node metastases cannot be excluded. Mild right colonic wall thickening and adjacent soft tissue stranding, consistent with colitis. No evidence of distant metastatic disease. These results will be called to the ordering clinician or representative by the Radiologist Assistant, and communication documented in the PACS or [employer]. Electronically Signed   By: Danae Orleans M.D.   On: 07/01/2023 15:18    Anti-infectives (From admission, onward)    None       Assessment/Plan Bleeding Hepatic flexure mass  This is a 73 year old female who presented with less than 1 week of right-sided abdominal pain and bloody stools.  She is on Xarelto for history of DVT with a last dose on 1/22 AM.  Workup was significant for hemoglobin of 6.6 and obstructing mass in the hepatic flexure of the  colon with pericolonic lymph nodes.  She is currently receiving PRBC. GI eval pending.   - Agree with GI consult for colonoscopy for tissue bx and to eval the remainder of the colon to ensure there are no other areas that would need resection.  - Agree with holding DOAC and giving PRBC. Follow hgb. She reports last episode of bloody BM was on 1/22 - CEA 71.3 - Obtain CT chest to r/o mets - Given this is a bleeding colon mass, she will likely need surgery during admission but would await further workup as above unless she was to acutely decompensate before this was able to be completed - We will follow with you  FEN - NPO for GI eval VTE - SCDs, Xarelto on hold ID - None  I reviewed nursing notes, hospitalist notes, last 24 h vitals and pain scores, last 48 h intake and output, last 24 h labs and trends, and last 24 h imaging results.  Jacinto Halim, [facility] 07/02/2023, 9:35 AM Please see Amion for pager number during day hours 7:00am-4:30pm

## 2023-07-02 NOTE — Progress Notes (Signed)
PROGRESS NOTE Tina Mccullough  ZOX:[id number] DOB: [birth year] DOA: 07/01/2023 PCP: [name], MD  Brief Narrative/Hospital Course: 73 year old female history of lymphocytic colitis, COPD DVT PE hypertension recent admission on 11/28 for fall and DVT presented [facility] with 4 days of purple stool then some blood, abdominal pain on right side and had outpatient CT scan done that showed a mass and potentially some colitis and sent to the ED In the ED hemodynamically stable-initially tachycardic 117, labs with hypokalemia severe anemia hemoglobin 7.1> 6.3 g and admission was requested to unit PRBC was ordered on admission. CT scan abdomen with contrast 1/23: Obstructing masslike lesion in hepatic flexure suspicious for colon carcinoma dilated ascending colon with mild wall thickening suspicious for colitis mild right colonic wall thickening.      Subjective: Seen and examined this morning Overnight afebrile, BP stable Completed  2nd blood transfusion  Last CBC with mild leukocytosis Family members at the bedside  Assessment and Plan: Principal Problem:   Colonic mass Active Problems:   DVT (deep venous thrombosis) (HCC)   Essential hypertension   Tobacco abuse   COPD (chronic obstructive pulmonary disease) (HCC)   Hypokalemia   Acute posthemorrhagic anemia    Right sided Colonic mass Severe anemia-ABLA Rectal bleed likely from malignancy: CT abdomen finding concerning for hepatic flexure colon carcinoma, patient has mild ascending colon-right colonic wall thickening question colitis.  Completied 2 units PRBC today, recheck H&H.  Discussed with GI surgery plan for colonoscopy tomorrow okay for clear liquid diet> getting CT chest to rule out metastasis.  She will likely need surgery during this admission.Holding Xarelto for now continue PPI, pain management oral and IV opiates  Hypokalemia: Replace and monitor  Hypertension: BP stable holding home  meds  History of DVT/PE last 1 in November: Will hold Xarelto for now in the setting of anemia and colonic mass.  COPD: Not in exacerbation  Tobacco abuse: Continue nicotine patch  DVT prophylaxis: SCDs Start: 07/01/23 2312 Code Status:   Code Status: Full Code Family Communication: plan of care discussed with patient/family at bedside. Patient status is: Remains hospitalized because of severity of illness Level of care: Med-Surg   Dispo: The patient is from: From home            Anticipated disposition: TBD Objective: Vitals last 24 hrs: Vitals:   07/02/23 0315 07/02/23 0606 07/02/23 0629 07/02/23 1005  BP: 128/81 128/75 (!) 142/82 129/74  Pulse: 91 91 94 94  Resp: 18 18 18 18   Temp: 98.9 F (37.2 C) 98.5 F (36.9 C) 99.1 F (37.3 C) 99.3 F (37.4 C)  TempSrc: Oral Oral Oral Oral  SpO2: 95% 90% 93% 94%   Weight change:   Physical Examination: General exam: alert awake,at baseline, older than stated age HEENT:Oral mucosa moist, Ear/Nose WNL grossly Respiratory system: Bilaterally clear BS,no use of accessory muscle Cardiovascular system: S1 & S2 +, No JVD. Gastrointestinal system: Abdomen soft,NT,ND, BS+ Nervous System: Alert, awake, moving all extremities,and following commands. Extremities: LE edema neg,distal peripheral pulses palpable and warm.  Skin: No rashes,no icterus. MSK: Normal muscle bulk,tone, power   Medications reviewed:  Scheduled Meds:  bisacodyl  10 mg Oral Once   pantoprazole (PROTONIX) IV  40 mg Intravenous Q12H   polyethylene glycol-electrolytes  4,000 mL Oral Once   sodium phosphate  1 enema Rectal Once   Continuous Infusions:  sodium chloride 125 mL/hr at 07/01/23 1859   dextrose 5% lactated ringers 40 mL/hr at 07/02/23 0229  Diet Order             Diet NPO time specified  Diet effective midnight           Diet clear liquid Room service appropriate? Yes; Fluid consistency: Thin  Diet effective now                    Intake/Output Summary (Last 24 hours) at 07/02/2023 1132 Last data filed at 07/02/2023 1001 Gross per 24 hour  Intake 2976.12 ml  Output 800 ml  Net 2176.12 ml   Net IO Since Admission: 2,176.12 mL [07/02/23 1132]  Wt Readings from Last 3 Encounters:  06/14/23 65.4 kg  05/05/23 61.2 kg  05/02/23 61.2 kg     Unresulted Labs (From admission, onward)     Start     Ordered   07/03/23 0500  Basic metabolic panel  Daily,   R      07/02/23 1132   07/02/23 2000  Hemoglobin and hematocrit, blood  Now then every 8 hours,   R (with TIMED occurrences)      07/02/23 1132   07/02/23 1300  Hemoglobin and hematocrit, blood  Once-Timed,   TIMED        07/02/23 1132   07/01/23 2313  CBC  Now then every 6 hours,   R (with TIMED occurrences),   Status:  Canceled      07/01/23 2313   07/01/23 1715  Cancer antigen 19-9  Once,   URGENT        07/01/23 1714           Data Reviewed: I have personally reviewed following labs and imaging studies CBC: Recent Labs  Lab 07/01/23 1652 07/01/23 2129 07/02/23 0118  WBC 10.4  --  13.0*  HGB 7.1* 6.3* 6.6*  HCT 23.4* 20.8* 21.9*  MCV 77.5*  --  79.3*  PLT 710*  --  730*   Basic Metabolic Panel:  Recent Labs  Lab 07/01/23 1652 07/02/23 1039  NA 135 136  K 3.2* 3.3*  CL 102 106  CO2 23 22  GLUCOSE 166* 110*  BUN 19 20  CREATININE 0.75 0.69  CALCIUM 9.3 8.9   GFR: CrCl cannot be calculated (Unknown ideal weight.). Liver Function Tests:  Recent Labs  Lab 07/01/23 1652 07/02/23 1039  AST 11* 15  ALT 10 13  ALKPHOS 87 73  BILITOT 0.3 0.6  PROT 6.0* 6.0*  ALBUMIN 3.8 2.9*   Recent Labs  Lab 07/01/23 1652  LIPASE <10*   No results for input(s): "AMMONIA" in the last 168 hours. Coagulation Profile:  Recent Labs  Lab 07/01/23 1743  INR 1.0   No results for input(s): "PROBNP" in the last 168 hours.  No results for input(s): "HGBA1C" in the last 72 hours. No results for input(s): "GLUCAP" in the last 168 hours. No results  for input(s): "CHOL", "HDL", "LDLCALC", "TRIG", "CHOLHDL", "LDLDIRECT" in the last 72 hours. No results for input(s): "TSH", "T4TOTAL", "FREET4", "T3FREE", "THYROIDAB" in the last 72 hours. Sepsis Labs: No results for input(s): "PROCALCITON", "LATICACIDVEN" in the last 168 hours. No results found for this or any previous visit (from the past 240 hours).  Antimicrobials/Microbiology: Anti-infectives (From admission, onward)    None      No results found for: "SDES", "SPECREQUEST", "CULT", "REPTSTATUS"   Radiology Studies: CT ABDOMEN PELVIS W CONTRAST Result Date: 07/01/2023 CLINICAL DATA:  Right lower quadrant pain for 3 days. Blood in stool. Constipation. * Tracking Code: BO *  EXAM: CT ABDOMEN AND PELVIS WITH CONTRAST TECHNIQUE: Multidetector CT imaging of the abdomen and pelvis was performed using the standard protocol following bolus administration of intravenous contrast. RADIATION DOSE REDUCTION: This exam was performed according to the departmental dose-optimization program which includes automated exposure control, adjustment of the mA and/or kV according to patient size and/or use of iterative reconstruction technique. CONTRAST:  ISOVUE-300 IOPAMIDOL (ISOVUE-300) INJECTION 61% COMPARISON:  06/24/2018 FINDINGS: Lower Chest: No acute findings. Hepatobiliary: No suspicious hepatic masses identified. A few tiny hepatic cysts are noted. Gallbladder is unremarkable. No evidence of biliary ductal dilatation. Pancreas:  No mass or inflammatory changes. Spleen: Within normal limits in size and appearance. Adrenals/Urinary Tract: Stable diffuse nodular thickening of both adrenal glands, consistent with nodular hyperplasia. No suspicious renal masses identified. No evidence of ureteral calculi or hydronephrosis. Stomach/Bowel: The right colon is dilated and contains a large amount of stool. A masslike constricting lesion is seen in the hepatic flexure of the colon measuring approximately 4 cm in  length, highly suspicious for obstructing colon carcinoma. Adjacent sub-centimeter pericolonic lymph nodes at this site are seen, suspicious for local lymph node metastases. Mild right colonic wall thickening and adjacent soft tissue stranding also seen, consistent with colitis. No evidence of pneumatosis, free intraperitoneal air, or abscess. Normal appendix visualized. Vascular/Lymphatic: No pathologically enlarged lymph nodes. No acute vascular findings. Reproductive: No mass or other significant abnormality. Pessary noted in vagina. Other:  None. Musculoskeletal:  No suspicious bone lesions identified. IMPRESSION: Obstructing masslike lesion in hepatic flexure of colon, highly suspicious for colon carcinoma. Dilated ascending colon shows mild wall thickening and adjacent stranding, highly suspicious for colitis. No evidence of perforation or abscess. Adjacent sub-centimeter pericolonic lymph nodes at this site. Local lymph node metastases cannot be excluded. Mild right colonic wall thickening and adjacent soft tissue stranding, consistent with colitis. No evidence of distant metastatic disease. These results will be called to the ordering clinician or representative by the Radiologist Assistant, and communication documented in the PACS or [employer]. Electronically Signed   By: Danae Orleans M.D.   On: 07/01/2023 15:18     LOS: 1 day   Total time spent in review of labs and imaging, patient evaluation, formulation of plan, documentation and communication with family: 50 minutes  Lanae Boast, MD  [facility]  07/02/2023, 11:32 AM

## 2023-07-02 NOTE — Anesthesia Preprocedure Evaluation (Signed)
Anesthesia Evaluation    Reviewed: Allergy & Precautions, Patient's Chart, lab work & pertinent test results  History of Anesthesia Complications (+) PONV and history of anesthetic complications  Airway        Dental   Pulmonary COPD,  COPD inhaler, Current Smoker, PE 37 pack year history          Cardiovascular hypertension,      Neuro/Psych  PSYCHIATRIC DISORDERS Anxiety Depression    negative neurological ROS     GI/Hepatic Neg liver ROS,,,Rectal bleeding, colon mass   Endo/Other  negative endocrine ROS    Renal/GU negative Renal ROS  negative genitourinary   Musculoskeletal negative musculoskeletal ROS (+)    Abdominal   Peds  Hematology  (+) Blood dyscrasia, anemia Hb 8.8, plt 541   Anesthesia Other Findings   Reproductive/Obstetrics negative OB ROS                             Anesthesia Physical Anesthesia Plan  ASA: 3  Anesthesia Plan: MAC   Post-op Pain Management:    Induction:   PONV Risk Score and Plan: 2 and Propofol infusion and TIVA  Airway Management Planned: Natural Airway and Simple Face Mask  Additional Equipment: None  Intra-op Plan:   Post-operative Plan:   Informed Consent:   Plan Discussed with:   Anesthesia Plan Comments:        Anesthesia Quick Evaluation

## 2023-07-02 NOTE — Hospital Course (Addendum)
73 year old female history of lymphocytic colitis, COPD DVT PE hypertension recent admission on 11/28 for fall and DVT presented to the [facility] with 4 days of purple stool then some blood, abdominal pain on right side and had outpatient CT scan done that showed a mass and potentially some colitis and sent to the ED In the ED hemodynamically stable-initially tachycardic 117, labs with hypokalemia severe anemia hemoglobin 7.1> 6.3 g and admission was requested to unit PRBC was ordered on admission. CT scan abdomen with contrast 1/23: Obstructing masslike lesion in hepatic flexure suspicious for colon carcinoma dilated ascending colon with mild wall thickening suspicious for colitis mild right colonic wall thickening. Patient had difficulty tolerating colon prep underwent colonoscopy 07/04/23 with poor prep showed large obstructing and infiltrative mass in the proximal transverse colon/hepatic flexure but able to advance the scope beyond this area biopsies taken. Biopsy showed adenocarcinoma.  1/28 underwent right hemicolectomy> path showed :Invasive moderately differentiated adenocarcinoma extending through submucosa, muscularis propria and into subserosal adipose tissue, see note.Margins negative.Unremarkable appendix.

## 2023-07-03 ENCOUNTER — Encounter (HOSPITAL_COMMUNITY)
Admission: EM | Disposition: A | Discharge status: Home Health | Payer: Self-pay | Source: Home / Self Care | Attending: Internal Medicine

## 2023-07-03 ENCOUNTER — Encounter (HOSPITAL_COMMUNITY): Payer: Self-pay | Admitting: Certified Registered"

## 2023-07-03 DIAGNOSIS — K6389 Other specified diseases of intestine: Secondary | ICD-10-CM | POA: Diagnosis not present

## 2023-07-03 LAB — BASIC METABOLIC PANEL
Anion gap: 8 (ref 5–15)
BUN: 17 mg/dL (ref 8–23)
CO2: 25 mmol/L (ref 22–32)
Calcium: 8.5 mg/dL — ABNORMAL LOW (ref 8.9–10.3)
Chloride: 104 mmol/L (ref 98–111)
Creatinine, Ser: 0.7 mg/dL (ref 0.44–1.00)
GFR, Estimated: >60 mL/min (ref >60)
Glucose, Bld: 87 mg/dL (ref 70–99)
Potassium: 3.9 mmol/L (ref 3.5–5.1)
Sodium: 137 mmol/L (ref 135–145)

## 2023-07-03 LAB — CANCER ANTIGEN 19-9: CA 19-9: 111 U/mL — ABNORMAL HIGH (ref 0–35)

## 2023-07-03 LAB — HEMOGLOBIN AND HEMATOCRIT, BLOOD
HCT: 25.6 % — ABNORMAL LOW (ref 36.0–46.0)
HCT: 29.1 % — ABNORMAL LOW (ref 36.0–46.0)
HCT: 29.8 % — ABNORMAL LOW (ref 36.0–46.0)
Hemoglobin: 8 g/dL — ABNORMAL LOW (ref 12.0–15.0)
Hemoglobin: 8.9 g/dL — ABNORMAL LOW (ref 12.0–15.0)
Hemoglobin: 9.1 g/dL — ABNORMAL LOW (ref 12.0–15.0)

## 2023-07-03 SURGERY — COLONOSCOPY WITH PROPOFOL
Anesthesia: Monitor Anesthesia Care

## 2023-07-03 MED ORDER — NICOTINE 14 MG/24HR TD PT24
14.0000 mg | MEDICATED_PATCH | Freq: Every day | TRANSDERMAL | Status: DC | PRN
  Administered 2023-07-03 – 2023-07-17 (×6): 14 mg via TRANSDERMAL
  Filled 2023-07-03 (×6): qty 1

## 2023-07-03 MED ORDER — DEXTROSE IN LACTATED RINGERS 5 % IV SOLN: INTRAVENOUS | Status: AC

## 2023-07-03 MED ORDER — PEG-KCL-NACL-NASULF-NA ASC-C 100 G PO SOLR
1.0000 | Freq: Once | ORAL | Status: AC
  Administered 2023-07-03: 200 g via ORAL
  Filled 2023-07-03: qty 1

## 2023-07-03 MED ORDER — ONDANSETRON HCL 4 MG PO TABS
4.0000 mg | ORAL_TABLET | Freq: Four times a day (QID) | ORAL | Status: DC | PRN
  Administered 2023-07-12 – 2023-07-15 (×3): 4 mg via ORAL
  Filled 2023-07-03 (×3): qty 1

## 2023-07-03 MED ORDER — FLEET ENEMA RE ENEM
1.0000 | ENEMA | Freq: Once | RECTAL | Status: AC
  Administered 2023-07-03: 1 via RECTAL
  Filled 2023-07-03: qty 1

## 2023-07-03 MED ORDER — ONDANSETRON HCL 4 MG/2ML IJ SOLN
4.0000 mg | INTRAMUSCULAR | Status: DC | PRN
  Administered 2023-07-03 – 2023-07-13 (×14): 4 mg via INTRAVENOUS
  Filled 2023-07-03 (×15): qty 2

## 2023-07-03 NOTE — Progress Notes (Signed)
[facility] Gastroenterology Progress Note  Tina Mccullough 73 y.o. Oct 11, 1950  CC: Abdominal pain, colon mass   Subjective: Patient seen and examined at bedside.  Colonoscopy was canceled today as she could not drink the prep because of ongoing nausea and vomiting.  Few bowel movements today after drinking some prep with improvement in abdominal distention and abdominal pain.  ROS : Afebrile, negative for chest pain   Objective: Vital signs in last 24 hours: Vitals:   07/03/23 0559 07/03/23 1001  BP: (!) 141/84 (!) 145/89  Pulse: 85 85  Resp: 18 18  Temp: 98.6 F (37 C) 98.8 F (37.1 C)  SpO2: 93% 92%    Physical Exam: Sitting comfortably, not in acute distress.  Somewhat emotional and tearful.  Abdomen is less distended compared to yesterday.  Minimal discomfort on right upper quadrant and right lower quadrant, bowel sound present, no peritoneal signs.  Lab Results: Recent Labs    07/02/23 1039 07/03/23 0525  NA 136 137  K 3.3* 3.9  CL 106 104  CO2 22 25  GLUCOSE 110* 87  BUN 20 17  CREATININE 0.69 0.70  CALCIUM 8.9 8.5*   Recent Labs    07/01/23 1652 07/02/23 1039  AST 11* 15  ALT 10 13  ALKPHOS 87 73  BILITOT 0.3 0.6  PROT 6.0* 6.0*  ALBUMIN 3.8 2.9*   Recent Labs    07/02/23 0118 07/02/23 1039 07/02/23 1211 07/02/23 2049 07/03/23 0525  WBC 13.0* 12.7*  --   --   --   HGB 6.6* 8.9*   < > 9.0* 8.0*  HCT 21.9* 28.1*   < > 29.5* 25.6*  MCV 79.3* 81.4  --   --   --   PLT 730* 541*  --   --   --    < > = values in this interval not displayed.   Recent Labs    07/01/23 1743  LABPROT 13.3  INR 1.0      Assessment/Plan: -Rectal bleeding with abnormal CT scan concerning for hepatic flexure mass with local lymph node metastasis.  Normal LFTs.  No liver mets.  Elevated CEA. -Acute blood loss anemia.  Hemoglobin down to 6.6. -H/of DVT and PE.  Was on Xarelto.  Last dose yesterday.   Recommendations ------------------------- -Long discussion  with patient today regarding importance of completing colonoscopy prep.  Unprepped colonoscopy with limited visualization of possible satellite lesions.  Also, complications such as perforation during unprepped colonoscopy leads to long-term morbidity.  -ok to have Zofran every 4 hour.  She will drink prep slowly as tolerated.  Discussed with RN. -1 more fleets enema today. -Plan for colonoscopy tomorrow.   Kathi Der MD, FACP 07/03/2023, 11:59 AM  Contact #  [phone]

## 2023-07-03 NOTE — Plan of Care (Signed)

## 2023-07-03 NOTE — Progress Notes (Signed)
PROGRESS NOTE Tina Mccullough  ZOX:[id number] DOB: 1950/07/22 DOA: 07/01/2023 PCP: [name], MD  Brief Narrative/Hospital Course: 73 year old female history of lymphocytic colitis, COPD DVT PE hypertension recent admission on 11/28 for fall and DVT presented to the [facility] with 4 days of purple stool then some blood, abdominal pain on right side and had outpatient CT scan done that showed a mass and potentially some colitis and sent to the ED In the ED hemodynamically stable-initially tachycardic 117, labs with hypokalemia severe anemia hemoglobin 7.1> 6.3 g and admission was requested to unit PRBC was ordered on admission. CT scan abdomen with contrast 1/23: Obstructing masslike lesion in hepatic flexure suspicious for colon carcinoma dilated ascending colon with mild wall thickening suspicious for colitis mild right colonic wall thickening.    Subjective: Patient seen examined this morning brother at the bedside Unable to tolerate prep overnight and colonoscopy canceled at this time  Overnight afebrile vital stable repeat labs with hemoglobin 8.8>9>8 g Still having brown bowel movement along with blood  Assessment and Plan: Principal Problem:   Colonic mass Active Problems:   DVT (deep venous thrombosis) (HCC)   Essential hypertension   Tobacco abuse   COPD (chronic obstructive pulmonary disease) (HCC)   Hypokalemia   Acute posthemorrhagic anemia   Right sided Colonic mass Severe anemia-ABLA Rectal bleed likely from malignancy: CT abdomen finding concerning for hepatic flexure colon carcinoma, patient has mild ascending colon-right colonic wall thickening question colitis.  Completied 2 units PRBC. Plan was for colonoscopy but unable to tolerate Prep. CEA is high 71-highly suspicion for malignancy, GI and surgery following. Continue to monitor H&H, IV fluids pain control antiemetics and continue plan of care per surgery likely OR next week Recent Labs  Lab  07/02/23 0118 07/02/23 1039 07/02/23 1211 07/02/23 2049 07/03/23 0525  HGB 6.6* 8.9* 8.8* 9.0* 8.0*  HCT 21.9* 28.1* 27.8* 29.5* 25.6*    Hypokalemia: Resolved  Hypertension: BP stable, continue as needed meds   History of DVT/PE last 1 in November: Will hold Xarelto for now in the setting of anemia and colonic mass with ongoing rectal bleeding.  COPD: Not in exacerbation  Tobacco abuse: Continue nicotine patch  DVT prophylaxis: SCDs Start: 07/01/23 2312 Code Status:   Code Status: Full Code Family Communication: plan of care discussed with patient/family at bedside. Patient status is: Remains hospitalized because of severity of illness Level of care: Med-Surg   Dispo: The patient is from: From home            Anticipated disposition: TBD Objective: Vitals last 24 hrs: Vitals:   07/02/23 1410 07/02/23 2137 07/03/23 0146 07/03/23 0559  BP: 133/72 126/78 137/84 (!) 141/84  Pulse: 89 92 91 85  Resp: 18 18 18 18   Temp: 98.7 F (37.1 C) 98.6 F (37 C) 98.4 F (36.9 C) 98.6 F (37 C)  TempSrc: Oral Oral Oral Oral  SpO2: 91% 97% 95% 93%  Weight:      Height:       Weight change:   Physical Examination: General exam: alert awake, oriented at baseline, older than stated age HEENT:Oral mucosa moist, Ear/Nose WNL grossly Respiratory system: Bilaterally clear BS,no use of accessory muscle Cardiovascular system: S1 & S2 +, No JVD. Gastrointestinal system: Abdomen soft, tender right abdomen ,ND, BS+ Nervous System: Alert, awake, moving all extremities,and following commands. Extremities: LE edema neg,distal peripheral pulses palpable and warm.  Skin: No rashes,no icterus. MSK: Normal muscle bulk,tone, power   Medications reviewed:  Scheduled Meds:  pantoprazole (PROTONIX) IV  40 mg Intravenous Q12H   Continuous Infusions:    Diet Order             Diet clear liquid Room service appropriate? Yes; Fluid consistency: Thin  Diet effective now                    Intake/Output Summary (Last 24 hours) at 07/03/2023 0804 Last data filed at 07/03/2023 0700 Gross per 24 hour  Intake 2240.02 ml  Output 1150 ml  Net 1090.02 ml   Net IO Since Admission: 2,824.47 mL [07/03/23 0804]  Wt Readings from Last 3 Encounters:  07/02/23 65.4 kg  06/14/23 65.4 kg  05/05/23 61.2 kg     Unresulted Labs (From admission, onward)     Start     Ordered   07/03/23 0500  Basic metabolic panel  Daily,   R      07/02/23 1132   07/02/23 2000  Hemoglobin and hematocrit, blood  Now then every 8 hours,   R (with TIMED occurrences)      07/02/23 1132   07/01/23 1715  Cancer antigen 19-9  Once,   URGENT        07/01/23 1714           Data Reviewed: I have personally reviewed following labs and imaging studies CBC: Recent Labs  Lab 07/01/23 1652 07/01/23 2129 07/02/23 0118 07/02/23 1039 07/02/23 1211 07/02/23 2049 07/03/23 0525  WBC 10.4  --  13.0* 12.7*  --   --   --   HGB 7.1*   < > 6.6* 8.9* 8.8* 9.0* 8.0*  HCT 23.4*   < > 21.9* 28.1* 27.8* 29.5* 25.6*  MCV 77.5*  --  79.3* 81.4  --   --   --   PLT 710*  --  730* 541*  --   --   --    < > = values in this interval not displayed.   Basic Metabolic Panel:  Recent Labs  Lab 07/01/23 1652 07/02/23 1039  NA 135 136  K 3.2* 3.3*  CL 102 106  CO2 23 22  GLUCOSE 166* 110*  BUN 19 20  CREATININE 0.75 0.69  CALCIUM 9.3 8.9   GFR: Estimated Creatinine Clearance: 58.9 mL/min (by C-G formula based on SCr of 0.69 mg/dL). Liver Function Tests:  Recent Labs  Lab 07/01/23 1652 07/02/23 1039  AST 11* 15  ALT 10 13  ALKPHOS 87 73  BILITOT 0.3 0.6  PROT 6.0* 6.0*  ALBUMIN 3.8 2.9*   Recent Labs  Lab 07/01/23 1652  LIPASE <10*   No results for input(s): "AMMONIA" in the last 168 hours. Coagulation Profile:  Recent Labs  Lab 07/01/23 1743  INR 1.0   No results for input(s): "PROBNP" in the last 168 hours.  No results for input(s): "HGBA1C" in the last 72 hours. No results for input(s):  "GLUCAP" in the last 168 hours. No results for input(s): "CHOL", "HDL", "LDLCALC", "TRIG", "CHOLHDL", "LDLDIRECT" in the last 72 hours. No results for input(s): "TSH", "T4TOTAL", "FREET4", "T3FREE", "THYROIDAB" in the last 72 hours. Sepsis Labs: No results for input(s): "PROCALCITON", "LATICACIDVEN" in the last 168 hours. No results found for this or any previous visit (from the past 240 hours).  Antimicrobials/Microbiology: Anti-infectives (From admission, onward)    None      No results found for: "SDES", "SPECREQUEST", "CULT", "REPTSTATUS"   Radiology Studies: CT CHEST W CONTRAST Result Date: 07/02/2023 CLINICAL DATA:  Colon cancer staging.  EXAM: CT CHEST WITH CONTRAST TECHNIQUE: Multidetector CT imaging of the chest was performed during intravenous contrast administration. RADIATION DOSE REDUCTION: This exam was performed according to the departmental dose-optimization program which includes automated exposure control, adjustment of the mA and/or kV according to patient size and/or use of iterative reconstruction technique. CONTRAST:  75mL OMNIPAQUE IOHEXOL 300 MG/ML  SOLN COMPARISON:  CT abdomen and pelvis 06/30/2022 FINDINGS: Cardiovascular: No significant vascular findings. Normal heart size. No pericardial effusion. Mediastinum/Nodes: Hypodense right thyroid nodule measures 11 mm. There are no enlarged mediastinal or hilar lymph nodes identified. Visualized esophagus is within normal limits. Lungs/Pleura: There is patchy airspace consolidation in the bilateral lower lobes, right greater than left. Mild emphysematous changes are present. There is no pleural effusion or pneumothorax. Upper Abdomen: There are 2 rounded hypodensities in the liver favored as cysts, unchanged. Nodular thickening of the adrenal glands again noted. Musculoskeletal: There are healed posterior left rib fractures. No acute fractures are seen. No focal osseous lesions are identified. IMPRESSION: 1. No evidence for  metastatic disease in the chest. 2. Patchy airspace consolidation in the bilateral lower lobes, right greater than left, worrisome for pneumonia. 3. Emphysema. Emphysema (ICD10-J43.9). Electronically Signed   By: Darliss Cheney M.D.   On: 07/02/2023 19:19   CT ABDOMEN PELVIS W CONTRAST Result Date: 07/01/2023 CLINICAL DATA:  Right lower quadrant pain for 3 days. Blood in stool. Constipation. * Tracking Code: BO * EXAM: CT ABDOMEN AND PELVIS WITH CONTRAST TECHNIQUE: Multidetector CT imaging of the abdomen and pelvis was performed using the standard protocol following bolus administration of intravenous contrast. RADIATION DOSE REDUCTION: This exam was performed according to the departmental dose-optimization program which includes automated exposure control, adjustment of the mA and/or kV according to patient size and/or use of iterative reconstruction technique. CONTRAST:  ISOVUE-300 IOPAMIDOL (ISOVUE-300) INJECTION 61% COMPARISON:  06/24/2018 FINDINGS: Lower Chest: No acute findings. Hepatobiliary: No suspicious hepatic masses identified. A few tiny hepatic cysts are noted. Gallbladder is unremarkable. No evidence of biliary ductal dilatation. Pancreas:  No mass or inflammatory changes. Spleen: Within normal limits in size and appearance. Adrenals/Urinary Tract: Stable diffuse nodular thickening of both adrenal glands, consistent with nodular hyperplasia. No suspicious renal masses identified. No evidence of ureteral calculi or hydronephrosis. Stomach/Bowel: The right colon is dilated and contains a large amount of stool. A masslike constricting lesion is seen in the hepatic flexure of the colon measuring approximately 4 cm in length, highly suspicious for obstructing colon carcinoma. Adjacent sub-centimeter pericolonic lymph nodes at this site are seen, suspicious for local lymph node metastases. Mild right colonic wall thickening and adjacent soft tissue stranding also seen, consistent with colitis. No  evidence of pneumatosis, free intraperitoneal air, or abscess. Normal appendix visualized. Vascular/Lymphatic: No pathologically enlarged lymph nodes. No acute vascular findings. Reproductive: No mass or other significant abnormality. Pessary noted in vagina. Other:  None. Musculoskeletal:  No suspicious bone lesions identified. IMPRESSION: Obstructing masslike lesion in hepatic flexure of colon, highly suspicious for colon carcinoma. Dilated ascending colon shows mild wall thickening and adjacent stranding, highly suspicious for colitis. No evidence of perforation or abscess. Adjacent sub-centimeter pericolonic lymph nodes at this site. Local lymph node metastases cannot be excluded. Mild right colonic wall thickening and adjacent soft tissue stranding, consistent with colitis. No evidence of distant metastatic disease. These results will be called to the ordering clinician or representative by the Radiologist Assistant, and communication documented in the PACS or [employer]. Electronically Signed   By: Mayra Neer  Eppie Gibson M.D.   On: 07/01/2023 15:18     LOS: 2 days   Total time spent in review of labs and imaging, patient evaluation, formulation of plan, documentation and communication with family: 35 minutes  Lanae Boast, MD  [facility]  07/03/2023, 8:04 AM

## 2023-07-03 NOTE — Progress Notes (Signed)
Colonic mass  Subjective: Able to get some bowel prep down but then was unable to drink the rest due to nausea, did have some BM's with this  Objective: Vital signs in last 24 hours: Temp:  [98.4 F (36.9 C)-99.3 F (37.4 C)] 98.6 F (37 C) (01/25 0559) Pulse Rate:  [85-94] 85 (01/25 0559) Resp:  [18] 18 (01/25 0559) BP: (126-141)/(72-84) 141/84 (01/25 0559) SpO2:  [91 %-97 %] 93 % (01/25 0559) Weight:  [65.4 kg] 65.4 kg (01/24 1100) Last BM Date : 07/01/23  Intake/Output from previous day: 01/24 0701 - 01/25 0700 In: 2240 [P.O.:1080; I.V.:718.4; Blood:441.7] Out: 1150 [Urine:1000; Emesis/NG output:150] Intake/Output this shift: No intake/output data recorded.  General appearance: alert and cooperative GI: soft, non-distended  Lab Results:  Results for orders placed or performed during the hospital encounter of 07/01/23 (from the past 24 hours)  CBC     Status: Abnormal   Collection Time: 07/02/23 10:39 AM  Result Value Ref Range   WBC 12.7 (H) 4.0 - 10.5 K/uL   RBC 3.45 (L) 3.87 - 5.11 MIL/uL   Hemoglobin 8.9 (L) 12.0 - 15.0 g/dL   HCT 29.5 (L) 28.4 - 13.2 %   MCV 81.4 80.0 - 100.0 fL   MCH 25.8 (L) 26.0 - 34.0 pg   MCHC 31.7 30.0 - 36.0 g/dL   RDW 44.0 10.2 - 72.5 %   Platelets 541 (H) 150 - 400 K/uL   nRBC 0.0 0.0 - 0.2 %  Comprehensive metabolic panel     Status: Abnormal   Collection Time: 07/02/23 10:39 AM  Result Value Ref Range   Sodium 136 135 - 145 mmol/L   Potassium 3.3 (L) 3.5 - 5.1 mmol/L   Chloride 106 98 - 111 mmol/L   CO2 22 22 - 32 mmol/L   Glucose, Bld 110 (H) 70 - 99 mg/dL   BUN 20 8 - 23 mg/dL   Creatinine, Ser 3.66 0.44 - 1.00 mg/dL   Calcium 8.9 8.9 - 44.0 mg/dL   Total Protein 6.0 (L) 6.5 - 8.1 g/dL   Albumin 2.9 (L) 3.5 - 5.0 g/dL   AST 15 15 - 41 U/L   ALT 13 0 - 44 U/L   Alkaline Phosphatase 73 38 - 126 U/L   Total Bilirubin 0.6 0.0 - 1.2 mg/dL   GFR, Estimated >34 >74 mL/min   Anion gap 8 5 - 15  Hemoglobin and hematocrit, blood      Status: Abnormal   Collection Time: 07/02/23 12:11 PM  Result Value Ref Range   Hemoglobin 8.8 (L) 12.0 - 15.0 g/dL   HCT 25.9 (L) 56.3 - 87.5 %  Hemoglobin and hematocrit, blood     Status: Abnormal   Collection Time: 07/02/23  8:49 PM  Result Value Ref Range   Hemoglobin 9.0 (L) 12.0 - 15.0 g/dL   HCT 64.3 (L) 32.9 - 51.8 %  Hemoglobin and hematocrit, blood     Status: Abnormal   Collection Time: 07/03/23  5:25 AM  Result Value Ref Range   Hemoglobin 8.0 (L) 12.0 - 15.0 g/dL   HCT 84.1 (L) 66.0 - 63.0 %  Basic metabolic panel     Status: Abnormal   Collection Time: 07/03/23  5:25 AM  Result Value Ref Range   Sodium 137 135 - 145 mmol/L   Potassium 3.9 3.5 - 5.1 mmol/L   Chloride 104 98 - 111 mmol/L   CO2 25 22 - 32 mmol/L   Glucose, Bld 87 70 -  99 mg/dL   BUN 17 8 - 23 mg/dL   Creatinine, Ser 1.61 0.44 - 1.00 mg/dL   Calcium 8.5 (L) 8.9 - 10.3 mg/dL   GFR, Estimated >09 >60 mL/min   Anion gap 8 5 - 15     Studies/Results Radiology     MEDS, Scheduled  pantoprazole (PROTONIX) IV  40 mg Intravenous Q12H     Assessment: Colonic mass Unable to tolerate bowel prep  Plan: Will defer to GI on whether they would like to try a slow bowel prep.  With a CEA of 73, I think her diagnosis is rather clear.  Pt adamant that she does not want an ostomy Plan for OR next week   LOS: 2 days    Vanita Panda, MD [facility]  Moderate decision making   07/03/2023 9:00 AM

## 2023-07-03 NOTE — Progress Notes (Signed)
Pt reported not being able to tolerate the oral prep- reports feeling full and it is making her nauseous. Placed call to gastroenterology after hour service. Dr. Levora Angel returned call. Informed him of the above. He stated that he will cancel the colonoscopy for tomorrow. And that she can remain on clear liquids. Informed pt of this.

## 2023-07-04 ENCOUNTER — Encounter (HOSPITAL_COMMUNITY)
Admission: EM | Disposition: A | Discharge status: Home Health | Payer: Self-pay | Source: Home / Self Care | Attending: Internal Medicine

## 2023-07-04 ENCOUNTER — Encounter (HOSPITAL_COMMUNITY): Payer: Self-pay | Admitting: Internal Medicine

## 2023-07-04 ENCOUNTER — Inpatient Hospital Stay (HOSPITAL_COMMUNITY): Payer: PPO | Admitting: Anesthesiology

## 2023-07-04 DIAGNOSIS — C184 Malignant neoplasm of transverse colon: Secondary | ICD-10-CM | POA: Diagnosis not present

## 2023-07-04 DIAGNOSIS — K5669 Other partial intestinal obstruction: Secondary | ICD-10-CM | POA: Diagnosis not present

## 2023-07-04 DIAGNOSIS — K6389 Other specified diseases of intestine: Secondary | ICD-10-CM | POA: Diagnosis not present

## 2023-07-04 DIAGNOSIS — K648 Other hemorrhoids: Secondary | ICD-10-CM

## 2023-07-04 DIAGNOSIS — K5730 Diverticulosis of large intestine without perforation or abscess without bleeding: Secondary | ICD-10-CM | POA: Diagnosis not present

## 2023-07-04 HISTORY — PX: SUBMUCOSAL TATTOO INJECTION: SHX6856

## 2023-07-04 HISTORY — PX: COLONOSCOPY WITH PROPOFOL: SHX5780

## 2023-07-04 HISTORY — PX: BIOPSY: SHX5522

## 2023-07-04 LAB — BASIC METABOLIC PANEL
Anion gap: 7 (ref 5–15)
BUN: 15 mg/dL (ref 8–23)
CO2: 27 mmol/L (ref 22–32)
Calcium: 8.9 mg/dL (ref 8.9–10.3)
Chloride: 105 mmol/L (ref 98–111)
Creatinine, Ser: 0.52 mg/dL (ref 0.44–1.00)
GFR, Estimated: >60 mL/min (ref >60)
Glucose, Bld: 100 mg/dL — ABNORMAL HIGH (ref 70–99)
Potassium: 3.5 mmol/L (ref 3.5–5.1)
Sodium: 139 mmol/L (ref 135–145)

## 2023-07-04 LAB — CBC
HCT: 27.6 % — ABNORMAL LOW (ref 36.0–46.0)
Hemoglobin: 8.5 g/dL — ABNORMAL LOW (ref 12.0–15.0)
MCH: 24.9 pg — ABNORMAL LOW (ref 26.0–34.0)
MCHC: 30.8 g/dL (ref 30.0–36.0)
MCV: 80.9 fL (ref 80.0–100.0)
Platelets: 475 10*3/uL — ABNORMAL HIGH (ref 150–400)
RBC: 3.41 MIL/uL — ABNORMAL LOW (ref 3.87–5.11)
RDW: 15.8 % — ABNORMAL HIGH (ref 11.5–15.5)
WBC: 9 10*3/uL (ref 4.0–10.5)
nRBC: 0 % (ref 0.0–0.2)

## 2023-07-04 SURGERY — COLONOSCOPY WITH PROPOFOL
Anesthesia: Monitor Anesthesia Care

## 2023-07-04 MED ORDER — PHENYLEPHRINE 80 MCG/ML (10ML) SYRINGE FOR IV PUSH (FOR BLOOD PRESSURE SUPPORT)
PREFILLED_SYRINGE | INTRAVENOUS | Status: DC | PRN
  Administered 2023-07-04 (×2): 160 ug via INTRAVENOUS

## 2023-07-04 MED ORDER — SPOT INK MARKER SYRINGE KIT
PACK | SUBMUCOSAL | Status: DC | PRN
  Administered 2023-07-04: 2 mL via SUBMUCOSAL

## 2023-07-04 MED ORDER — PROPOFOL 1000 MG/100ML IV EMUL
INTRAVENOUS | Status: AC
Start: 2023-07-04 — End: ?
  Filled 2023-07-04: qty 100

## 2023-07-04 MED ORDER — PROPOFOL 10 MG/ML IV BOLUS
INTRAVENOUS | Status: AC
  Filled 2023-07-04: qty 20

## 2023-07-04 MED ORDER — SODIUM CHLORIDE 0.9 % IV SOLN: INTRAVENOUS | Status: DC | PRN

## 2023-07-04 MED ORDER — PROPOFOL 1000 MG/100ML IV EMUL
INTRAVENOUS | Status: AC
  Filled 2023-07-04: qty 100

## 2023-07-04 MED ORDER — SPOT INK MARKER SYRINGE KIT
PACK | SUBMUCOSAL | Status: AC
  Filled 2023-07-04: qty 5

## 2023-07-04 MED ORDER — PROPOFOL 500 MG/50ML IV EMUL
INTRAVENOUS | Status: DC | PRN
  Administered 2023-07-04: 125 ug/kg/min via INTRAVENOUS

## 2023-07-04 MED ORDER — PROPOFOL 10 MG/ML IV BOLUS
INTRAVENOUS | Status: DC | PRN
  Administered 2023-07-04: 30 mg via INTRAVENOUS
  Administered 2023-07-04: 70 mg via INTRAVENOUS

## 2023-07-04 MED ORDER — LIDOCAINE HCL (CARDIAC) PF 100 MG/5ML IV SOSY
PREFILLED_SYRINGE | INTRAVENOUS | Status: DC | PRN
  Administered 2023-07-04: 60 mg via INTRAVENOUS

## 2023-07-04 SURGICAL SUPPLY — 21 items
ELECT REM PT RETURN 9FT ADLT (ELECTROSURGICAL)
ELECTRODE REM PT RTRN 9FT ADLT (ELECTROSURGICAL) IMPLANT
FCP BXJMBJMB 240X2.8X (CUTTING FORCEPS)
FLOOR PAD 36X40 (MISCELLANEOUS) ×2
FORCEPS BIOP RAD 4 LRG CAP 4 (CUTTING FORCEPS) IMPLANT
FORCEPS BIOP RJ4 240 W/NDL (CUTTING FORCEPS)
FORCEPS BXJMBJMB 240X2.8X (CUTTING FORCEPS) IMPLANT
INJECTOR/SNARE I SNARE (MISCELLANEOUS) IMPLANT
LUBRICANT JELLY 4.5OZ STERILE (MISCELLANEOUS) IMPLANT
MANIFOLD NEPTUNE II (INSTRUMENTS) IMPLANT
NDL SCLEROTHERAPY 25GX240 (NEEDLE) IMPLANT
NEEDLE SCLEROTHERAPY 25GX240 (NEEDLE) IMPLANT
PAD FLOOR 36X40 (MISCELLANEOUS) ×2 IMPLANT
PROBE APC STR FIRE (PROBE) IMPLANT
PROBE INJECTION GOLD 7FR (MISCELLANEOUS) IMPLANT
SNARE ROTATE MED OVAL 20MM (MISCELLANEOUS) IMPLANT
SYR 50ML LL SCALE MARK (SYRINGE) IMPLANT
TRAP SPECIMEN MUCOUS 40CC (MISCELLANEOUS) IMPLANT
TUBING ENDO SMARTCAP PENTAX (MISCELLANEOUS) IMPLANT
TUBING IRRIGATION ENDOGATOR (MISCELLANEOUS) ×2 IMPLANT
WATER STERILE IRR 1000ML POUR (IV SOLUTION) IMPLANT

## 2023-07-04 NOTE — Progress Notes (Signed)
Place call to answering service for GI at 1943 due to pt stating that she can't tolerate the prep, again, for the colonoscopy. She reports that it causes her pain and nausea. She wishes to not continue with the prep. Her 2 sons are at bedside and agreed. Dr. Levora Angel returned call at 1955. Informed him of the above. He stated that it is up to the pt to continue with the prep or not. Informed pt of this. Pt continued to decline further consumption of the prep.

## 2023-07-04 NOTE — Anesthesia Postprocedure Evaluation (Signed)
Anesthesia Post Note  Patient: Tina Mccullough  Procedure(s) Performed: COLONOSCOPY WITH PROPOFOL BIOPSY SUBMUCOSAL TATTOO INJECTION     Patient location during evaluation: PACU Anesthesia Type: MAC Level of consciousness: awake and alert Pain management: pain level controlled Vital Signs Assessment: post-procedure vital signs reviewed and stable Respiratory status: spontaneous breathing, nonlabored ventilation and respiratory function stable Cardiovascular status: stable and blood pressure returned to baseline Postop Assessment: no apparent nausea or vomiting Anesthetic complications: no  No notable events documented.  Last Vitals:  Vitals:   07/04/23 1200 07/04/23 1240  BP: 135/71 (!) 137/91  Pulse: 84 85  Resp: 11 18  Temp:  37 C  SpO2: 95% 95%    Last Pain:  Vitals:   07/04/23 1240  TempSrc: Oral  PainSc: 0-No pain                 Saesha Llerenas,W. EDMOND

## 2023-07-04 NOTE — Progress Notes (Signed)
PROGRESS NOTE Tina Mccullough  ZOX:[id number] DOB: [birth year] DOA: 07/01/2023 PCP: [name], MD  Brief Narrative/Hospital Course: 73 year old female history of lymphocytic colitis, COPD DVT PE hypertension recent admission on 11/28 for fall and DVT presented to the [facility] with 4 days of purple stool then some blood, abdominal pain on right side and had outpatient CT scan done that showed a mass and potentially some colitis and sent to the ED In the ED hemodynamically stable-initially tachycardic 117, labs with hypokalemia severe anemia hemoglobin 7.1> 6.3 g and admission was requested to unit PRBC was ordered on admission. CT scan abdomen with contrast 1/23: Obstructing masslike lesion in hepatic flexure suspicious for colon carcinoma dilated ascending colon with mild wall thickening suspicious for colitis mild right colonic wall thickening.    Subjective: Patient seen and examined this morning Again overnight patient unable to tolerate prep GI was informed  Remains afebrile labs fairly stable hemoglobin  Having brown loose light-colored stool-chunks Discussed with her and agreed for colonoscopy this morning Family members including brother at the bedside  Assessment and Plan: Principal Problem:   Colonic mass Active Problems:   DVT (deep venous thrombosis) (HCC)   Essential hypertension   Tobacco abuse   COPD (chronic obstructive pulmonary disease) (HCC)   Hypokalemia   Acute posthemorrhagic anemia   Right sided Colonic mass Severe anemia-ABLA Rectal bleed likely from malignancy: CT abdomen finding concerning for hepatic flexure colon carcinoma, patient has mild ascending colon-right colonic wall thickening question colitis.  Completied 2 units PRBC.  GI surgery following with plan for initial colonoscopy for biopsy-again patient did not tolerate prep very well last night but has liquid light brown stool, going for colonoscopy this morning CEA is high 71- await  biopsy and will need surgery but she is hesitant to get colostomy bag await further discussion with surgery team continue to trend H&H given IV fluids pain management Zofran every 4 hours  PRN. Recent Labs  Lab 07/02/23 2049 07/03/23 0525 07/03/23 1255 07/03/23 1941 07/04/23 0521  HGB 9.0* 8.0* 8.9* 9.1* 8.5*  HCT 29.5* 25.6* 29.1* 29.8* 27.6*    Hypokalemia: Resolved  Hypertension: BP fairly controlled on PRNs   history of DVT/PE last 1 in November: Will hold Xarelto for now in the setting of anemia and colonic mass with ongoing rectal bleeding-needing 2 units of blood transfusion  COPD: Not in exacerbation  Tobacco abuse: Continue nicotine patch  DVT prophylaxis: SCDs Start: 07/01/23 2312 Code Status:   Code Status: Full Code Family Communication: plan of care discussed with patient/family at bedside. Patient status is: Remains hospitalized because of severity of illness Level of care: Med-Surg   Dispo: The patient is from: From home            Anticipated disposition: TBD Objective: Vitals last 24 hrs: Vitals:   07/03/23 1415 07/03/23 1736 07/03/23 2011 07/04/23 0606  BP: (!) 168/90 (!) 144/95 (!) 131/108 (!) 157/95  Pulse: 85 88 89 90  Resp: 18 18 16 18   Temp: 98.7 F (37.1 C) 98.6 F (37 C) 98.6 F (37 C) 98.1 F (36.7 C)  TempSrc: Oral Oral Oral Oral  SpO2: 95% 92% 96% 93%  Weight:      Height:       Weight change:   Physical Examination: General exam: alert awake, oriented  HEENT:Oral mucosa moist, Ear/Nose WNL grossly Respiratory system: Bilaterally clear BS,no use of accessory muscle Cardiovascular system: S1 & S2 +, No JVD. Gastrointestinal system: Abdomen soft,NT,ND, BS+ Nervous  System: Alert, awake, moving all extremities,and following commands. Extremities: LE edema neg,distal peripheral pulses palpable and warm.  Skin: No rashes,no icterus. MSK: Normal muscle bulk,tone, power   Medications reviewed:  Scheduled Meds:  pantoprazole  (PROTONIX) IV  40 mg Intravenous Q12H   Continuous Infusions:  dextrose 5% lactated ringers 40 mL/hr at 07/04/23 0344     Diet Order             Diet NPO time specified  Diet effective midnight                   Intake/Output Summary (Last 24 hours) at 07/04/2023 0806 Last data filed at 07/04/2023 0655 Gross per 24 hour  Intake 1067.21 ml  Output 0 ml  Net 1067.21 ml   Net IO Since Admission: 3,891.68 mL [07/04/23 0806]  Wt Readings from Last 3 Encounters:  07/02/23 65.4 kg  06/14/23 65.4 kg  05/05/23 61.2 kg     Unresulted Labs (From admission, onward)     Start     Ordered   07/04/23 0500  CBC  Daily,   R      07/03/23 1143   07/03/23 0500  Basic metabolic panel  Daily,   R      07/02/23 1132   07/02/23 2000  Hemoglobin and hematocrit, blood  Now then every 8 hours,   R (with TIMED occurrences)      07/02/23 1132           Data Reviewed: I have personally reviewed following labs and imaging studies CBC: Recent Labs  Lab 07/01/23 1652 07/01/23 2129 07/02/23 0118 07/02/23 1039 07/02/23 1211 07/02/23 2049 07/03/23 0525 07/03/23 1255 07/03/23 1941 07/04/23 0521  WBC 10.4  --  13.0* 12.7*  --   --   --   --   --  9.0  HGB 7.1*   < > 6.6* 8.9*   < > 9.0* 8.0* 8.9* 9.1* 8.5*  HCT 23.4*   < > 21.9* 28.1*   < > 29.5* 25.6* 29.1* 29.8* 27.6*  MCV 77.5*  --  79.3* 81.4  --   --   --   --   --  80.9  PLT 710*  --  730* 541*  --   --   --   --   --  475*   < > = values in this interval not displayed.   Basic Metabolic Panel:  Recent Labs  Lab 07/01/23 1652 07/02/23 1039 07/03/23 0525 07/04/23 0521  NA 135 136 137 139  K 3.2* 3.3* 3.9 3.5  CL 102 106 104 105  CO2 23 22 25 27   GLUCOSE 166* 110* 87 100*  BUN 19 20 17 15   CREATININE 0.75 0.69 0.70 0.52  CALCIUM 9.3 8.9 8.5* 8.9   GFR: Estimated Creatinine Clearance: 58.9 mL/min (by C-G formula based on SCr of 0.52 mg/dL). Liver Function Tests:  Recent Labs  Lab 07/01/23 1652 07/02/23 1039  AST  11* 15  ALT 10 13  ALKPHOS 87 73  BILITOT 0.3 0.6  PROT 6.0* 6.0*  ALBUMIN 3.8 2.9*   Recent Labs  Lab 07/01/23 1652  LIPASE <10*   No results for input(s): "AMMONIA" in the last 168 hours. Coagulation Profile:  Recent Labs  Lab 07/01/23 1743  INR 1.0  No results found for this or any previous visit (from the past 240 hours).  Antimicrobials/Microbiology: Anti-infectives (From admission, onward)    None      No results found for: "  SDES", "SPECREQUEST", "CULT", "REPTSTATUS"   Radiology Studies: CT CHEST W CONTRAST Result Date: 07/02/2023 CLINICAL DATA:  Colon cancer staging. EXAM: CT CHEST WITH CONTRAST TECHNIQUE: Multidetector CT imaging of the chest was performed during intravenous contrast administration. RADIATION DOSE REDUCTION: This exam was performed according to the departmental dose-optimization program which includes automated exposure control, adjustment of the mA and/or kV according to patient size and/or use of iterative reconstruction technique. CONTRAST:  75mL OMNIPAQUE IOHEXOL 300 MG/ML  SOLN COMPARISON:  CT abdomen and pelvis 06/30/2022 FINDINGS: Cardiovascular: No significant vascular findings. Normal heart size. No pericardial effusion. Mediastinum/Nodes: Hypodense right thyroid nodule measures 11 mm. There are no enlarged mediastinal or hilar lymph nodes identified. Visualized esophagus is within normal limits. Lungs/Pleura: There is patchy airspace consolidation in the bilateral lower lobes, right greater than left. Mild emphysematous changes are present. There is no pleural effusion or pneumothorax. Upper Abdomen: There are 2 rounded hypodensities in the liver favored as cysts, unchanged. Nodular thickening of the adrenal glands again noted. Musculoskeletal: There are healed posterior left rib fractures. No acute fractures are seen. No focal osseous lesions are identified. IMPRESSION: 1. No evidence for metastatic disease in the chest. 2. Patchy airspace  consolidation in the bilateral lower lobes, right greater than left, worrisome for pneumonia. 3. Emphysema. Emphysema (ICD10-J43.9). Electronically Signed   By: Darliss Cheney M.D.   On: 07/02/2023 19:19     LOS: 3 days   Total time spent in review of labs and imaging, patient evaluation, formulation of plan, documentation and communication with family: 35 minutes  Lanae Boast, MD  [facility]  07/04/2023, 8:06 AM

## 2023-07-04 NOTE — Op Note (Signed)
[facility] Patient Name: Tina Mccullough Procedure Date: 07/04/2023 MRN: [id number] Attending MD: Kathi Der , MD, [id number] Date of Birth: [birth year] CSN: [id number] Age: 73 Admit Type: Inpatient Procedure:                Colonoscopy Indications:              Rectal bleeding, Suspected cancer of the hepatic                            flexure, Abnormal CT of the GI tract Providers:                Kathi Der, MD, Roselie Awkward, RN, Harrington Challenger, Technician Referring MD:              Medicines:                Sedation Administered by an Anesthesia Professional Complications:            No immediate complications. Estimated Blood Loss:     Estimated blood loss was minimal. Procedure:                Pre-Anesthesia Assessment:                           - Prior to the procedure, a History and Physical                            was performed, and patient medications and                            allergies were reviewed. The patient's tolerance of                            previous anesthesia was also reviewed. The risks                            and benefits of the procedure and the sedation                            options and risks were discussed with the patient.                            All questions were answered, and informed consent                            was obtained. Prior Anticoagulants: The patient has                            taken Xarelto (rivaroxaban), last dose was 3 days                            prior to procedure. ASA Grade Assessment: III - A  patient with severe systemic disease. After                            reviewing the risks and benefits, the patient was                            deemed in satisfactory condition to undergo the                            procedure.                           After obtaining informed consent, the colonoscope                             was passed under direct vision. Throughout the                            procedure, the patient's blood pressure, pulse, and                            oxygen saturations were monitored continuously. The                            PCF-HQ190L ([id number]) Olympus colonoscope was                            introduced through the anus with the intention of                            advancing to the cecum. The scope was advanced to                            the transverse colon before the procedure was                            aborted. Medications were given. The colonoscopy                            was performed with moderate difficulty due to a                            partially obstructing mass and inadequate bowel                            prep. The patient tolerated the procedure well. The                            quality of the bowel preparation was poor. The                            rectum was photographed. Scope In: 11:26:04 AM Scope Out: 11:43:02 AM Total Procedure Duration: 0 hours 16 minutes 58 seconds  Findings:      Hemorrhoids  were found on perianal exam.      A fungating, infiltrative and ulcerated obstructing large mass was found       in the proximal transverse colon /Hepatic Flexture. The mass was       circumferential. Oozing was present. Biopsies were taken with a cold       forceps for histology. Distal area was tattooed with an injection of       Spot (carbon black).      A few diverticula were found in the sigmoid colon.      Internal hemorrhoids were found during retroflexion. The hemorrhoids       were small. Impression:               - Preparation of the colon was poor.                           - Hemorrhoids found on perianal exam.                           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                            Biopsied. Tattooed.                           -  Diverticulosis in the sigmoid colon.                           - Internal hemorrhoids. Moderate Sedation:      Moderate (conscious) sedation was personally administered by an       anesthesia professional. The following parameters were monitored: oxygen       saturation, heart rate, blood pressure, and response to care. Recommendation:           - Return patient to hospital ward for ongoing care.                           - Clear liquid diet.                           - Continue present medications.                           - Await pathology results. Procedure Code(s):        --- Professional ---                           [phone], 52, Colonoscopy, flexible; with biopsy,                            single or multiple                           [id number], 52, Colonoscopy, flexible; with directed  submucosal injection(s), any substance Diagnosis Code(s):        --- Professional ---                           K64.8, Other hemorrhoids                           C18.4, Malignant neoplasm of transverse colon                           K56.690, Other partial intestinal obstruction                           K62.5, Hemorrhage of anus and rectum                           K57.30, Diverticulosis of large intestine without                            perforation or abscess without bleeding                           R93.3, Abnormal findings on diagnostic imaging of                            other parts of digestive tract CPT copyright 2022 [employer]. All rights reserved. The codes documented in this report are preliminary and upon coder review may  be revised to meet current compliance requirements. Kathi Der, MD Kathi Der, MD 07/04/2023 11:55:19 AM Number of Addenda: 0

## 2023-07-04 NOTE — Brief Op Note (Signed)
07/04/2023  11:55 AM  PATIENT:  Tina Mccullough  73 y.o. female  PRE-OPERATIVE DIAGNOSIS:  Colon mass  POST-OPERATIVE DIAGNOSIS:  colon mass in transverse colon- biopsied & tattooed  PROCEDURE:  Procedure(s): COLONOSCOPY WITH PROPOFOL (N/A) BIOPSY SUBMUCOSAL TATTOO INJECTION  SURGEON:  Surgeons and Role:    * Anavi Branscum, MD - Primary  Findings ----------- -Colonoscopy showed large ,obstructing and infiltrating mass in the proximal transverse colon/hepatic flexure.  Not able to advance scope beyond this area.  Biopsies taken.  Distal area tattooed. -Poor prep  Recommendations ----------------------- -Clear liquid diet for now -Follow pathology -Further management per surgical team.  Kathi Der MD, FACP 07/04/2023, 11:56 AM  Contact #  [phone]

## 2023-07-04 NOTE — Transfer of Care (Signed)
Immediate Anesthesia Transfer of Care Note  Patient: Tina Mccullough  Procedure(s) Performed: COLONOSCOPY WITH PROPOFOL BIOPSY SUBMUCOSAL TATTOO INJECTION  Patient Location: PACU  Anesthesia Type:MAC  Level of Consciousness: awake and patient cooperative  Airway & Oxygen Therapy: Patient Spontanous Breathing and Patient connected to face mask oxygen  Post-op Assessment: Report given to RN and Post -op Vital signs reviewed and stable  Post vital signs: Reviewed and stable  Last Vitals:  Vitals Value Taken Time  BP 137/91 07/04/23 1240  Temp 37 C 07/04/23 1240  Pulse 85 07/04/23 1240  Resp 18 07/04/23 1240  SpO2 95 % 07/04/23 1240    Last Pain:  Vitals:   07/04/23 1240  TempSrc: Oral  PainSc: 0-No pain      Patients Stated Pain Goal: 2 (07/04/23 0918)  Complications: No notable events documented.

## 2023-07-04 NOTE — Progress Notes (Signed)
[facility] Gastroenterology Progress Note  Tina Mccullough 73 y.o. 1950-12-22  CC: Abdominal pain, colon mass   Subjective: Patient seen and examined at bedside.  Was not able to finish the prep.  Currently having brown-colored stool.  ROS : Afebrile, negative for chest pain   Objective: Vital signs in last 24 hours: Vitals:   07/03/23 2011 07/04/23 0606  BP: (!) 131/108 (!) 157/95  Pulse: 89 90  Resp: 16 18  Temp: 98.6 F (37 C) 98.1 F (36.7 C)  SpO2: 96% 93%    Physical Exam: Sitting comfortably, not in acute distress.  Somewhat emotional and tearful.  Abdomen mildly distended, minimal discomfort on right upper quadrant and right lower quadrant, bowel sound present, no peritoneal signs.  Lab Results: Recent Labs    07/03/23 0525 07/04/23 0521  NA 137 139  K 3.9 3.5  CL 104 105  CO2 25 27  GLUCOSE 87 100*  BUN 17 15  CREATININE 0.70 0.52  CALCIUM 8.5* 8.9   Recent Labs    07/01/23 1652 07/02/23 1039  AST 11* 15  ALT 10 13  ALKPHOS 87 73  BILITOT 0.3 0.6  PROT 6.0* 6.0*  ALBUMIN 3.8 2.9*   Recent Labs    07/02/23 1039 07/02/23 1211 07/03/23 1941 07/04/23 0521  WBC 12.7*  --   --  9.0  HGB 8.9*   < > 9.1* 8.5*  HCT 28.1*   < > 29.8* 27.6*  MCV 81.4  --   --  80.9  PLT 541*  --   --  475*   < > = values in this interval not displayed.   Recent Labs    07/01/23 1743  LABPROT 13.3  INR 1.0      Assessment/Plan: -Rectal bleeding with abnormal CT scan concerning for hepatic flexure mass with local lymph node metastasis.  Normal LFTs.  No liver mets.  Elevated CEA. -Acute blood loss anemia.  Hemoglobin was down to 6.6.  Post blood transfusion. -H/of DVT and PE.  Was on Xarelto.  Last dose to admission. Recommendations ------------------------- -Unfortunately, patient was not able to drink the prep.  Proceed with unprepped colonoscopy.  Risks (bleeding, infection, bowel perforation that could require surgery, sedation-related changes in  cardiopulmonary systems), benefits (identification and possible treatment of source of symptoms, exclusion of certain causes of symptoms), and alternatives (watchful waiting, radiographic imaging studies, empiric medical treatment)  were explained to patient/family in detail and patient wishes to proceed.    Kathi Der MD, FACP 07/04/2023, 10:59 AM  Contact #  [phone]

## 2023-07-04 NOTE — Anesthesia Preprocedure Evaluation (Addendum)
Anesthesia Evaluation  Patient identified by MRN, date of birth, ID band Patient awake    Reviewed: Allergy & Precautions, H&P , NPO status , Patient's Chart, lab work & pertinent test results  History of Anesthesia Complications (+) PONV and history of anesthetic complications  Airway Mallampati: II  TM Distance: >3 FB Neck ROM: Full    Dental no notable dental hx. (+) Teeth Intact, Dental Advisory Given   Pulmonary Current Smoker and Patient abstained from smoking., PE   Pulmonary exam normal breath sounds clear to auscultation       Cardiovascular hypertension, Pt. on medications + DVT   Rhythm:Regular Rate:Normal     Neuro/Psych   Anxiety Depression    negative neurological ROS     GI/Hepatic negative GI ROS, Neg liver ROS,,,Colon mass   Endo/Other  negative endocrine ROS    Renal/GU negative Renal ROS  negative genitourinary   Musculoskeletal   Abdominal   Peds  Hematology  (+) Blood dyscrasia, anemia   Anesthesia Other Findings   Reproductive/Obstetrics negative OB ROS                             Anesthesia Physical Anesthesia Plan  ASA: 3  Anesthesia Plan: MAC   Post-op Pain Management: Minimal or no pain anticipated   Induction: Intravenous  PONV Risk Score and Plan: 2 and Propofol infusion  Airway Management Planned: Natural Airway and Simple Face Mask  Additional Equipment:   Intra-op Plan:   Post-operative Plan:   Informed Consent: I have reviewed the patients History and Physical, chart, labs and discussed the procedure including the risks, benefits and alternatives for the proposed anesthesia with the patient or authorized representative who has indicated his/her understanding and acceptance.     Dental advisory given  Plan Discussed with: CRNA  Anesthesia Plan Comments:        Anesthesia Quick Evaluation

## 2023-07-05 ENCOUNTER — Inpatient Hospital Stay (HOSPITAL_COMMUNITY): Payer: PPO

## 2023-07-05 DIAGNOSIS — M7989 Other specified soft tissue disorders: Secondary | ICD-10-CM | POA: Diagnosis not present

## 2023-07-05 DIAGNOSIS — K6389 Other specified diseases of intestine: Secondary | ICD-10-CM | POA: Diagnosis not present

## 2023-07-05 LAB — BASIC METABOLIC PANEL
Anion gap: 7 (ref 5–15)
BUN: 11 mg/dL (ref 8–23)
CO2: 27 mmol/L (ref 22–32)
Calcium: 8.5 mg/dL — ABNORMAL LOW (ref 8.9–10.3)
Chloride: 103 mmol/L (ref 98–111)
Creatinine, Ser: 0.75 mg/dL (ref 0.44–1.00)
GFR, Estimated: >60 mL/min (ref >60)
Glucose, Bld: 93 mg/dL (ref 70–99)
Potassium: 2.9 mmol/L — ABNORMAL LOW (ref 3.5–5.1)
Sodium: 137 mmol/L (ref 135–145)

## 2023-07-05 LAB — TYPE AND SCREEN
ABO/RH(D): A POS
Antibody Screen: NEGATIVE
Unit division: 0
Unit division: 0

## 2023-07-05 LAB — BPAM RBC
Blood Product Expiration Date: 202502172359
Blood Product Expiration Date: 202502172359
ISSUE DATE / TIME: 202501240247
ISSUE DATE / TIME: 202501240616
Unit Type and Rh: 6200
Unit Type and Rh: 6200

## 2023-07-05 LAB — CBC
HCT: 25.9 % — ABNORMAL LOW (ref 36.0–46.0)
Hemoglobin: 8 g/dL — ABNORMAL LOW (ref 12.0–15.0)
MCH: 24.8 pg — ABNORMAL LOW (ref 26.0–34.0)
MCHC: 30.9 g/dL (ref 30.0–36.0)
MCV: 80.2 fL (ref 80.0–100.0)
Platelets: 424 10*3/uL — ABNORMAL HIGH (ref 150–400)
RBC: 3.23 MIL/uL — ABNORMAL LOW (ref 3.87–5.11)
RDW: 15.9 % — ABNORMAL HIGH (ref 11.5–15.5)
WBC: 8.8 10*3/uL (ref 4.0–10.5)
nRBC: 0 % (ref 0.0–0.2)

## 2023-07-05 LAB — HEPARIN LEVEL (UNFRACTIONATED): Heparin Unfractionated: <0.1 [IU]/mL — ABNORMAL LOW (ref 0.30–0.70)

## 2023-07-05 MED ORDER — HEPARIN (PORCINE) 25000 UT/250ML-% IV SOLN
1200.0000 [IU]/h | INTRAVENOUS | Status: DC
  Administered 2023-07-05: 1000 [IU]/h via INTRAVENOUS
  Filled 2023-07-05: qty 250

## 2023-07-05 MED ORDER — POTASSIUM CHLORIDE CRYS ER 20 MEQ PO TBCR
40.0000 meq | EXTENDED_RELEASE_TABLET | ORAL | Status: AC
  Administered 2023-07-05 (×2): 40 meq via ORAL
  Filled 2023-07-05 (×2): qty 2

## 2023-07-05 MED ORDER — POTASSIUM CHLORIDE 10 MEQ/100ML IV SOLN
10.0000 meq | INTRAVENOUS | Status: AC
  Administered 2023-07-05 (×2): 10 meq via INTRAVENOUS
  Filled 2023-07-05 (×2): qty 100

## 2023-07-05 NOTE — H&P (View-Only) (Signed)
Colonic mass  Subjective: Had abdominal pain, nausea and emesis with prep for c scope and was not able to complete it. She is still passing flatus and liquid stool. On clears with ongoing abd pain and nausea this am but improved from yesterday  Objective: Vital signs in last 24 hours: Temp:  [98.3 F (36.8 C)-98.9 F (37.2 C)] 98.5 F (36.9 C) (01/27 0626) Pulse Rate:  [82-87] 84 (01/27 0626) Resp:  [16-18] 16 (01/27 0626) BP: (137-162)/(91-93) 151/91 (01/27 0626) SpO2:  [92 %-95 %] 92 % (01/27 0626) Last BM Date : 07/03/23  Intake/Output from previous day: 01/26 0701 - 01/27 0700 In: 1105.3 [P.O.:720; I.V.:385.3] Out: -  Intake/Output this shift: Total I/O In: 600 [P.O.:600] Out: -   General appearance: alert and cooperative GI: soft, non-distended  mild TTP RLQ  Lab Results:  Results for orders placed or performed during the hospital encounter of 07/01/23 (from the past 24 hours)  Basic metabolic panel     Status: Abnormal   Collection Time: 07/05/23  4:28 AM  Result Value Ref Range   Sodium 137 135 - 145 mmol/L   Potassium 2.9 (L) 3.5 - 5.1 mmol/L   Chloride 103 98 - 111 mmol/L   CO2 27 22 - 32 mmol/L   Glucose, Bld 93 70 - 99 mg/dL   BUN 11 8 - 23 mg/dL   Creatinine, Ser 1.61 0.44 - 1.00 mg/dL   Calcium 8.5 (L) 8.9 - 10.3 mg/dL   GFR, Estimated >09 >60 mL/min   Anion gap 7 5 - 15  CBC     Status: Abnormal   Collection Time: 07/05/23  4:28 AM  Result Value Ref Range   WBC 8.8 4.0 - 10.5 K/uL   RBC 3.23 (L) 3.87 - 5.11 MIL/uL   Hemoglobin 8.0 (L) 12.0 - 15.0 g/dL   HCT 45.4 (L) 09.8 - 11.9 %   MCV 80.2 80.0 - 100.0 fL   MCH 24.8 (L) 26.0 - 34.0 pg   MCHC 30.9 30.0 - 36.0 g/dL   RDW 14.7 (H) 82.9 - 56.2 %   Platelets 424 (H) 150 - 400 K/uL   nRBC 0.0 0.0 - 0.2 %     Studies/Results Radiology     MEDS, Scheduled  pantoprazole (PROTONIX) IV  40 mg Intravenous Q12H     Assessment/Plan: 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   S/p c scope 1/26 Dr. Doretha Imus - Biopsied -pending. Tattooed CEA elevated to 71.3, CT CH without met disease Hgb 8.0 from 8.5 Having bowel function but ongoing obstructive symptoms on CLD. Continue CLD  She is adamant that she does not want a colostomy and we discussed what progression of disease could look like without surgical intervention Palliative consult  Emergent surgery not indicated at this time. Will continue to follow  FEN: CLD ID: none VTE: held in setting of anemia/bleeding mass  HTN COPD tobacco use LUE dvt - on xarelto op, currently held. Repeat US today. Pending results may need hep gtt. If clinically reasonable please continue to hold for now   LOS: 4 days   Eric Form, [facility] 07/05/2023, 12:22 PM Please see Amion for pager number during day hours 7:00am-4:30pm

## 2023-07-05 NOTE — Plan of Care (Signed)

## 2023-07-05 NOTE — Progress Notes (Signed)
Colonic mass  Subjective: Had abdominal pain, nausea and emesis with prep for c scope and was not able to complete it. She is still passing flatus and liquid stool. On clears with ongoing abd pain and nausea this am but improved from yesterday  Objective: Vital signs in last 24 hours: Temp:  [98.3 F (36.8 C)-98.9 F (37.2 C)] 98.5 F (36.9 C) (01/27 0626) Pulse Rate:  [82-87] 84 (01/27 0626) Resp:  [16-18] 16 (01/27 0626) BP: (137-162)/(91-93) 151/91 (01/27 0626) SpO2:  [92 %-95 %] 92 % (01/27 0626) Last BM Date : 07/03/23  Intake/Output from previous day: 01/26 0701 - 01/27 0700 In: 1105.3 [P.O.:720; I.V.:385.3] Out: -  Intake/Output this shift: Total I/O In: 600 [P.O.:600] Out: -   General appearance: alert and cooperative GI: soft, non-distended  mild TTP RLQ  Lab Results:  Results for orders placed or performed during the hospital encounter of 07/01/23 (from the past 24 hours)  Basic metabolic panel     Status: Abnormal   Collection Time: 07/05/23  4:28 AM  Result Value Ref Range   Sodium 137 135 - 145 mmol/L   Potassium 2.9 (L) 3.5 - 5.1 mmol/L   Chloride 103 98 - 111 mmol/L   CO2 27 22 - 32 mmol/L   Glucose, Bld 93 70 - 99 mg/dL   BUN 11 8 - 23 mg/dL   Creatinine, Ser 1.61 0.44 - 1.00 mg/dL   Calcium 8.5 (L) 8.9 - 10.3 mg/dL   GFR, Estimated >09 >60 mL/min   Anion gap 7 5 - 15  CBC     Status: Abnormal   Collection Time: 07/05/23  4:28 AM  Result Value Ref Range   WBC 8.8 4.0 - 10.5 K/uL   RBC 3.23 (L) 3.87 - 5.11 MIL/uL   Hemoglobin 8.0 (L) 12.0 - 15.0 g/dL   HCT 45.4 (L) 09.8 - 11.9 %   MCV 80.2 80.0 - 100.0 fL   MCH 24.8 (L) 26.0 - 34.0 pg   MCHC 30.9 30.0 - 36.0 g/dL   RDW 14.7 (H) 82.9 - 56.2 %   Platelets 424 (H) 150 - 400 K/uL   nRBC 0.0 0.0 - 0.2 %     Studies/Results Radiology     MEDS, Scheduled  pantoprazole (PROTONIX) IV  40 mg Intravenous Q12H     Assessment/Plan: 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   S/p c scope 1/26 Dr. Doretha Imus - Biopsied -pending. Tattooed CEA elevated to 71.3, CT CH without met disease Hgb 8.0 from 8.5 Having bowel function but ongoing obstructive symptoms on CLD. Continue CLD  She is adamant that she does not want a colostomy and we discussed what progression of disease could look like without surgical intervention Palliative consult  Emergent surgery not indicated at this time. Will continue to follow  FEN: CLD ID: none VTE: held in setting of anemia/bleeding mass  HTN COPD tobacco use LUE dvt - on xarelto op, currently held. Repeat US today. Pending results may need hep gtt. If clinically reasonable please continue to hold for now   LOS: 4 days   Eric Form, [facility] 07/05/2023, 12:22 PM Please see Amion for pager number during day hours 7:00am-4:30pm

## 2023-07-05 NOTE — Progress Notes (Signed)
- PROGRESS NOTE Tina Mccullough  ZOX:[id number] DOB: [birth year] DOA: 07/01/2023 PCP: [name], MD  Brief Narrative/Hospital Course: 73 year old female history of lymphocytic colitis, COPD DVT PE hypertension recent admission on 11/28 for fall and DVT presented to the [facility] with 4 days of purple stool then some blood, abdominal pain on right side and had outpatient CT scan done that showed a mass and potentially some colitis and sent to the ED In the ED hemodynamically stable-initially tachycardic 117, labs with hypokalemia severe anemia hemoglobin 7.1> 6.3 g and admission was requested to unit PRBC was ordered on admission. CT scan abdomen with contrast 1/23: Obstructing masslike lesion in hepatic flexure suspicious for colon carcinoma dilated ascending colon with mild wall thickening suspicious for colitis mild right colonic wall thickening. Patient had difficulty tolerating colon prep underwent colonoscopy 07/04/23 with poor prep showed large obstructing and infiltrative mass in the proximal transverse colon/hepatic flexure but able to advance the scope beyond this area biopsies taken.    Subjective: Examined this morning Some loose stool Having ongoing pain on right upper quadrant Overnight afebrile, BP 150s, labs overall stable CBC, severe hypokalemia 2.9 getting replacement This am swelling of the left arm and IV has been removed  Assessment and Plan: Principal Problem:   Colonic mass Active Problems:   DVT (deep venous thrombosis) (HCC)   Essential hypertension   Tobacco abuse   COPD (chronic obstructive pulmonary disease) (HCC)   Hypokalemia   Acute posthemorrhagic anemia  Obstructing and infiltrating proximal transverse colon/hepatic flexure mass ABLA 2/2 bleeding colon mass: CT abdomen finding concerning for hepatic flexure colon carcinoma, patient has mild ascending colon-right colonic wall thickening question colitis.  Completied 2 units PRBC-Hb stable.  CT  chest no evidence of metastasis. GI surgery following  had difficulty tolerating colon prep underwent colonoscopy 07/04/23 with poor prep showed large obstructing and infiltrative mass in the proximal transverse colon/hepatic flexure but able to advance the scope beyond this area biopsies taken. Cont diet, await biopsy and will likely need surgery this week- but she is hesitant to get colostomy bag  Recent Labs  Lab 07/03/23 0525 07/03/23 1255 07/03/23 1941 07/04/23 0521 07/05/23 0428  HGB 8.0* 8.9* 9.1* 8.5* 8.0*  HCT 25.6* 29.1* 29.8* 27.6* 25.9*    Hypokalemia: Replacing aggressively po and iv.  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 With colon mass and ABLA unable to continue Xarelto -discussed with surgery okay to resume heparin without bolus this morning we will watch closely for bleeding   COPD: Not in exacerbation  Tobacco abuse: Continue nicotine patch  DVT prophylaxis: SCDs Start: 07/01/23 2312 Code Status:   Code Status: Full Code Family Communication: plan of care discussed with patient/family at bedside. Patient status is: Remains hospitalized because of severity of illness Level of care: Med-Surg   Dispo: The patient is from: From home            Anticipated disposition: TBD Objective: Vitals last 24 hrs: Vitals:   07/04/23 1240 07/04/23 1556 07/04/23 2218 07/05/23 0626  BP: (!) 137/91 (!) 162/93 (!) 154/93 (!) 151/91  Pulse: 85 87 82 84  Resp: 18 18 16 16   Temp: 98.6 F (37 C) 98.3 F (36.8 C) 98.9 F (37.2 C) 98.5 F (36.9 C)  TempSrc: Oral Oral Oral Oral  SpO2: 95% 94% 93% 92%  Weight:  Height:       Weight change:   Physical Examination: General exam: alert awake, oriented  HEENT:Oral mucosa moist, Ear/Nose WNL grossly Respiratory system: Bilaterally clear BS,no use of accessory muscle Cardiovascular  system: S1 & S2 +, No JVD. Gastrointestinal system: Abdomen soft,NT,ND, BS+ Nervous System: Alert, awake, moving all extremities,and following commands. Extremities: LE edema neg,distal peripheral pulses palpable and warm.  Swollen left upper arm Skin: No rashes,no icterus. MSK: Normal muscle bulk,tone, power    Medications reviewed:  Scheduled Meds:  pantoprazole (PROTONIX) IV  40 mg Intravenous Q12H   Continuous Infusions:  potassium chloride 10 mEq (07/05/23 1013)     Diet Order             Diet clear liquid Room service appropriate? Yes; Fluid consistency: Thin  Diet effective now                   Intake/Output Summary (Last 24 hours) at 07/05/2023 1056 Last data filed at 07/05/2023 0600 Gross per 24 hour  Intake 1105.27 ml  Output --  Net 1105.27 ml   Net IO Since Admission: 4,996.95 mL [07/05/23 1056]  Wt Readings from Last 3 Encounters:  07/02/23 65.4 kg  06/14/23 65.4 kg  05/05/23 61.2 kg     Unresulted Labs (From admission, onward)     Start     Ordered   07/04/23 0500  CBC  Daily,   R      07/03/23 1143   07/03/23 0500  Basic metabolic panel  Daily,   R      07/02/23 1132   07/02/23 2000  Hemoglobin and hematocrit, blood  Now then every 8 hours,   R (with TIMED occurrences)      07/02/23 1132           Data Reviewed: I have personally reviewed following labs and imaging studies CBC: Recent Labs  Lab 07/01/23 1652 07/01/23 2129 07/02/23 0118 07/02/23 1039 07/02/23 1211 07/03/23 0525 07/03/23 1255 07/03/23 1941 07/04/23 0521 07/05/23 0428  WBC 10.4  --  13.0* 12.7*  --   --   --   --  9.0 8.8  HGB 7.1*   < > 6.6* 8.9*   < > 8.0* 8.9* 9.1* 8.5* 8.0*  HCT 23.4*   < > 21.9* 28.1*   < > 25.6* 29.1* 29.8* 27.6* 25.9*  MCV 77.5*  --  79.3* 81.4  --   --   --   --  80.9 80.2  PLT 710*  --  730* 541*  --   --   --   --  475* 424*   < > = values in this interval not displayed.   Basic Metabolic Panel:  Recent Labs  Lab 07/01/23 1652  07/02/23 1039 07/03/23 0525 07/04/23 0521 07/05/23 0428  NA 135 136 137 139 137  K 3.2* 3.3* 3.9 3.5 2.9*  CL 102 106 104 105 103  CO2 23 22 25 27 27   GLUCOSE 166* 110* 87 100* 93  BUN 19 20 17 15 11   CREATININE 0.75 0.69 0.70 0.52 0.75  CALCIUM 9.3 8.9 8.5* 8.9 8.5*   GFR: Estimated Creatinine Clearance: 58.9 mL/min (by C-G formula based on SCr of 0.75 mg/dL). Liver Function Tests:  Recent Labs  Lab 07/01/23 1652 07/02/23 1039  AST 11* 15  ALT 10 13  ALKPHOS 87 73  BILITOT 0.3 0.6  PROT 6.0* 6.0*  ALBUMIN 3.8 2.9*   Recent Labs  Lab 07/01/23 1652  LIPASE <  10*   No results for input(s): "AMMONIA" in the last 168 hours. Coagulation Profile:  Recent Labs  Lab 07/01/23 1743  INR 1.0  No results found for this or any previous visit (from the past 240 hours).  Antimicrobials/Microbiology: Anti-infectives (From admission, onward)    None      No results found for: "SDES", "SPECREQUEST", "CULT", "REPTSTATUS"   Radiology Studies: No results found.    LOS: 4 days   Total time spent in review of labs and imaging, patient evaluation, formulation of plan, documentation and communication with family: 35 minutes  Lanae Boast, MD  [facility]  07/05/2023, 10:56 AM

## 2023-07-05 NOTE — Progress Notes (Addendum)
PHARMACY - ANTICOAGULATION CONSULT NOTE  Pharmacy Consult for Heparin Indication: UE DVT  Allergies  Allergen Reactions   Beef-Derived Drug Products Hives   Dog Epithelium (Canis Lupus Familiaris) Anaphylaxis   Pork-Derived Products Hives   Bee Venom Hives   Diclofenac Nausea And Vomiting   Lisinopril Other (See Comments)    Headaches    Patient Measurements: Height: 5\' 5"  (165.1 cm) Weight: 65.4 kg (144 lb 2.9 oz) IBW/kg (Calculated) : 57 Heparin Dosing Weight: TBW  Vital Signs: Temp: 98.4 F (36.9 C) (01/27 1447) Temp Source: Oral (01/27 1447) BP: 142/80 (01/27 1447) Pulse Rate: 87 (01/27 1447)  Labs: Recent Labs    07/03/23 0525 07/03/23 1255 07/03/23 1941 07/04/23 0521 07/05/23 0428 07/05/23 1152  HGB 8.0*   < > 9.1* 8.5* 8.0*  --   HCT 25.6*   < > 29.8* 27.6* 25.9*  --   PLT  --   --   --  475* 424*  --   HEPARINUNFRC  --   --   --   --   --  <0.10*  CREATININE 0.70  --   --  0.52 0.75  --    < > = values in this interval not displayed.    Estimated Creatinine Clearance: 58.9 mL/min (by C-G formula based on SCr of 0.75 mg/dL).   Medical History: Past Medical History:  Diagnosis Date   Anxiety    Depression    DVT of axillary vein, acute left (HCC)    left subclavian vein   Hypertension    PONV (postoperative nausea and vomiting)    Pulmonary embolus (HCC)     Medications:  -Xarelto 20mg  daily PTA - last dose 06/30/23  Assessment:/ 73 yo F on Xarelto PTA for hx DVT/PE (04/2023) presented with BRBPR.  She is s/p colonoscopy 11/26 which revealed obstructing mass.  Surgery is recommending partial colectomy.    Now + new UE DVT.  Pharmacy consulted to start IV heparin without bolus.   Pork allergy noted.  She has hx alpha-gal and does not eat pork, however verified with patient that she tolerated heparin in November 2024 without any reaction and is ok to proceed with heparin as ordered.   Baseline labs: Hg 8.0 (low, rectal bleeding noted); pltc  424- elevated Heparin level <0.1, scr 0.75  Goal of Therapy:  Heparin level 0.3-0.7 units/ml Monitor platelets by anticoagulation protocol: Yes   Plan:  Start IV heparin 1000 units/hr.  No bolus due to recent procedure. Check 8h heparin level  Daily heparin level & CBC while on heparin F/U plans for surgery-->surgery team will need to specify when to hold heparin prior to procedure if pt elects to proceed.  Junita Push PharmD 07/05/2023,3:17 PM

## 2023-07-05 NOTE — Progress Notes (Signed)
LUE venous duplex has been completed.  Previous results given to Dr. Dayna Barker.   Results can be found under chart review under CV PROC. 07/05/2023 3:29 PM Richad Ramsay RVT, RDMS

## 2023-07-06 ENCOUNTER — Inpatient Hospital Stay (HOSPITAL_COMMUNITY): Payer: PPO | Admitting: Certified Registered"

## 2023-07-06 ENCOUNTER — Other Ambulatory Visit: Payer: Self-pay

## 2023-07-06 ENCOUNTER — Encounter (HOSPITAL_COMMUNITY): Payer: Self-pay | Admitting: Internal Medicine

## 2023-07-06 ENCOUNTER — Encounter (HOSPITAL_COMMUNITY)
Admission: EM | Disposition: A | Discharge status: Home Health | Payer: Self-pay | Source: Home / Self Care | Attending: Internal Medicine

## 2023-07-06 DIAGNOSIS — Z515 Encounter for palliative care: Secondary | ICD-10-CM

## 2023-07-06 DIAGNOSIS — C183 Malignant neoplasm of hepatic flexure: Secondary | ICD-10-CM

## 2023-07-06 DIAGNOSIS — K6389 Other specified diseases of intestine: Secondary | ICD-10-CM | POA: Diagnosis not present

## 2023-07-06 HISTORY — PX: PARTIAL COLECTOMY: SHX5273

## 2023-07-06 LAB — CBC
HCT: 25.7 % — ABNORMAL LOW (ref 36.0–46.0)
Hemoglobin: 8.1 g/dL — ABNORMAL LOW (ref 12.0–15.0)
MCH: 25.3 pg — ABNORMAL LOW (ref 26.0–34.0)
MCHC: 31.5 g/dL (ref 30.0–36.0)
MCV: 80.3 fL (ref 80.0–100.0)
Platelets: 417 10*3/uL — ABNORMAL HIGH (ref 150–400)
RBC: 3.2 MIL/uL — ABNORMAL LOW (ref 3.87–5.11)
RDW: 16 % — ABNORMAL HIGH (ref 11.5–15.5)
WBC: 8.3 10*3/uL (ref 4.0–10.5)
nRBC: 0 % (ref 0.0–0.2)

## 2023-07-06 LAB — BASIC METABOLIC PANEL
Anion gap: 6 (ref 5–15)
BUN: 9 mg/dL (ref 8–23)
CO2: 26 mmol/L (ref 22–32)
Calcium: 8.6 mg/dL — ABNORMAL LOW (ref 8.9–10.3)
Chloride: 105 mmol/L (ref 98–111)
Creatinine, Ser: 0.54 mg/dL (ref 0.44–1.00)
GFR, Estimated: >60 mL/min (ref >60)
Glucose, Bld: 92 mg/dL (ref 70–99)
Potassium: 3.5 mmol/L (ref 3.5–5.1)
Sodium: 137 mmol/L (ref 135–145)

## 2023-07-06 LAB — SURGICAL PATHOLOGY

## 2023-07-06 LAB — HEPARIN LEVEL (UNFRACTIONATED): Heparin Unfractionated: <0.1 [IU]/mL — ABNORMAL LOW (ref 0.30–0.70)

## 2023-07-06 LAB — TYPE AND SCREEN
ABO/RH(D): A POS
Antibody Screen: NEGATIVE

## 2023-07-06 SURGERY — COLECTOMY, PARTIAL
Anesthesia: General | Site: Abdomen | Laterality: Right

## 2023-07-06 MED ORDER — ALBUMIN HUMAN 5 % IV SOLN: INTRAVENOUS | Status: DC | PRN

## 2023-07-06 MED ORDER — ONDANSETRON HCL 4 MG/2ML IJ SOLN
INTRAMUSCULAR | Status: AC
  Filled 2023-07-06: qty 2

## 2023-07-06 MED ORDER — ACETAMINOPHEN 325 MG PO TABS: 325.0000 mg | ORAL_TABLET | Freq: Once | ORAL | Status: DC | PRN

## 2023-07-06 MED ORDER — ACETAMINOPHEN 10 MG/ML IV SOLN
1000.0000 mg | Freq: Once | INTRAVENOUS | Status: DC | PRN
  Administered 2023-07-06: 1000 mg via INTRAVENOUS

## 2023-07-06 MED ORDER — PROPOFOL 10 MG/ML IV BOLUS
INTRAVENOUS | Status: DC | PRN
  Administered 2023-07-06: 110 mg via INTRAVENOUS

## 2023-07-06 MED ORDER — DEXAMETHASONE SODIUM PHOSPHATE 10 MG/ML IJ SOLN
INTRAMUSCULAR | Status: DC | PRN
  Administered 2023-07-06: 4 mg via INTRAVENOUS

## 2023-07-06 MED ORDER — LACTATED RINGERS IV SOLN: INTRAVENOUS | Status: DC | PRN

## 2023-07-06 MED ORDER — PROPOFOL 10 MG/ML IV BOLUS
INTRAVENOUS | Status: AC
  Filled 2023-07-06: qty 20

## 2023-07-06 MED ORDER — SODIUM CHLORIDE (PF) 0.9 % IJ SOLN
INTRAMUSCULAR | Status: AC
  Filled 2023-07-06: qty 10

## 2023-07-06 MED ORDER — ALBUMIN HUMAN 5 % IV SOLN
INTRAVENOUS | Status: AC
  Filled 2023-07-06: qty 250

## 2023-07-06 MED ORDER — FENTANYL CITRATE (PF) 100 MCG/2ML IJ SOLN
INTRAMUSCULAR | Status: AC
  Filled 2023-07-06: qty 2

## 2023-07-06 MED ORDER — METHOCARBAMOL 1000 MG/10ML IJ SOLN
500.0000 mg | Freq: Four times a day (QID) | INTRAMUSCULAR | Status: DC | PRN
  Administered 2023-07-06 – 2023-07-07 (×2): 500 mg via INTRAVENOUS
  Filled 2023-07-06 (×2): qty 10

## 2023-07-06 MED ORDER — ROCURONIUM BROMIDE 10 MG/ML (PF) SYRINGE
PREFILLED_SYRINGE | INTRAVENOUS | Status: AC
  Filled 2023-07-06: qty 10

## 2023-07-06 MED ORDER — PHENYLEPHRINE HCL-NACL 20-0.9 MG/250ML-% IV SOLN
INTRAVENOUS | Status: DC | PRN
  Administered 2023-07-06: 20 ug/min via INTRAVENOUS

## 2023-07-06 MED ORDER — HYDROMORPHONE HCL 1 MG/ML IJ SOLN
0.5000 mg | INTRAMUSCULAR | Status: DC | PRN
Start: 2023-07-06 — End: 2023-07-13
  Administered 2023-07-06 – 2023-07-13 (×15): 1 mg via INTRAVENOUS
  Filled 2023-07-06 (×17): qty 1

## 2023-07-06 MED ORDER — HYDROMORPHONE HCL 2 MG/ML IJ SOLN
INTRAMUSCULAR | Status: AC
  Filled 2023-07-06: qty 1

## 2023-07-06 MED ORDER — 0.9 % SODIUM CHLORIDE (POUR BTL) OPTIME
TOPICAL | Status: DC | PRN
Start: 2023-07-06 — End: 2023-07-06
  Administered 2023-07-06 (×2): 1000 mL

## 2023-07-06 MED ORDER — ACETAMINOPHEN 160 MG/5ML PO SOLN: 325.0000 mg | Freq: Once | ORAL | Status: DC | PRN

## 2023-07-06 MED ORDER — FENTANYL CITRATE (PF) 100 MCG/2ML IJ SOLN
INTRAMUSCULAR | Status: DC | PRN
  Administered 2023-07-06 (×2): 50 ug via INTRAVENOUS
  Administered 2023-07-06: 100 ug via INTRAVENOUS
  Administered 2023-07-06 (×2): 50 ug via INTRAVENOUS

## 2023-07-06 MED ORDER — ACETAMINOPHEN 10 MG/ML IV SOLN
INTRAVENOUS | Status: AC
  Filled 2023-07-06: qty 100

## 2023-07-06 MED ORDER — SUGAMMADEX SODIUM 200 MG/2ML IV SOLN
INTRAVENOUS | Status: DC | PRN
  Administered 2023-07-06: 130 mg via INTRAVENOUS
  Administered 2023-07-06: 70 mg via INTRAVENOUS

## 2023-07-06 MED ORDER — ACETAMINOPHEN 500 MG PO TABS: 1000.0000 mg | ORAL_TABLET | Freq: Four times a day (QID) | ORAL | Status: DC

## 2023-07-06 MED ORDER — ONDANSETRON HCL 4 MG/2ML IJ SOLN
INTRAMUSCULAR | Status: DC | PRN
  Administered 2023-07-06: 4 mg via INTRAVENOUS

## 2023-07-06 MED ORDER — LACTATED RINGERS IV SOLN: INTRAVENOUS | Status: DC

## 2023-07-06 MED ORDER — MEPERIDINE HCL 50 MG/ML IJ SOLN: 6.2500 mg | INTRAMUSCULAR | Status: DC | PRN

## 2023-07-06 MED ORDER — MIDAZOLAM HCL 2 MG/2ML IJ SOLN
INTRAMUSCULAR | Status: AC
  Filled 2023-07-06: qty 2

## 2023-07-06 MED ORDER — CEFAZOLIN SODIUM-DEXTROSE 2-4 GM/100ML-% IV SOLN
2.0000 g | INTRAVENOUS | Status: AC
  Administered 2023-07-06: 2 g via INTRAVENOUS
  Filled 2023-07-06: qty 100

## 2023-07-06 MED ORDER — HEPARIN (PORCINE) 25000 UT/250ML-% IV SOLN
1450.0000 [IU]/h | INTRAVENOUS | Status: DC
  Administered 2023-07-07: 1300 [IU]/h via INTRAVENOUS
  Administered 2023-07-08 – 2023-07-09 (×3): 1450 [IU]/h via INTRAVENOUS
  Filled 2023-07-06 (×4): qty 250

## 2023-07-06 MED ORDER — ACETAMINOPHEN 500 MG PO TABS
1000.0000 mg | ORAL_TABLET | Freq: Four times a day (QID) | ORAL | Status: DC
  Administered 2023-07-06 – 2023-07-17 (×37): 1000 mg via ORAL
  Filled 2023-07-06 (×40): qty 2

## 2023-07-06 MED ORDER — ROCURONIUM BROMIDE 10 MG/ML (PF) SYRINGE
PREFILLED_SYRINGE | INTRAVENOUS | Status: DC | PRN
  Administered 2023-07-06: 30 mg via INTRAVENOUS
  Administered 2023-07-06: 50 mg via INTRAVENOUS

## 2023-07-06 MED ORDER — HYDROMORPHONE HCL 1 MG/ML IJ SOLN
0.2500 mg | INTRAMUSCULAR | Status: DC | PRN
  Administered 2023-07-06 (×4): .5 mg via INTRAVENOUS

## 2023-07-06 MED ORDER — DROPERIDOL 2.5 MG/ML IJ SOLN: 0.6250 mg | Freq: Once | INTRAMUSCULAR | Status: DC | PRN

## 2023-07-06 MED ORDER — MIDAZOLAM HCL 2 MG/2ML IJ SOLN
INTRAMUSCULAR | Status: DC | PRN
  Administered 2023-07-06: 2 mg via INTRAVENOUS

## 2023-07-06 MED ORDER — GABAPENTIN 100 MG PO CAPS
300.0000 mg | ORAL_CAPSULE | Freq: Three times a day (TID) | ORAL | Status: DC
  Administered 2023-07-06 – 2023-07-10 (×13): 300 mg via ORAL
  Filled 2023-07-06 (×14): qty 3

## 2023-07-06 MED ORDER — LIDOCAINE 2% (20 MG/ML) 5 ML SYRINGE
INTRAMUSCULAR | Status: DC | PRN
  Administered 2023-07-06: 100 mg via INTRAVENOUS

## 2023-07-06 MED ORDER — DEXAMETHASONE SODIUM PHOSPHATE 10 MG/ML IJ SOLN
INTRAMUSCULAR | Status: AC
  Filled 2023-07-06: qty 1

## 2023-07-06 SURGICAL SUPPLY — 37 items
CELLS DAT CNTRL 66122 CELL SVR (MISCELLANEOUS) IMPLANT
CHLORAPREP W/TINT 26 (MISCELLANEOUS) IMPLANT
COVER SURGICAL LIGHT HANDLE (MISCELLANEOUS) ×2 IMPLANT
DRSG OPSITE POSTOP 4X8 (GAUZE/BANDAGES/DRESSINGS) IMPLANT
ELECT PENCIL ROCKER SW 15FT (MISCELLANEOUS) ×1 IMPLANT
ELECT REM PT RETURN 15FT ADLT (MISCELLANEOUS) ×1 IMPLANT
GLOVE BIO SURGEON STRL SZ 6 (GLOVE) ×2 IMPLANT
GLOVE INDICATOR 6.5 STRL GRN (GLOVE) ×2 IMPLANT
GOWN STRL REUS W/ TWL LRG LVL3 (GOWN DISPOSABLE) ×1 IMPLANT
GOWN STRL REUS W/ TWL XL LVL3 (GOWN DISPOSABLE) IMPLANT
KIT TURNOVER KIT A (KITS) IMPLANT
LIGASURE IMPACT 36 18CM CVD LR (INSTRUMENTS) IMPLANT
PACK COLON (CUSTOM PROCEDURE TRAY) ×1 IMPLANT
RELOAD PROXIMATE 75MM BLUE (ENDOMECHANICALS) ×2 IMPLANT
RELOAD STAPLE 75 3.8 BLU REG (ENDOMECHANICALS) IMPLANT
RETRACTOR WND ALEXIS 18 MED (MISCELLANEOUS) IMPLANT
RETRACTOR WND ALEXIS 25 LRG (MISCELLANEOUS) IMPLANT
RETRACTOR WOUND ALXS 34CM XLRG (MISCELLANEOUS) IMPLANT
RTRCTR WOUND ALEXIS 18CM MED (MISCELLANEOUS)
RTRCTR WOUND ALEXIS 25CM LRG (MISCELLANEOUS) ×1
RTRCTR WOUND ALEXIS 34CM XLRG (MISCELLANEOUS)
SPONGE T-LAP 18X18 ~~LOC~~+RFID (SPONGE) IMPLANT
STAPLER GUN LINEAR PROX 60 (STAPLE) IMPLANT
STAPLER PROXIMATE 75MM BLUE (STAPLE) IMPLANT
STAPLER SKIN PROX WIDE 3.9 (STAPLE) IMPLANT
SUT ETHILON 2 0 PS N (SUTURE) IMPLANT
SUT MNCRL AB 4-0 PS2 18 (SUTURE) IMPLANT
SUT PDS AB 1 TP1 96 (SUTURE) ×2 IMPLANT
SUT SILK 2 0 SH CR/8 (SUTURE) ×1 IMPLANT
SUT SILK 2-0 18XBRD TIE 12 (SUTURE) ×1 IMPLANT
SUT SILK 3 0 SH CR/8 (SUTURE) ×1 IMPLANT
SUT SILK 3-0 18XBRD TIE 12 (SUTURE) ×1 IMPLANT
TOWEL GREEN STERILE FF (TOWEL DISPOSABLE) ×1 IMPLANT
TOWEL OR 17X26 10 PK STRL BLUE (TOWEL DISPOSABLE) IMPLANT
TRAY FOLEY MTR SLVR 14FR STAT (SET/KITS/TRAYS/PACK) ×1 IMPLANT
TRAY FOLEY MTR SLVR 16FR STAT (SET/KITS/TRAYS/PACK) ×1 IMPLANT
TUBING CONNECTING 10 (TUBING) ×2 IMPLANT

## 2023-07-06 NOTE — Interval H&P Note (Signed)
History and Physical Interval Note:  07/06/2023 8:54 AM  Tina Mccullough  has presented today for surgery, with the diagnosis of COLON CANCER.  The various methods of treatment have been discussed with the patient and family. After consideration of risks, benefits and other options for treatment, the patient has consented to  Procedure(s): PARTIAL COLECTOMY (Right) as a surgical intervention.  The patient's history has been reviewed, patient examined, no change in status, stable for surgery.  I have reviewed the patient's chart and labs.  Questions were answered to the patient's satisfaction.     Tina Mccullough

## 2023-07-06 NOTE — Transfer of Care (Signed)
Immediate Anesthesia Transfer of Care Note  Patient: Tina Mccullough  Procedure(s) Performed: PARTIAL COLECTOMY (Right: Abdomen)  Patient Location: PACU  Anesthesia Type:General  Level of Consciousness: awake, alert , and patient cooperative  Airway & Oxygen Therapy: Patient Spontanous Breathing and Patient connected to face mask oxygen  Post-op Assessment: Report given to RN and Post -op Vital signs reviewed and stable  Post vital signs: Reviewed and stable  Last Vitals:  Vitals Value Taken Time  BP 156/117 07/06/23 1342  Temp 36.9 C 07/06/23 1342  Pulse 93 07/06/23 1344  Resp 9 07/06/23 1344  SpO2 99 % 07/06/23 1344  Vitals shown include unfiled device data.  Last Pain:  Vitals:   07/06/23 1022  TempSrc:   PainSc: 7       Patients Stated Pain Goal: 2 (07/06/23 0731)  Complications: No notable events documented.

## 2023-07-06 NOTE — Progress Notes (Signed)
- PROGRESS NOTE Tina Mccullough  GNF:[id number] DOB: 05-24-1951 DOA: 07/01/2023 PCP: [name], MD  Brief Narrative/Hospital Course: 73 year old female history of lymphocytic colitis, COPD DVT PE hypertension recent admission on 11/28 for fall and DVT presented to the [facility] ED with 4 days of purple stool then some blood, abdominal pain on right side and had outpatient CT scan done that showed a mass and potentially some colitis and sent to the ED In the ED hemodynamically stable-initially tachycardic 117, labs with hypokalemia severe anemia hemoglobin 7.1> 6.3 g and admission was requested to unit PRBC was ordered on admission. CT scan abdomen with contrast 1/23: Obstructing masslike lesion in hepatic flexure suspicious for colon carcinoma dilated ascending colon with mild wall thickening suspicious for colitis mild right colonic wall thickening. Patient had difficulty tolerating colon prep underwent colonoscopy 07/04/23 with poor prep showed large obstructing and infiltrative mass in the proximal transverse colon/hepatic flexure but able to advance the scope beyond this area biopsies taken.    Subjective: Seen this am Family at bedside Overnight afebrile BP stable  She is going to surgery today and is npo Some pain  Assessment and Plan: Principal Problem:   Colonic mass Active Problems:   DVT (deep venous thrombosis) (HCC)   Essential hypertension   Tobacco abuse   COPD (chronic obstructive pulmonary disease) (HCC)   Hypokalemia   Acute posthemorrhagic anemia  Obstructing and infiltrating proximal transverse colon/hepatic flexure mass-CA 19-19 111, CEA 71. ABLA 2/2 bleeding colon mass: CT abdomen finding concerning for hepatic flexure colon carcinoma, patient has mild ascending colon-right colonic wall thickening question colitis.  Completied 2 units PRBC-Hb stable.  CT chest no evidence of metastasis. GI surgery following  had difficulty tolerating colon prep underwent  colonoscopy 07/04/23 with poor prep showed large obstructing and infiltrative mass in the proximal transverse colon/hepatic flexure but able to advance the scope beyond this area biopsies taken for OR today Recent Labs  Lab 07/03/23 1255 07/03/23 1941 07/04/23 0521 07/05/23 0428 07/06/23 0321  HGB 8.9* 9.1* 8.5* 8.0* 8.1*  HCT 29.1* 29.8* 27.6* 25.9* 25.7*    Hypokalemia: Replaced Recent Labs  Lab 07/02/23 1039 07/03/23 0525 07/04/23 0521 07/05/23 0428 07/06/23 0321  K 3.3* 3.9 3.5 2.9* 3.5    Hypertension: BP fairly controlled on PRNs   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 With colon mass and ABLA unable to continue Xarelto -discussed with surgery resumed hearin 1/27- duplex shoed age indeterminate dvt lefft diatsl axillay vein  COPD: Not in exacerbation  Tobacco abuse: Continue nicotine patch  DVT prophylaxis: SCDs Start: 07/01/23 2312 Code Status:   Code Status: Full Code Family Communication: plan of care discussed with patient/family at bedside. Patient status is: Remains hospitalized because of severity of illness Level of care: Med-Surg   Dispo: The patient is from: From home            Anticipated disposition: TBD Objective: Vitals last 24 hrs: Vitals:   07/05/23 0626 07/05/23 1447 07/05/23 2226 07/06/23 0539  BP: (!) 151/91 (!) 142/80 (!) 182/100 (!) 151/96  Pulse: 84 87 70 76  Resp: 16 17 17 18   Temp: 98.5 F (36.9 C) 98.4 F (36.9 C) 98.2 F (36.8 C) 98.8 F (37.1 C)  TempSrc: Oral Oral Oral Oral  SpO2: 92% 98% 97% 95%  Weight:      Height:       Weight change:  Physical Examination: General exam: alert awake, oriented at baseline, older than stated age HEENT:Oral mucosa moist, Ear/Nose WNL grossly Respiratory system: Bilaterally clear BS,no use of accessory muscle Cardiovascular system: S1 & S2 +, No JVD. Gastrointestinal system: Abdomen  soft,NT,ND, BS+ Nervous System: Alert, awake, moving all extremities,and following commands. Extremities: Left arm swollen,distal peripheral pulses palpable and warm.  Skin: No rashes,no icterus. MSK: Normal muscle bulk,tone, power   Medications reviewed:  Scheduled Meds:  pantoprazole (PROTONIX) IV  40 mg Intravenous Q12H   Continuous Infusions:   ceFAZolin (ANCEF) IV     heparin 1,200 Units/hr (07/06/23 0452)     Diet Order             Diet NPO time specified Except for: Sips with Meds  Diet effective midnight                   Intake/Output Summary (Last 24 hours) at 07/06/2023 0906 Last data filed at 07/06/2023 0650 Gross per 24 hour  Intake 1558.39 ml  Output --  Net 1558.39 ml   Net IO Since Admission: 6,795.34 mL [07/06/23 0906]  Wt Readings from Last 3 Encounters:  07/02/23 65.4 kg  06/14/23 65.4 kg  05/05/23 61.2 kg     Unresulted Labs (From admission, onward)     Start     Ordered   07/06/23 1300  Heparin level (unfractionated)  Once-Timed,   TIMED        07/06/23 0446   07/04/23 0500  CBC  Daily,   R      07/03/23 1143   07/03/23 0500  Basic metabolic panel  Daily,   R      07/02/23 1132   07/02/23 2000  Hemoglobin and hematocrit, blood  Now then every 8 hours,   R      07/02/23 1132           Data Reviewed: I have personally reviewed following labs and imaging studies CBC: Recent Labs  Lab 07/02/23 0118 07/02/23 1039 07/02/23 1211 07/03/23 1255 07/03/23 1941 07/04/23 0521 07/05/23 0428 07/06/23 0321  WBC 13.0* 12.7*  --   --   --  9.0 8.8 8.3  HGB 6.6* 8.9*   < > 8.9* 9.1* 8.5* 8.0* 8.1*  HCT 21.9* 28.1*   < > 29.1* 29.8* 27.6* 25.9* 25.7*  MCV 79.3* 81.4  --   --   --  80.9 80.2 80.3  PLT 730* 541*  --   --   --  475* 424* 417*   < > = values in this interval not displayed.   Basic Metabolic Panel:  Recent Labs  Lab 07/02/23 1039 07/03/23 0525 07/04/23 0521 07/05/23 0428 07/06/23 0321  NA 136 137 139 137 137  K 3.3* 3.9  3.5 2.9* 3.5  CL 106 104 105 103 105  CO2 22 25 27 27 26   GLUCOSE 110* 87 100* 93 92  BUN 20 17 15 11 9   CREATININE 0.69 0.70 0.52 0.75 0.54  CALCIUM 8.9 8.5* 8.9 8.5* 8.6*   GFR: Estimated Creatinine Clearance: 58.9 mL/min (by C-G formula based on SCr of 0.54 mg/dL). Liver Function Tests:  Recent Labs  Lab 07/01/23 1652 07/02/23 1039  AST 11* 15  ALT 10 13  ALKPHOS 87 73  BILITOT 0.3 0.6  PROT 6.0* 6.0*  ALBUMIN 3.8 2.9*   Recent Labs  Lab 07/01/23 1652  LIPASE <10*   No results for input(s): "AMMONIA" in the last 168 hours. Coagulation Profile:  Recent Labs  Lab 07/01/23 1743  INR 1.0  No results found for this or any previous visit (from the past 240 hours).  Antimicrobials/Microbiology: Anti-infectives (From admission, onward)    Start     Dose/Rate Route Frequency Ordered Stop   07/06/23 0945  ceFAZolin (ANCEF) IVPB 2g/100 mL premix        2 g 200 mL/hr over 30 Minutes Intravenous On call to O.R. 07/06/23 0853 07/07/23 0559      No results found for: "SDES", "SPECREQUEST", "CULT", "REPTSTATUS"   Radiology Studies: VAS [place] UPPER EXTREMITY VENOUS DUPLEX Result Date: 07/05/2023 UPPER VENOUS STUDY  Patient Name:  DUSTIN BURRILL  Date of Exam:   07/05/2023 Medical Rec #: [id number]               Accession #:    [id number] Date of Birth: Oct 11, 1950               Patient Gender: F Patient Age:   38 years Exam Location:  [facility] Procedure:      VAS [place] UPPER EXTREMITY VENOUS DUPLEX Referring Phys: Johnny Bridge [facility] --------------------------------------------------------------------------------  Indications: Swelling Anticoagulation: Xarelto (prior to admission). Comparison Study: Previous exam on 05/05/2023 was positive for DVT in LUE                   (subclavian & axillary) & SVT (basilic) Performing Technologist: Ernestene Mention RVT, RDMS  Examination Guidelines: A complete evaluation includes B-mode imaging, spectral Doppler, color Doppler, and power Doppler as  needed of all accessible portions of each vessel. Bilateral testing is considered an integral part of a complete examination. Limited examinations for reoccurring indications may be performed as noted.  Left Findings: +----------+------------+---------+-----------+----------+-----------------+ LEFT      CompressiblePhasicitySpontaneousProperties     Summary      +----------+------------+---------+-----------+----------+-----------------+ IJV           Full       Yes       Yes                                +----------+------------+---------+-----------+----------+-----------------+ Subclavian    Full       Yes       Yes                                +----------+------------+---------+-----------+----------+-----------------+ Axillary    Partial      Yes       Yes              Age Indeterminate +----------+------------+---------+-----------+----------+-----------------+ Brachial      Full       Yes       Yes                                +----------+------------+---------+-----------+----------+-----------------+ Radial        Full                                                    +----------+------------+---------+-----------+----------+-----------------+ Ulnar         Full                                                    +----------+------------+---------+-----------+----------+-----------------+  Cephalic      Full                                                    +----------+------------+---------+-----------+----------+-----------------+ Basilic       None       No        No               Age Indeterminate +----------+------------+---------+-----------+----------+-----------------+  Summary:  Right: No evidence of thrombosis in the subclavian.  Left: Findings consistent with age indeterminate deep vein thrombosis involving the left distal axillary vein. Findings consistent with age indeterminate superficial vein thrombosis involving the left  forearm portion of cephalic vein and left upper arm postion of basilic vein.  *See table(s) above for measurements and observations.  Diagnosing physician: Gerarda Fraction Electronically signed by Gerarda Fraction on 07/05/2023 at 5:18:03 PM.    Final       LOS: 5 days   Total time spent in review of labs and imaging, patient evaluation, formulation of plan, documentation and communication with family: 35 minutes  Lanae Boast, MD  [facility]  07/06/2023, 9:06 AM

## 2023-07-06 NOTE — Anesthesia Postprocedure Evaluation (Addendum)
Anesthesia Post Note  Patient: Tina Mccullough  Procedure(s) Performed: PARTIAL COLECTOMY (Right: Abdomen)     Patient location during evaluation: PACU Anesthesia Type: General Level of consciousness: awake and alert Pain management: pain level controlled Vital Signs Assessment: post-procedure vital signs reviewed and stable Respiratory status: spontaneous breathing, nonlabored ventilation, respiratory function stable and patient connected to nasal cannula oxygen Cardiovascular status: blood pressure returned to baseline and stable Postop Assessment: no apparent nausea or vomiting Anesthetic complications: no  No notable events documented.  Last Vitals:  Vitals:   07/06/23 1418 07/06/23 1430  BP:  (!) 162/90  Pulse: 88 87  Resp: 12 17  Temp:    SpO2: 95% 97%             Shelton Silvas

## 2023-07-06 NOTE — Progress Notes (Signed)
PHARMACY - ANTICOAGULATION CONSULT NOTE  Pharmacy Consult for Heparin Indication: UE DVT  Allergies  Allergen Reactions   Beef-Derived Drug Products Hives    Alpha-gal   Dog Epithelium (Canis Lupus Familiaris) Anaphylaxis   Pork-Derived Products Hives    Alpha-gal   Bee Venom Hives   Diclofenac Nausea And Vomiting   Lisinopril Other (See Comments)    Headaches    Patient Measurements: Height: 5\' 5"  (165.1 cm) Weight: 61.2 kg (135 lb) IBW/kg (Calculated) : 57 Heparin Dosing Weight: TBW  Vital Signs: Temp: 98.4 F (36.9 C) (01/28 1342) Temp Source: Oral (01/28 1014) BP: 162/90 (01/28 1430) Pulse Rate: 87 (01/28 1430)  Labs: Recent Labs    07/04/23 0521 07/05/23 0428 07/05/23 1152 07/06/23 0321  HGB 8.5* 8.0*  --  8.1*  HCT 27.6* 25.9*  --  25.7*  PLT 475* 424*  --  417*  HEPARINUNFRC  --   --  <0.10* <0.10*  CREATININE 0.52 0.75  --  0.54    Estimated Creatinine Clearance: 58.9 mL/min (by C-G formula based on SCr of 0.54 mg/dL).   Medical History: Past Medical History:  Diagnosis Date   Anxiety    Depression    DVT of axillary vein, acute left (HCC)    left subclavian vein   Hypertension    PONV (postoperative nausea and vomiting)    Pulmonary embolus (HCC)     Medications:  -Xarelto 20mg  daily PTA - last dose 06/30/23  Assessment:/ 73 yo F presented with abdominal pain and BRBPR on 1/23.  PMH includes Xarelto PTA for hx DVT/PE (04/2023) which was held on Admission.  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  New UE DVT noted on 1/27 and Pharmacy was consulted to start IV heparin without bolus.   Pork allergy noted.  She has hx alpha-gal and does not eat pork, however verified with patient that she tolerated heparin in November 2024 without any reaction and is ok to proceed with heparin as ordered.   07/06/23 Heparin level < 0.1 on heparin 1000 units/hr this morning and heparin was increased to 1200 units/hr.  Heparin was held  at 8am prior to next level. She underwent right hemicolectomy on 1/28 with AET 1347.  Hgb = 8.1 (stable), PLTC 417k No bleeding or complications reported in Op note.   Goal of Therapy:  Heparin level 0.3-0.7 units/ml Monitor platelets by anticoagulation protocol: Yes   Plan:  Restart IV heparin 1200 units/hr at 22:00 today per Dr. Fredricka Bonine.  No bolus due to recent procedure, bleeding.   Check heparin level 8 hr after start.   Daily heparin level & CBC while on heparin   Lynann Beaver PharmD, BCPS [facility] main pharmacy [phone] 07/06/2023 3:06 PM

## 2023-07-06 NOTE — Op Note (Signed)
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  Foley catheter was inserted which is removed at the end of the case.  The abdomen was prepped and draped in usual sterile fashion and a midline laparotomy was created.  An Alexis wound protector was placed.  The cecum and ascending colon were noted to be markedly distended and there were serosal tears along the cecum consistent with impending perforation.  The white line of Toldt was incised and the cecum and ascending colon were mobilized, following the plane between the mesentery and the retroperitoneum.  The duodenum was visualized and carefully protected.  The hepatic flexure was mobilized with a combination of blunt dissection and LigaSure.  A point along the mid transverse colon, several centimeters distal to the tattoo from the patient's colonoscopy and well distal to the mass was selected and confirmed to have excellent blood supply from branch of the middle colic.  This was transected with a blue load 75 mm Endo GIA stapler.  The terminal ileum was transected about 15cm proximal to the ileocecal valve again with a 75 mm Endo GIA stapler.  The mesentery was divided with the LigaSure.  The ileocolic pedicle was skeletonized, clamped, divided with LigaSure and then reinforced with a 2-0 silk suture ligature.  Hemostasis was excellent.  The specimen was handed  off and the abdominal cavity was irrigated with warm sterile saline.  The effluent was clear.  The transected terminal ileum and transverse colon were brought in alignment, and forming note twist in the mesentery.  Enterotomies were created with cautery and a side-to-side, functional end-to-end ileocolonic anastomosis was created with a 75 mm Endo GIA blue load.  The common enterotomy was closed with a TA 60 blue load.  The corners of this latter staple line were imbricated with 3-0 silk seromuscular sutures.  An additional 3-0 silk seromuscular suture was placed at the apex of the anastomotic staple line.  On completion the anastomosis appears well-perfused, viable, free of tension and palpably patent.  This was gently returned to the abdominal cavity and omentum brought over to lay on top of this.  The small bowel was run proximally and confirmed to be free of any other injury or abnormality.   There was no palpable tumor along the liver or peritoneal surfaces.  The abdominal cavity was irrigated once more with warm sterile saline and the effluent was clear.  At this point, clean/dirty protocol proceeded and all drapes, instruments, gowns and gloves were exchanged for clean wounds.  The fascia was closed with a running looped #1 PDS starting at either end and tying centrally.  Hemostasis was ensured and the soft tissues and the skin was then closed with staples followed by a honeycomb dressing.  The patient was then awakened, extubated and taken to PACU in stable condition.    All counts were correct at the completion of the case.

## 2023-07-06 NOTE — Plan of Care (Signed)

## 2023-07-06 NOTE — Progress Notes (Signed)
PHARMACY - ANTICOAGULATION CONSULT NOTE  Pharmacy Consult for Heparin Indication: UE DVT  Allergies  Allergen Reactions   Beef-Derived Drug Products Hives    Alpha-gal   Dog Epithelium (Canis Lupus Familiaris) Anaphylaxis   Pork-Derived Products Hives    Alpha-gal   Bee Venom Hives   Diclofenac Nausea And Vomiting   Lisinopril Other (See Comments)    Headaches    Patient Measurements: Height: 5\' 5"  (165.1 cm) Weight: 65.4 kg (144 lb 2.9 oz) IBW/kg (Calculated) : 57 Heparin Dosing Weight: TBW  Vital Signs: Temp: 98.2 F (36.8 C) (01/27 2226) Temp Source: Oral (01/27 2226) BP: 182/100 (01/27 2226) Pulse Rate: 70 (01/27 2226)  Labs: Recent Labs    07/04/23 0521 07/05/23 0428 07/05/23 1152 07/06/23 0321  HGB 8.5* 8.0*  --  8.1*  HCT 27.6* 25.9*  --  25.7*  PLT 475* 424*  --  417*  HEPARINUNFRC  --   --  <0.10* <0.10*  CREATININE 0.52 0.75  --  0.54    Estimated Creatinine Clearance: 58.9 mL/min (by C-G formula based on SCr of 0.54 mg/dL).   Medical History: Past Medical History:  Diagnosis Date   Anxiety    Depression    DVT of axillary vein, acute left (HCC)    left subclavian vein   Hypertension    PONV (postoperative nausea and vomiting)    Pulmonary embolus (HCC)     Medications:  -Xarelto 20mg  daily PTA - last dose 06/30/23  Assessment:/ 73 yo F on Xarelto PTA for hx DVT/PE (04/2023) presented with BRBPR.  She is s/p colonoscopy 11/26 which revealed obstructing mass.  Surgery is recommending partial colectomy.    Now + new UE DVT.  Pharmacy consulted to start IV heparin without bolus.   Pork allergy noted.  She has hx alpha-gal and does not eat pork, however verified with patient that she tolerated heparin in November 2024 without any reaction and is ok to proceed with heparin as ordered.   Baseline labs: Hg 8.0 (low, rectal bleeding noted); pltc 424- elevated Heparin level <0.1, scr 0.75  07/06/23 Heparin level < 0.1 (subtherapeutic) with  heparin gtt @ 1000 units/hr Hgb = 8.1 (stable), PLTC 417k No bleeding reported by RN No interruption in heparin infusion and no line issues per nursing  Goal of Therapy:  Heparin level 0.3-0.7 units/ml Monitor platelets by anticoagulation protocol: Yes   Plan:  Increase IV heparin to 1200 units/hr.  No bolus due to recent procedure. Check heparin level 8 hr after rate increase Daily heparin level & CBC while on heparin F/U plans for surgery-->surgery team will need to specify when to hold heparin prior to procedure if pt elects to proceed.  Maryellen Pile PharmD 07/06/2023,4:46 AM

## 2023-07-06 NOTE — Consult Note (Signed)
  Daily Progress Note   Patient Name: Algie Cales       Date: 07/06/2023 DOB: 29-Sep-1950  Age: 73 y.o. MRN#: [id number] Attending Physician: Lanae Boast, MD Primary Care Physician: [name], MD Admit Date: 07/01/2023 Length of Stay: 5 days  Discussed care with primary hospitalist and surgical team today. PMT received new consult to assist with determining pathways for care regarding pursuing surgery. As per discussion/EMR review this morning, patient has elected to proceed with surgery. Palliative medical team will hold off on attempting new consult completion at this time in light of this. PMT will follow along with EMR awaiting surgical/biopsy results to determine how to best assist moving forward and will plan to complete new consult at that time if appropriate. Please reach out if acute PMT needs arise before then. Thank you.   Alvester Morin, DO Palliative Care Provider PMT # [phone]

## 2023-07-06 NOTE — Anesthesia Preprocedure Evaluation (Addendum)
Anesthesia Evaluation  Patient identified by MRN, date of birth, ID band Patient awake    Reviewed: Allergy & Precautions, NPO status , Patient's Chart, lab work & pertinent test results  History of Anesthesia Complications (+) PONV and history of anesthetic complications  Airway Mallampati: I  TM Distance: >3 FB Neck ROM: Full    Dental  (+) Teeth Intact, Dental Advisory Given   Pulmonary COPD, Current Smoker and Patient abstained from smoking.   breath sounds clear to auscultation       Cardiovascular hypertension,  Rhythm:Regular Rate:Normal     Neuro/Psych  PSYCHIATRIC DISORDERS Anxiety Depression    negative neurological ROS     GI/Hepatic negative GI ROS,,,  Endo/Other  negative endocrine ROS    Renal/GU negative Renal ROS     Musculoskeletal   Abdominal   Peds  Hematology  (+) Blood dyscrasia, anemia   Anesthesia Other Findings   Reproductive/Obstetrics                             Anesthesia Physical Anesthesia Plan  ASA: 3  Anesthesia Plan: General   Post-op Pain Management: Tylenol PO (pre-op)* and Toradol IV (intra-op)*   Induction: Intravenous  PONV Risk Score and Plan: 4 or greater and Ondansetron, Dexamethasone and Treatment may vary due to age or medical condition  Airway Management Planned: Oral ETT  Additional Equipment: None  Intra-op Plan:   Post-operative Plan: Extubation in OR  Informed Consent: I have reviewed the patients History and Physical, chart, labs and discussed the procedure including the risks, benefits and alternatives for the proposed anesthesia with the patient or authorized representative who has indicated his/her understanding and acceptance.     Dental advisory given  Plan Discussed with: CRNA  Anesthesia Plan Comments:        Anesthesia Quick Evaluation

## 2023-07-06 NOTE — Anesthesia Procedure Notes (Signed)
Procedure Name: Intubation Date/Time: 07/06/2023 12:05 PM  Performed by: Sindy Guadeloupe, CRNAPre-anesthesia Checklist: Patient identified, Emergency Drugs available, Suction available, Patient being monitored and Timeout performed Patient Re-evaluated:Patient Re-evaluated prior to induction Oxygen Delivery Method: Circle system utilized Preoxygenation: Pre-oxygenation with 100% oxygen Induction Type: IV induction Ventilation: Mask ventilation without difficulty Laryngoscope Size: Mac and 4 Grade View: Grade I Tube type: Oral Tube size: 7.0 mm Number of attempts: 1 Airway Equipment and Method: Stylet Placement Confirmation: ETT inserted through vocal cords under direct vision, positive ETCO2 and breath sounds checked- equal and bilateral Secured at: 21 cm Tube secured with: Tape Dental Injury: Teeth and Oropharynx as per pre-operative assessment

## 2023-07-07 ENCOUNTER — Encounter (HOSPITAL_COMMUNITY): Payer: Self-pay | Admitting: Surgery

## 2023-07-07 DIAGNOSIS — K6389 Other specified diseases of intestine: Secondary | ICD-10-CM | POA: Diagnosis not present

## 2023-07-07 LAB — CBC
HCT: 28.1 % — ABNORMAL LOW (ref 36.0–46.0)
Hemoglobin: 8.7 g/dL — ABNORMAL LOW (ref 12.0–15.0)
MCH: 24.8 pg — ABNORMAL LOW (ref 26.0–34.0)
MCHC: 31 g/dL (ref 30.0–36.0)
MCV: 80.1 fL (ref 80.0–100.0)
Platelets: 427 10*3/uL — ABNORMAL HIGH (ref 150–400)
RBC: 3.51 MIL/uL — ABNORMAL LOW (ref 3.87–5.11)
RDW: 16.3 % — ABNORMAL HIGH (ref 11.5–15.5)
WBC: 20.8 10*3/uL — ABNORMAL HIGH (ref 4.0–10.5)
nRBC: 0 % (ref 0.0–0.2)

## 2023-07-07 LAB — BASIC METABOLIC PANEL
Anion gap: 9 (ref 5–15)
BUN: 11 mg/dL (ref 8–23)
CO2: 26 mmol/L (ref 22–32)
Calcium: 8.6 mg/dL — ABNORMAL LOW (ref 8.9–10.3)
Chloride: 102 mmol/L (ref 98–111)
Creatinine, Ser: 0.83 mg/dL (ref 0.44–1.00)
GFR, Estimated: >60 mL/min (ref >60)
Glucose, Bld: 105 mg/dL — ABNORMAL HIGH (ref 70–99)
Potassium: 3.4 mmol/L — ABNORMAL LOW (ref 3.5–5.1)
Sodium: 137 mmol/L (ref 135–145)

## 2023-07-07 LAB — HEPARIN LEVEL (UNFRACTIONATED)
Heparin Unfractionated: 0.24 [IU]/mL — ABNORMAL LOW (ref 0.30–0.70)
Heparin Unfractionated: <0.1 [IU]/mL — ABNORMAL LOW (ref 0.30–0.70)

## 2023-07-07 LAB — MAGNESIUM: Magnesium: 1.6 mg/dL — ABNORMAL LOW (ref 1.7–2.4)

## 2023-07-07 MED ORDER — PANTOPRAZOLE SODIUM 40 MG PO TBEC
40.0000 mg | DELAYED_RELEASE_TABLET | Freq: Every day | ORAL | Status: DC
  Administered 2023-07-08 – 2023-07-17 (×10): 40 mg via ORAL
  Filled 2023-07-07 (×10): qty 1

## 2023-07-07 MED ORDER — METHOCARBAMOL 500 MG PO TABS
500.0000 mg | ORAL_TABLET | Freq: Three times a day (TID) | ORAL | Status: DC
  Administered 2023-07-07 – 2023-07-10 (×11): 500 mg via ORAL
  Filled 2023-07-07 (×12): qty 1

## 2023-07-07 MED ORDER — OXYCODONE HCL 5 MG PO TABS
5.0000 mg | ORAL_TABLET | ORAL | Status: DC | PRN
Start: 2023-07-07 — End: 2023-07-11
  Administered 2023-07-07 – 2023-07-10 (×7): 10 mg via ORAL
  Filled 2023-07-07 (×7): qty 2

## 2023-07-07 MED ORDER — MAGNESIUM SULFATE 4 GM/100ML IV SOLN
4.0000 g | Freq: Once | INTRAVENOUS | Status: AC
  Administered 2023-07-07: 4 g via INTRAVENOUS
  Filled 2023-07-07: qty 100

## 2023-07-07 MED ORDER — POTASSIUM CHLORIDE 10 MEQ/100ML IV SOLN
10.0000 meq | INTRAVENOUS | Status: AC
  Administered 2023-07-07 (×6): 10 meq via INTRAVENOUS
  Filled 2023-07-07 (×5): qty 100

## 2023-07-07 NOTE — Plan of Care (Signed)
Problem: Activity: Goal: Risk for activity intolerance will decrease Outcome: Progressing   Problem: Nutrition: Goal: Adequate nutrition will be maintained Outcome: Progressing   Problem: Coping: Goal: Level of anxiety will decrease Outcome: Progressing

## 2023-07-07 NOTE — Progress Notes (Signed)
PHARMACY - ANTICOAGULATION CONSULT NOTE  Pharmacy Consult for Heparin Indication: UE DVT  Allergies  Allergen Reactions   Beef-Derived Drug Products Hives    Alpha-gal   Dog Epithelium (Canis Lupus Familiaris) Anaphylaxis   Pork-Derived Products Hives    Alpha-gal   Bee Venom Hives   Diclofenac Nausea And Vomiting   Lisinopril Other (See Comments)    Headaches    Patient Measurements: Height: 5\' 5"  (165.1 cm) Weight: 61.2 kg (135 lb) IBW/kg (Calculated) : 57 Heparin Dosing Weight: TBW  Vital Signs: Temp: 97.9 F (36.6 C) (01/29 1241) BP: 129/72 (01/29 1241) Pulse Rate: 87 (01/29 1241)  Labs: Recent Labs    07/05/23 0428 07/05/23 1152 07/06/23 0321 07/07/23 0533 07/07/23 1438  HGB 8.0*  --  8.1* 8.7*  --   HCT 25.9*  --  25.7* 28.1*  --   PLT 424*  --  417* 427*  --   HEPARINUNFRC  --    < > <0.10* 0.24* <0.10*  CREATININE 0.75  --  0.54 0.83  --    < > = values in this interval not displayed.    Estimated Creatinine Clearance: 56.8 mL/min (by C-G formula based on SCr of 0.83 mg/dL).   Medical History: Past Medical History:  Diagnosis Date   Anxiety    Depression    DVT of axillary vein, acute left (HCC)    left subclavian vein   Hypertension    PONV (postoperative nausea and vomiting)    Pulmonary embolus (HCC)     Medications:  -Xarelto 20mg  daily PTA - last dose 06/30/23  Assessment:/ 73 yo F presented with abdominal pain and BRBPR on 1/23.  PMH includes Xarelto PTA for hx DVT/PE (04/2023) which was held on Admission.  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  New UE DVT noted on 1/27 and Pharmacy was consulted to start IV heparin without bolus.   Pork allergy noted.  She has hx alpha-gal and does not eat pork, however verified with patient that she tolerated heparin in November 2024 without any reaction and is ok to proceed with heparin as ordered.   1/28: OR for right hemicolectomy  07/07/23 Heparin level < 0.1  (subtherapeutic) on heparin 1300 units/hr (decreased despite rate increase)   CBC stable RN reports no interruptions in therapy, no line issues; heparin infusing appropriately; no bleeding at this time  Goal of Therapy:  Heparin level 0.3-0.7 units/ml Monitor platelets by anticoagulation protocol: Yes   Plan:  -Increase heparin infusion to 1450 units/hr -No bolus due to recent procedure, bleeding   -Check heparin level 8 hr after rate increase   -Daily heparin level & CBC while on heparin   Pricilla Riffle, PharmD, BCPS Clinical Pharmacist 07/07/2023 3:52 PM

## 2023-07-07 NOTE — Plan of Care (Signed)

## 2023-07-07 NOTE — Progress Notes (Signed)
Colonic mass  Subjective: Feeling very sore this morning but has mobilized well starting yesterday. Tolerating clears with mild distension but passing flatus without nausea. No BM.   Multiple family members at bedside  Objective: Vital signs in last 24 hours: Temp:  [97.8 F (36.6 C)-98.8 F (37.1 C)] 97.9 F (36.6 C) (01/29 0859) Pulse Rate:  [81-106] 91 (01/29 0859) Resp:  [8-20] 18 (01/29 0859) BP: (138-169)/(78-117) 143/78 (01/29 0859) SpO2:  [89 %-99 %] 94 % (01/29 0859) Weight:  [61.2 kg] 61.2 kg (01/28 1022) Last BM Date : 07/06/23  Intake/Output from previous day: 01/28 0701 - 01/29 0700 In: 2346.6 [P.O.:960; I.V.:936.6; IV Piggyback:450] Out: 1675 [Urine:1625; Blood:50] Intake/Output this shift: Total I/O In: 400 [P.O.:400] Out: -   General appearance: alert and cooperative Resp: unlabored respirations and pulling >1000 on IS GI: soft, mild distension mild TTP RLQ and along incision which is covered with honeycomb dressing with scant bloody drainage and staples intact  Lab Results:  Results for orders placed or performed during the hospital encounter of 07/01/23 (from the past 24 hours)  Basic metabolic panel     Status: Abnormal   Collection Time: 07/07/23  5:33 AM  Result Value Ref Range   Sodium 137 135 - 145 mmol/L   Potassium 3.4 (L) 3.5 - 5.1 mmol/L   Chloride 102 98 - 111 mmol/L   CO2 26 22 - 32 mmol/L   Glucose, Bld 105 (H) 70 - 99 mg/dL   BUN 11 8 - 23 mg/dL   Creatinine, Ser 4.09 0.44 - 1.00 mg/dL   Calcium 8.6 (L) 8.9 - 10.3 mg/dL   GFR, Estimated >81 >19 mL/min   Anion gap 9 5 - 15  Magnesium     Status: Abnormal   Collection Time: 07/07/23  5:33 AM  Result Value Ref Range   Magnesium 1.6 (L) 1.7 - 2.4 mg/dL  Heparin level (unfractionated)     Status: Abnormal   Collection Time: 07/07/23  5:33 AM  Result Value Ref Range   Heparin Unfractionated 0.24 (L) 0.30 - 0.70 IU/mL  CBC     Status: Abnormal   Collection Time: 07/07/23  5:33 AM   Result Value Ref Range   WBC 20.8 (H) 4.0 - 10.5 K/uL   RBC 3.51 (L) 3.87 - 5.11 MIL/uL   Hemoglobin 8.7 (L) 12.0 - 15.0 g/dL   HCT 14.7 (L) 82.9 - 56.2 %   MCV 80.1 80.0 - 100.0 fL   MCH 24.8 (L) 26.0 - 34.0 pg   MCHC 31.0 30.0 - 36.0 g/dL   RDW 13.0 (H) 86.5 - 78.4 %   Platelets 427 (H) 150 - 400 K/uL   nRBC 0.0 0.0 - 0.2 %     Studies/Results Radiology     MEDS, Scheduled  acetaminophen  1,000 mg Oral Q6H   gabapentin  300 mg Oral TID   pantoprazole (PROTONIX) IV  40 mg Intravenous Q12H     Assessment/Plan: 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  POD1 s/p open R hemicolectomy 1/28 Dr. Fredricka Bonine  S/p c scope 1/26 Dr. Doretha Imus - Biopsy with adenocarcinoma CEA elevated to 71.3, CT CH without met disease - surgical path pending - afebrile, WBC 20.8 from 8.3 postop. Trend. Hold off on further abx at this time and trend - tolerating clears with flatus. Possible advancement today - hgb 8.7 from 8.1 on hep gtt - encouraged mobilization and IS - oncology has been consulted - hypokalemia and hypomagnesemia -  replete  FEN: CLD ID: [id number] periop VTE: hep gtt  HTN COPD tobacco use LUE dvt - on xarelto op. Hep gtt   LOS: 6 days   Eric Form, [facility] Surgery 07/07/2023, 9:14 AM Please see Amion for pager number during day hours 7:00am-4:30pm

## 2023-07-07 NOTE — Progress Notes (Signed)
- PROGRESS NOTE Tina Mccullough  LKG:[id number] DOB: 13-Aug-1950 DOA: 07/01/2023 PCP: [name], MD  Brief Narrative/Hospital Course: 73 year old female history of lymphocytic colitis, COPD DVT PE hypertension recent admission on 11/28 for fall and DVT presented to the [facility] with 4 days of purple stool then some blood, abdominal pain on right side and had outpatient CT scan done that showed a mass and potentially some colitis and sent to the ED In the ED hemodynamically stable-initially tachycardic 117, labs with hypokalemia severe anemia hemoglobin 7.1> 6.3 g and admission was requested to unit PRBC was ordered on admission. CT scan abdomen with contrast 1/23: Obstructing masslike lesion in hepatic flexure suspicious for colon carcinoma dilated ascending colon with mild wall thickening suspicious for colitis mild right colonic wall thickening. Patient had difficulty tolerating colon prep underwent colonoscopy 07/04/23 with poor prep showed large obstructing and infiltrative mass in the proximal transverse colon/hepatic flexure but able to advance the scope beyond this area biopsies taken. Biopsy showed adenocarcinoma.  1/28 underwent right hemicolectomy.   Subjective: Patient seen and examined this morning She is doing well postop. Ambulating.  And taking clears. 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 S.p 2 units PRBC-Hb stable so far. CT chest no evidence of  metastasis. GI surgery following  colonoscopy 07/04/23 with poor prep showed large obstructing and infiltrative mass in the proximal transverse colon/hepatic flexure but unable to advance the scopeBiopsy showed adenocarcinoma.,1/28 underwent right hemicolectomy. Continue postop care per [facility], will involve oncology-Dr. 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  Patient back on heparin postop, monitor closely, DOAC one able . 1/27- duplex shoed age indeterminate dvt lefft diatsl axillay vein  COPD: Not in exacerbation  Tobacco abuse: Continue nicotine patch  DVT prophylaxis: SCDs Start: 07/01/23 2312 Code Status:   Code Status: Full Code Family Communication: plan of care discussed with patient/family at bedside. Patient status is: Remains hospitalized because of severity of illness Level of care: Med-Surg   Dispo: The patient is from: From home            Anticipated disposition: TBD Objective: Vitals last 24 hrs: Vitals:   07/06/23 2047 07/07/23 0144 07/07/23 0503 07/07/23 0859  BP: (!) 148/86 138/87 (!) 150/84 (!) 143/78  Pulse: (!) 106 85 91 91  Resp: 18 18 20 18   Temp: 98.8 F (37.1 C) 97.9 F (36.6 C) 97.8 F (36.6 C) 97.9 F (36.6 C)  TempSrc: Oral     SpO2: 95% 95% 94% 94%  Weight:      Height:       Weight change:   Physical Examination: General exam: alert awake, oriented at baseline,  older than stated age HEENT:Oral mucosa moist, Ear/Nose WNL grossly Respiratory system: Bilaterally clear BS,no use of accessory muscle Cardiovascular system: S1 & S2 +, No JVD. Gastrointestinal system: Abdomen soft, honeycomb  dressing in place in the abdomen , BS mild Nervous System: Alert, awake, moving all extremities,and following commands. Extremities: LE edema neg,distal peripheral pulses palpable and warm.  Skin: No rashes,no icterus. MSK: Normal muscle bulk,tone, power   Medications reviewed:  Scheduled Meds:  acetaminophen  1,000 mg Oral Q6H   gabapentin  300 mg Oral TID   [START ON 07/08/2023] pantoprazole  40 mg Oral Daily   Continuous Infusions:  heparin 1,300 Units/hr (07/07/23 1914)   potassium chloride 10 mEq (07/07/23 0916)     Diet Order             Diet clear liquid Fluid consistency: Thin  Diet effective now                   Intake/Output Summary (Last 24 hours) at 07/07/2023 1004 Last data filed at 07/07/2023 0756 Gross per 24 hour  Intake 2746.58 ml  Output 1675 ml  Net 1071.58 ml   Net IO Since Admission: 7,866.92 mL [07/07/23 1004]  Wt Readings from Last 3 Encounters:  07/06/23 61.2 kg  06/14/23 65.4 kg  05/05/23 61.2 kg     Unresulted Labs (From admission, onward)     Start     Ordered   07/07/23 1430  Heparin level (unfractionated)  Once-Timed,   TIMED        07/07/23 0621   07/07/23 0600  CBC  Daily,   R      07/06/23 1516           Data Reviewed: I have personally reviewed following labs and imaging studies CBC: Recent Labs  Lab 07/02/23 1039 07/02/23 1211 07/03/23 1941 07/04/23 0521 07/05/23 0428 07/06/23 0321 07/07/23 0533  WBC 12.7*  --   --  9.0 8.8 8.3 20.8*  HGB 8.9*   < > 9.1* 8.5* 8.0* 8.1* 8.7*  HCT 28.1*   < > 29.8* 27.6* 25.9* 25.7* 28.1*  MCV 81.4  --   --  80.9 80.2 80.3 80.1  PLT 541*  --   --  475* 424* 417* 427*   < > = values in this interval not displayed.   Basic Metabolic Panel:  Recent Labs  Lab 07/03/23 0525 07/04/23 0521 07/05/23 0428 07/06/23 0321 07/07/23 0533  NA 137 139 137 137 137  K 3.9 3.5 2.9* 3.5 3.4*  CL 104 105 103 105 102  CO2 25 27 27 26 26   GLUCOSE 87 100* 93 92 105*  BUN 17 15 11 9 11    CREATININE 0.70 0.52 0.75 0.54 0.83  CALCIUM 8.5* 8.9 8.5* 8.6* 8.6*  MG  --   --   --   --  1.6*   GFR: Estimated Creatinine Clearance: 56.8 mL/min (by C-G formula based on SCr of 0.83 mg/dL). Liver Function Tests:  Recent Labs  Lab 07/01/23 1652 07/02/23 1039  AST 11* 15  ALT 10 13  ALKPHOS 87 73  BILITOT 0.3 0.6  PROT 6.0* 6.0*  ALBUMIN 3.8 2.9*   Recent Labs  Lab 07/01/23 1652  LIPASE <10*   No results for input(s): "AMMONIA" in the last 168 hours. Coagulation Profile:  Recent Labs  Lab 07/01/23 1743  INR 1.0  No results found for this or any previous visit (from the past 240 hours).  Antimicrobials/Microbiology: Anti-infectives (From admission, onward)  Start     Dose/Rate Route Frequency Ordered Stop   07/06/23 0945  ceFAZolin (ANCEF) IVPB 2g/100 mL premix        2 g 200 mL/hr over 30 Minutes Intravenous On call to O.R. 07/06/23 0853 07/06/23 1221      No results found for: "SDES", "SPECREQUEST", "CULT", "REPTSTATUS"   Radiology Studies: VAS [place] UPPER EXTREMITY VENOUS DUPLEX Result Date: 07/05/2023 UPPER VENOUS STUDY  Patient Name:  Tina Mccullough  Date of Exam:   07/05/2023 Medical Rec #: [id number]               Accession #:    [id number] Date of Birth: [birth year]               Patient Gender: F Patient Age:   61 years Exam Location:  [facility] Procedure:      VAS [place] UPPER EXTREMITY VENOUS DUPLEX Referring Phys: [facility] --------------------------------------------------------------------------------  Indications: Swelling Anticoagulation: Xarelto (prior to admission). Comparison Study: Previous exam on 05/05/2023 was positive for DVT in LUE                   (subclavian & axillary) & SVT (basilic) Performing Technologist: Ernestene Mention RVT, RDMS  Examination Guidelines: A complete evaluation includes B-mode imaging, spectral Doppler, color Doppler, and power Doppler as needed of all accessible portions of each vessel. Bilateral testing is  considered an integral part of a complete examination. Limited examinations for reoccurring indications may be performed as noted.  Left Findings: +----------+------------+---------+-----------+----------+-----------------+ LEFT      CompressiblePhasicitySpontaneousProperties     Summary      +----------+------------+---------+-----------+----------+-----------------+ IJV           Full       Yes       Yes                                +----------+------------+---------+-----------+----------+-----------------+ Subclavian    Full       Yes       Yes                                +----------+------------+---------+-----------+----------+-----------------+ Axillary    Partial      Yes       Yes              Age Indeterminate +----------+------------+---------+-----------+----------+-----------------+ Brachial      Full       Yes       Yes                                +----------+------------+---------+-----------+----------+-----------------+ Radial        Full                                                    +----------+------------+---------+-----------+----------+-----------------+ Ulnar         Full                                                    +----------+------------+---------+-----------+----------+-----------------+ Cephalic  Full                                                    +----------+------------+---------+-----------+----------+-----------------+ Basilic       None       No        No               Age Indeterminate +----------+------------+---------+-----------+----------+-----------------+  Summary:  Right: No evidence of thrombosis in the subclavian.  Left: Findings consistent with age indeterminate deep vein thrombosis involving the left distal axillary vein. Findings consistent with age indeterminate superficial vein thrombosis involving the left forearm portion of cephalic vein and left upper arm postion of basilic  vein.  *See table(s) above for measurements and observations.  Diagnosing physician: Gerarda Fraction Electronically signed by Gerarda Fraction on 07/05/2023 at 5:18:03 PM.    Final       LOS: 6 days   Total time spent in review of labs and imaging, patient evaluation, formulation of plan, documentation and communication with family: 35 minutes  Lanae Boast, MD  [facility]  07/07/2023, 10:04 AM

## 2023-07-07 NOTE — Progress Notes (Signed)
PHARMACY - ANTICOAGULATION CONSULT NOTE  Pharmacy Consult for Heparin Indication: UE DVT  Allergies  Allergen Reactions   Beef-Derived Drug Products Hives    Alpha-gal   Dog Epithelium (Canis Lupus Familiaris) Anaphylaxis   Pork-Derived Products Hives    Alpha-gal   Bee Venom Hives   Diclofenac Nausea And Vomiting   Lisinopril Other (See Comments)    Headaches    Patient Measurements: Height: 5\' 5"  (165.1 cm) Weight: 61.2 kg (135 lb) IBW/kg (Calculated) : 57 Heparin Dosing Weight: TBW  Vital Signs: Temp: 97.8 F (36.6 C) (01/29 0503) Temp Source: Oral (01/28 2047) BP: 150/84 (01/29 0503) Pulse Rate: 91 (01/29 0503)  Labs: Recent Labs    07/05/23 0428 07/05/23 1152 07/06/23 0321 07/07/23 0533  HGB 8.0*  --  8.1* 8.7*  HCT 25.9*  --  25.7* 28.1*  PLT 424*  --  417* 427*  HEPARINUNFRC  --  <0.10* <0.10* 0.24*  CREATININE 0.75  --  0.54 0.83    Estimated Creatinine Clearance: 56.8 mL/min (by C-G formula based on SCr of 0.83 mg/dL).   Medical History: Past Medical History:  Diagnosis Date   Anxiety    Depression    DVT of axillary vein, acute left (HCC)    left subclavian vein   Hypertension    PONV (postoperative nausea and vomiting)    Pulmonary embolus (HCC)     Medications:  -Xarelto 20mg  daily PTA - last dose 06/30/23  Assessment:/ 73 yo F presented with abdominal pain and BRBPR on 1/23.  PMH includes Xarelto PTA for hx DVT/PE (04/2023) which was held on Admission.  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  New UE DVT noted on 1/27 and Pharmacy was consulted to start IV heparin without bolus.   Pork allergy noted.  She has hx alpha-gal and does not eat pork, however verified with patient that she tolerated heparin in November 2024 without any reaction and is ok to proceed with heparin as ordered.   1/28: OR for right hemicolectomy  07/07/23 Heparin level = 0.24 (subtherapeutic) on heparin 1200 units/hr  Hgb = 8.7 (stable),  PLTC 427k No bleeding or complications reported by RN  Goal of Therapy:  Heparin level 0.3-0.7 units/ml Monitor platelets by anticoagulation protocol: Yes   Plan:  Increase IV heparin to 1300 units/hr No bolus due to recent procedure, bleeding.   Check heparin level 8 hr after rate increase   Daily heparin level & CBC while on heparin   Terrilee Files, PharmD 07/07/2023 6:15 AM

## 2023-07-07 NOTE — Consult Note (Cosign Needed)
[facility] CONSULT NOTE  Patient Care Team: [name], MD as PCP - General (Family Medicine) Janalyn Harder, MD (Inactive) as Consulting Physician (Dermatology)  CHIEF COMPLAINTS/PURPOSE OF CONSULTATION:  Adenocarcinoma status post colectomy  REFERRING PHYSICIAN:  HISTORY OF PRESENTING ILLNESS:  Tina Mccullough 73 y.o. female this is a very pleasant female patient who went to [facility] on 07/01/2023 due to weakness, abdominal pain, and bloody stools x 4 days.  Patient saw her primary physician for complaints as noted and was sent to have an outpatient CT scan.  That scan showed a mass with colitis.  Also was noted a drop in hemoglobin, which was concerning for colon cancer.   Workup in the ED included CT scans which showed obstructing masslike lesion in hepatic flexure of colon highly suspicious for colon carcinoma. Therefore oncology consult has been notified.  Of note patient had seen hematology earlier in January due to DVT and was seen by Dr. Cherly Hensen.  Patient seen and assessed today.  She is awake alert and oriented x 3.  Multiple family members including her brother at bedside.  Patient confirms reason for seeking medical care as noted above.  She admits that about 1 month ago she felt  "food was sitting in her stomach".  She became very concerned with the bloody stools x 4 days.  Therefore she followed up with her primary physician for further evaluation. Medical history as stated significant for DVT in November 2024 status post mechanical fall and was started on Xarelto, hypertension, COPD, anxiety and depression. Surgical history includes bilateral hands and left knee surgeries.  Also C-section several years ago. Family history includes father with throat, lung and prostate cancer.  Mother with unknown cancer with mets to bone.  Patient's brother also has prostate cancer. Social history significant for tobacco use beginning in her early 70s, quit November  2024, less than 1 pack/day.  Denies alcohol use.  Denies illicit or recreational drug use.  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   I have reviewed her chart and materials related to her cancer extensively and collaborated history with the patient. Summary of oncologic history is as follows: Oncology History   No history exists.    ASSESSMENT & PLAN:  1.  Right colon mass-adenocarcinoma - CT scan abdomen/pelvis done 07/01/2023 showed obstructing masslike lesion in hepatic flexure colon highly suspicious for colon carcinoma.  Local lymph node mets cannot be excluded. - CT of chest showed no evidence of distant metastatic disease. - Status post colonoscopy on 07/04/2023 which also showed large obstructing and infiltrative mass in the proximal transverse colon.  Pathology from biopsy of transverse colon showed adenocarcinoma, superficial fragments.  No submucosa present to evaluate for submucosal invasion. - Seen by surgery.  Colectomy was done on 07/06/2023.  Pathology remains pending. - Tumor markers elevated: CEA 71.30.  CA 19-9 111. - Medical oncology will make treatment and management recommendations pending final pathology.  2.  History of DVT -Patient seen 06/14/2023 outpatient hematology clinic for DVT.  Seen by Dr. Cherly Hensen. - Provoked DVT, s/p fall in November 2024 which resulted in rib fractures. - Was started on Xarelto 20 mg p.o. daily.  On hold at this time. - Continue IV heparin infusion per protocol - Monitor closely for active bleeding  3.  Acute blood loss anemia Lower GI bleed -Patient c/o 4 days of bloody stools prior to admission - Likely multifactorial due to malignancy, lower GI bleed - Hemoglobin 8.7 today.  No transfusional requirements at this time. - Transfuse PRBC for Hgb <7.0. - Monitor CBC with differential closely  4.  Pneumonia - Seen on CT of chest 07/02/2023 - Continue antibiotics as ordered - Monitor fever curve    Orders  Placed This Encounter  Procedures   COLONOSCOPY    Standing Status:   Standing    Number of Occurrences:   1   CT CHEST W CONTRAST    Standing Status:   Standing    Number of Occurrences:   1    Does the patient have a contrast media/X-ray dye allergy?:   No    If indicated for the ordered procedure, I authorize the administration of contrast media per Radiology protocol:   Yes   Lipase, blood    Standing Status:   Standing    Number of Occurrences:   1   Comprehensive metabolic panel    Standing Status:   Standing    Number of Occurrences:   1   CBC    Standing Status:   Standing    Number of Occurrences:   1   Urinalysis, Routine w reflex microscopic -Urine, Clean Catch    Standing Status:   Standing    Number of Occurrences:   1    Specimen Source:   Urine, Clean Catch [76]    Release to patient:   Immediate   Cancer antigen 19-9    Standing Status:   Standing    Number of Occurrences:   1   Protime-INR    Standing Status:   Standing    Number of Occurrences:   1   CEA (Access)    Standing Status:   Standing    Number of Occurrences:   1   Hemoglobin and hematocrit, blood    Standing Status:   Standing    Number of Occurrences:   1   Comprehensive metabolic panel    Standing Status:   Standing    Number of Occurrences:   1   Hemoglobin and hematocrit, blood    Standing Status:   Standing    Number of Occurrences:   1   Hemoglobin and hematocrit, blood    Standing Status:   Standing    Number of Occurrences:   6   Basic metabolic panel    Standing Status:   Standing    Number of Occurrences:   5   Heparin level (unfractionated)    Standing Status:   Standing    Number of Occurrences:   1   Heparin level (unfractionated)    Standing Status:   Standing    Number of Occurrences:   1   Magnesium    Standing Status:   Standing    Number of Occurrences:   1   Heparin level (unfractionated)    Standing Status:   Standing    Number of Occurrences:   1   CBC     Standing Status:   Standing    Number of Occurrences:   10   Heparin level (unfractionated)    Standing Status:   Standing    Number of Occurrences:   1   Diet clear liquid Fluid consistency: Thin    Standing Status:   Standing    Number of Occurrences:   1    Fluid consistency::   Thin   Place X2 Large Bore IV's    Standing Status:   Standing    Number of Occurrences:   1  Re-check Vital Signs    Standing Status:   Standing    Number of Occurrences:   1   Vital signs    Standing Status:   Standing    Number of Occurrences:   1   Notify physician (specify)    Standing Status:   Standing    Number of Occurrences:   20    Notify Physician:   for pulse less than 55 or greater than 120    Notify Physician:   for respiratory rate less than 12 or greater than 25    Notify Physician:   for temperature greater than 100.5 F    Notify Physician:   for urinary output less than 30 mL/hr for four hours    Notify Physician:   for systolic BP less than 90 or greater than 160, diastolic BP less than 60 or greater than 100    Notify Physician:   for new hypoxia w/ oxygen saturations < 88%   Mobility Protocol: No Restrictions RN to initiate protocols based on patient's level of care    RN to initiate protocols based on patient's level of care    Standing Status:   Standing    Number of Occurrences:   1   Refer to Sidebar Report Refer to ICU, Med-Surg, Progressive, and Step-Down Mobility Protocol Sidebars    Refer to ICU, Med-Surg, Progressive, and Step-Down Mobility Protocol Sidebars    Standing Status:   Standing    Number of Occurrences:   1   Initiate Adult Central Line Maintenance and Catheter Protocol for patients with central line (CVC, PICC, Port, Hemodialysis, Trialysis)    Standing Status:   Standing    Number of Occurrences:   1   Do not place and if present remove PureWick    Standing Status:   Standing    Number of Occurrences:   1   Initiate Oral Care Protocol    Standing  Status:   Standing    Number of Occurrences:   1   Initiate Carrier Fluid Protocol    Standing Status:   Standing    Number of Occurrences:   1   RN may order General Admission PRN Orders utilizing "General Admission PRN medications" (through manage orders) for the following patient needs: allergy symptoms (Claritin), cold sores (Carmex), cough (Robitussin DM), eye irritation (Liquifilm Tears), hemorrhoids (Tucks), indigestion (Maalox), minor skin irritation (Hydrocortisone Cream), muscle pain (Ben Gay), nose irritation (saline nasal spray) and sore throat (Chloraseptic spray).    Standing Status:   Standing    Number of Occurrences:   [phone]   SCDs    Standing Status:   Standing    Number of Occurrences:   1    Laterality:   Bilateral   Patient has an active order for admit to inpatient/place in observation    Standing Status:   Standing    Number of Occurrences:   1   Informed Consent Details: Physician/Practitioner Attestation; Transcribe to consent form and obtain patient signature    Standing Status:   Standing    Number of Occurrences:   1    Physician/Practitioner attestation of informed consent for blood and or blood product transfusion:   I, the physician/practitioner, attest that I have discussed with the patient the benefits, risks, side effects, alternatives, likelihood of achieving goals and potential problems during recovery for the procedure that I have provided informed consent.    Product(s):   All Product(s)   Pre-admission testing diagnosis  Standing Status:   Standing    Number of Occurrences:   1    Diagnosis:   Encounter for other preprocedural examination [Z01.818]   Pre-admission testing diagnosis    Standing Status:   Standing    Number of Occurrences:   1    Diagnosis:   Encounter for other preprocedural examination [Z01.818]   Informed Consent Details: Physician/Practitioner Attestation; Transcribe to consent form and obtain patient signature    Standing  Status:   Standing    Number of Occurrences:   1    Physician/Practitioner attestation of informed consent for procedure/surgical case:   I, the physician/practitioner, attest that I have discussed with the patient the benefits, risks, side effects, alternatives, likelihood of achieving goals and potential problems during recovery for the procedure that I have provided informed consent.    Procedure:   open right colectomy    Physician/Practitioner performing the procedure:   connor    Indication/Reason:   obstructing/bleeding mass   Provider attestation of informed consent for procedure/surgical case    I, the ordering provider, attest that I have discussed with the patient the benefits, risks, side effects, alternatives, likelihood of achieving goals and potential problems during recovery for the procedure that I have provided informed consent.    Standing Status:   Standing    Number of Occurrences:   1   Full code    Standing Status:   Standing    Number of Occurrences:   1    By::   Consent: discussion documented in EHR   Consult to hospitalist    Standing Status:   Standing    Number of Occurrences:   1    Place call to::   [facility]    Reason for Consult:   Admit   Medication reconciliation pending pharmacist review    Standing Status:   Standing    Number of Occurrences:   1    Comment::   PTA Xarelto   Consult to palliative care    Standing Status:   Standing    Number of Occurrences:   1    Palliative Care Consult Services:   Palliative Medicine Consult    Reason for Consult?:   GOC, code status, surgical goals   heparin per pharmacy consult    Standing Status:   Standing    Number of Occurrences:   1    Indication::   VTE Treatment    Comment::   no bolus due to rectal bleed- ok to resume at 10pm on 07/06/23   Oxygen therapy Mode or (Route): Nasal cannula; Liters Per Minute: 2; Keep O2 saturation between: greater than 92 %    Standing Status:   Standing    Number  of Occurrences:   1    Mode or (Route):   Nasal cannula    Liters Per Minute:   2    Keep O2 saturation between:   greater than 92 %   EKG    Standing Status:   Standing    Number of Occurrences:   1   EKG    Standing Status:   Standing    Number of Occurrences:   1   EKG 12-Lead    Standing Status:   Standing    Number of Occurrences:   1   Prepare RBC (crossmatch)    Standing Status:   Standing    Number of Occurrences:   1    # of Units:   2  units    Transfusion Indications:   Hemoglobin < 7 gm/dL and symptomatic    Number of Units to Keep Ahead:   NO units ahead    If emergent release call blood bank:   [facility] [phone]   Type and screen [facility]    [facility]     Standing Status:   Standing    Number of Occurrences:   1   ABO/Rh    Standing Status:   Standing    Number of Occurrences:   1   Type and screen [facility]    [facility]     Standing Status:   Standing    Number of Occurrences:   1   Admit to Inpatient (patient's expected length of stay will be greater than 2 midnights or inpatient only procedure)    Standing Status:   Standing    Number of Occurrences:   1    Hospital Area:   [facility] [[id number]]    Level of Care:   Med-Surg [16]    May admit patient to [facility] or [facility] if equivalent level of care is available::   Yes    Interfacility transfer:   Yes    Covid Evaluation:   Asymptomatic - no recent exposure (last 10 days) testing not required    Diagnosis:   Colonic mass [098119]    Admitting Physician:   Rometta Emery [[id number]]    Attending Physician:   Rometta Emery [[id number]]    Certification::   I certify this patient will need inpatient services for at least 2 midnights    Expected Medical Readiness:   07/05/2023     MEDICAL HISTORY:  Past Medical History:  Diagnosis Date   Anxiety    Depression    DVT of axillary vein, acute  left (HCC)    left subclavian vein   Hypertension    PONV (postoperative nausea and vomiting)    Pulmonary embolus (HCC)     SURGICAL HISTORY: Past Surgical History:  Procedure Laterality Date   ARTHROSCOPIC REPAIR ACL Left    BIOPSY  07/04/2023   Procedure: BIOPSY;  Surgeon: Kathi Der, MD;  Location: [facility];  Service: Gastroenterology;;   CESAREAN SECTION     COLONOSCOPY WITH PROPOFOL N/A 07/04/2023   Procedure: COLONOSCOPY WITH PROPOFOL;  Surgeon: Kathi Der, MD;  Location: [facility];  Service: Gastroenterology;  Laterality: N/A;   EXCISION ORAL TUMOR Left 11/28/2014   Procedure: SURGICAL REMOVAL OF ODOTOGENIC  TUMOR AND IMPACTED TOOTH NUMBER 17;  Surgeon: Felton Clinton, DDS;  Location: [facility];  Service: Oral Surgery;  Laterality: Left;   HAND SURGERY Bilateral    for trigger fingers   PARTIAL COLECTOMY Right 07/06/2023   Procedure: PARTIAL COLECTOMY;  Surgeon: Berna Bue, MD;  Location: [facility];  Service: General;  Laterality: Right;   SUBMUCOSAL TATTOO INJECTION  07/04/2023   Procedure: SUBMUCOSAL TATTOO INJECTION;  Surgeon: Kathi Der, MD;  Location: [facility];  Service: Gastroenterology;;    SOCIAL HISTORY: Social History   Socioeconomic History   Marital status: Widowed    Spouse name: Not on file   Number of children: Not on file   Years of education: Not on file   Highest education level: Not on file  Occupational History   Not on file  Tobacco Use   Smoking status: Every Day    Current packs/day: 1.00  Average packs/day: 1 pack/day for 37.0 years (37.0 ttl pk-yrs)    Types: E-cigarettes, Cigarettes   Smokeless tobacco: Never   Tobacco comments:    Counseled on reduce to quit. Information on classes shared  Vaping Use   Vaping status: Never Used  Substance and Sexual Activity   Alcohol use: No   Drug use: No   Sexual activity: Not on file  Other Topics Concern   Not on file  Social History Narrative    Not on file   Social Drivers of Health   Financial Resource Strain: Not on file  Food Insecurity: No Food Insecurity (07/02/2023)   Hunger Vital Sign    Worried About Running Out of Food in the Last Year: Never true    Ran Out of Food in the Last Year: Never true  Transportation Needs: No Transportation Needs (07/02/2023)   PRAPARE - Administrator, Civil Service (Medical): No    Lack of Transportation (Non-Medical): No  Physical Activity: Not on file  Stress: Not on file  Social Connections: Moderately Integrated (07/03/2023)   Social Connection and Isolation Panel [NHANES]    Frequency of Communication with Friends and Family: Three times a week    Frequency of Social Gatherings with Friends and Family: Twice a week    Attends Religious Services: 1 to 4 times per year    Active Member of [employer] or Organizations: No    Attends Banker Meetings: Never    Marital Status: Married  Recent Concern: Social Connections - Socially Isolated (07/03/2023)   Social Connection and Isolation Panel [NHANES]    Frequency of Communication with Friends and Family: Never    Frequency of Social Gatherings with Friends and Family: Never    Attends Religious Services: Never    Database administrator or Organizations: Not on file    Attends Banker Meetings: Never    Marital Status: Married  Catering manager Violence: Not At Risk (07/02/2023)   Humiliation, Afraid, Rape, and Kick questionnaire    Fear of Current or Ex-Partner: No    Emotionally Abused: No    Physically Abused: No    Sexually Abused: No    FAMILY HISTORY: History reviewed. No pertinent family history.  REVIEW OF SYSTEMS:   Constitutional: Denies fevers, chills or abnormal night sweats Eyes: Denies blurriness of vision, double vision or watery eyes Ears, nose, mouth, throat, and face: Denies mucositis or sore throat Respiratory: Denies cough, dyspnea or wheezes Cardiovascular: Denies palpitation,  chest discomfort or lower extremity swelling Gastrointestinal: + Abdominal soreness, denies nausea, heartburn or change in bowel habits Skin: Denies abnormal skin rashes Lymphatics: Denies new lymphadenopathy or easy bruising Neurological: Denies numbness, tingling or new weaknesses Behavioral/Psych: Mood is stable, no new changes  All other systems were reviewed with the patient and are negative.  PHYSICAL EXAMINATION: ECOG PERFORMANCE STATUS: 2 - Symptomatic, <50% confined to bed  Vitals:   07/07/23 0503 07/07/23 0859  BP: (!) 150/84 (!) 143/78  Pulse: 91 91  Resp: 20 18  Temp: 97.8 F (36.6 C) 97.9 F (36.6 C)  SpO2: 94% 94%   Filed Weights   07/02/23 1100 07/06/23 1022  Weight: 144 lb 2.9 oz (65.4 kg) 135 lb (61.2 kg)    GENERAL: alert, no distress and comfortable SKIN: skin color, texture, turgor are normal, no rashes or significant lesions EYES: normal, conjunctiva are pink and non-injected, sclera clear OROPHARYNX: no exudate, no erythema and lips, buccal mucosa, and tongue  normal  NECK: supple, thyroid normal size, non-tender, without nodularity LYMPH: no palpable lymphadenopathy in the cervical, axillary or inguinal LUNGS: clear to auscultation and percussion with normal breathing effort HEART: regular rate & rhythm and no murmurs and no lower extremity edema ABDOMEN: abdomen soft, normal bowel sounds +mild distention MUSCULOSKELETAL: no cyanosis of digits and no clubbing  PSYCH: alert & oriented x 3 with fluent speech NEURO: no focal motor/sensory deficits   ALLERGIES:  is allergic to beef-derived drug products, dog epithelium (canis lupus familiaris), pork-derived products, bee venom, diclofenac, and lisinopril.  MEDICATIONS:  Current Facility-Administered Medications  Medication Dose Route Frequency Provider Last Rate Last Admin   acetaminophen (TYLENOL) tablet 1,000 mg  1,000 mg Oral Q6H Fredricka Bonine, Chelsea A, MD   1,000 mg at 07/07/23 0753   gabapentin  (NEURONTIN) capsule 300 mg  300 mg Oral TID Phylliss Blakes A, MD   300 mg at 07/07/23 0900   heparin ADULT infusion 100 units/mL (25000 units/229mL)  1,300 Units/hr Intravenous Continuous Terrilee Files T, RPH 13 mL/hr at 07/07/23 0637 1,300 Units/hr at 07/07/23 1610   HYDROmorphone (DILAUDID) injection 0.5-1 mg  0.5-1 mg Intravenous Q2H PRN Phylliss Blakes A, MD   1 mg at 07/07/23 0216   lip balm (CARMEX) ointment   Topical PRN Berna Bue, MD   Given at 07/02/23 [phone]   magnesium sulfate IVPB 4 g 100 mL  4 g Intravenous Once Phylliss Blakes A, MD 50 mL/hr at 07/07/23 0756 4 g at 07/07/23 0756   methocarbamol (ROBAXIN) injection 500 mg  500 mg Intravenous Q6H PRN Phylliss Blakes A, MD   500 mg at 07/07/23 0216   nicotine (NICODERM CQ - dosed in mg/24 hours) patch 14 mg  14 mg Transdermal Daily PRN Phylliss Blakes A, MD   14 mg at 07/03/23 1933   ondansetron (ZOFRAN) tablet 4 mg  4 mg Oral Q6H PRN Berna Bue, MD       Or   ondansetron [facility]) injection 4 mg  4 mg Intravenous Q4H PRN Phylliss Blakes A, MD   4 mg at 07/05/23 2004   pantoprazole (PROTONIX) injection 40 mg  40 mg Intravenous Q12H Phylliss Blakes A, MD   40 mg at 07/07/23 0901   potassium chloride 10 mEq in 100 mL IVPB  10 mEq Intravenous Q1 Hr x 6 Phylliss Blakes A, MD 100 mL/hr at 07/07/23 0916 10 mEq at 07/07/23 0916     LABORATORY DATA:  I have reviewed the data as listed Lab Results  Component Value Date   WBC 20.8 (H) 07/07/2023   HGB 8.7 (L) 07/07/2023   HCT 28.1 (L) 07/07/2023   MCV 80.1 07/07/2023   PLT 427 (H) 07/07/2023   Recent Labs    05/06/23 0058 05/06/23 1203 07/01/23 1652 07/02/23 1039 07/03/23 0525 07/05/23 0428 07/06/23 0321 07/07/23 0533  NA 133*   < > 135 136   < > 137 137 137  K 3.0*   < > 3.2* 3.3*   < > 2.9* 3.5 3.4*  CL 102   < > 102 106   < > 103 105 102  CO2 23   < > 23 22   < > 27 26 26   GLUCOSE 98   < > 166* 110*   < > 93 92 105*  BUN 20   < > 19 20   < > 11 9 11    CREATININE 0.72   < > 0.75 0.69   < > 0.75 0.54  0.83  CALCIUM 8.5*   < > 9.3 8.9   < > 8.5* 8.6* 8.6*  GFRNONAA >60   < > >60 >60   < > >60 >60 >60  PROT 5.6*  --  6.0* 6.0*  --   --   --   --   ALBUMIN 2.8*  --  3.8 2.9*  --   --   --   --   AST 10*  --  11* 15  --   --   --   --   ALT 10  --  10 13  --   --   --   --   ALKPHOS 61  --  87 73  --   --   --   --   BILITOT 0.5  --  0.3 0.6  --   --   --   --    < > = values in this interval not displayed.    RADIOGRAPHIC STUDIES: I have personally reviewed the radiological images as listed and agreed with the findings in the report. VAS [place] UPPER EXTREMITY VENOUS DUPLEX Result Date: 07/05/2023 UPPER VENOUS STUDY  Patient Name:  Tina Mccullough  Date of Exam:   07/05/2023 Medical Rec #: [id number]               Accession #:    [id number] Date of Birth: [birth year]               Patient Gender: F Patient Age:   33 years Exam Location[facility]  [facility] Procedure:      VAS [place] UPPER EXTREMITY VENOUS DUPLEX Referring Phys: [facility] --------------------------------------------------------------------------------  Indications: Swelling Anticoagulation: Xarelto (prior to admission). Comparison Study: Previous exam on 05/05/2023 was positive for DVT in LUE                   (subclavian & axillary) & SVT (basilic) Performing Technologist: Ernestene Mention RVT, RDMS  Examination Guidelines: A complete evaluation includes B-mode imaging, spectral Doppler, color Doppler, and power Doppler as needed of all accessible portions of each vessel. Bilateral testing is considered an integral part of a complete examination. Limited examinations for reoccurring indications may be performed as noted.  Left Findings: +----------+------------+---------+-----------+----------+-----------------+ LEFT      CompressiblePhasicitySpontaneousProperties     Summary      +----------+------------+---------+-----------+----------+-----------------+ IJV           Full        Yes       Yes                                +----------+------------+---------+-----------+----------+-----------------+ Subclavian    Full       Yes       Yes                                +----------+------------+---------+-----------+----------+-----------------+ Axillary    Partial      Yes       Yes              Age Indeterminate +----------+------------+---------+-----------+----------+-----------------+ Brachial      Full       Yes       Yes                                +----------+------------+---------+-----------+----------+-----------------+  Radial        Full                                                    +----------+------------+---------+-----------+----------+-----------------+ Ulnar         Full                                                    +----------+------------+---------+-----------+----------+-----------------+ Cephalic      Full                                                    +----------+------------+---------+-----------+----------+-----------------+ Basilic       None       No        No               Age Indeterminate +----------+------------+---------+-----------+----------+-----------------+  Summary:  Right: No evidence of thrombosis in the subclavian.  Left: Findings consistent with age indeterminate deep vein thrombosis involving the left distal axillary vein. Findings consistent with age indeterminate superficial vein thrombosis involving the left forearm portion of cephalic vein and left upper arm postion of basilic vein.  *See table(s) above for measurements and observations.  Diagnosing physician: Gerarda Fraction Electronically signed by Gerarda Fraction on 07/05/2023 at 5:18:03 PM.    Final    CT CHEST W CONTRAST Result Date: 07/02/2023 CLINICAL DATA:  Colon cancer staging. EXAM: CT CHEST WITH CONTRAST TECHNIQUE: Multidetector CT imaging of the chest was performed during intravenous contrast administration. RADIATION  DOSE REDUCTION: This exam was performed according to the departmental dose-optimization program which includes automated exposure control, adjustment of the mA and/or kV according to patient size and/or use of iterative reconstruction technique. CONTRAST:  75mL OMNIPAQUE IOHEXOL 300 MG/ML  SOLN COMPARISON:  CT abdomen and pelvis 06/30/2022 FINDINGS: Cardiovascular: No significant vascular findings. Normal heart size. No pericardial effusion. Mediastinum/Nodes: Hypodense right thyroid nodule measures 11 mm. There are no enlarged mediastinal or hilar lymph nodes identified. Visualized esophagus is within normal limits. Lungs/Pleura: There is patchy airspace consolidation in the bilateral lower lobes, right greater than left. Mild emphysematous changes are present. There is no pleural effusion or pneumothorax. Upper Abdomen: There are 2 rounded hypodensities in the liver favored as cysts, unchanged. Nodular thickening of the adrenal glands again noted. Musculoskeletal: There are healed posterior left rib fractures. No acute fractures are seen. No focal osseous lesions are identified. IMPRESSION: 1. No evidence for metastatic disease in the chest. 2. Patchy airspace consolidation in the bilateral lower lobes, right greater than left, worrisome for pneumonia. 3. Emphysema. Emphysema (ICD10-J43.9). Electronically Signed   By: Darliss Cheney M.D.   On: 07/02/2023 19:19   CT ABDOMEN PELVIS W CONTRAST Result Date: 07/01/2023 CLINICAL DATA:  Right lower quadrant pain for 3 days. Blood in stool. Constipation. * Tracking Code: BO * EXAM: CT ABDOMEN AND PELVIS WITH CONTRAST TECHNIQUE: Multidetector CT imaging of the abdomen and pelvis was performed using the standard protocol following bolus administration of intravenous contrast. RADIATION DOSE REDUCTION: This exam was performed according to the  departmental dose-optimization program which includes automated exposure control, adjustment of the mA and/or kV according to  patient size and/or use of iterative reconstruction technique. CONTRAST:  ISOVUE-300 IOPAMIDOL (ISOVUE-300) INJECTION 61% COMPARISON:  06/24/2018 FINDINGS: Lower Chest: No acute findings. Hepatobiliary: No suspicious hepatic masses identified. A few tiny hepatic cysts are noted. Gallbladder is unremarkable. No evidence of biliary ductal dilatation. Pancreas:  No mass or inflammatory changes. Spleen: Within normal limits in size and appearance. Adrenals/Urinary Tract: Stable diffuse nodular thickening of both adrenal glands, consistent with nodular hyperplasia. No suspicious renal masses identified. No evidence of ureteral calculi or hydronephrosis. Stomach/Bowel: The right colon is dilated and contains a large amount of stool. A masslike constricting lesion is seen in the hepatic flexure of the colon measuring approximately 4 cm in length, highly suspicious for obstructing colon carcinoma. Adjacent sub-centimeter pericolonic lymph nodes at this site are seen, suspicious for local lymph node metastases. Mild right colonic wall thickening and adjacent soft tissue stranding also seen, consistent with colitis. No evidence of pneumatosis, free intraperitoneal air, or abscess. Normal appendix visualized. Vascular/Lymphatic: No pathologically enlarged lymph nodes. No acute vascular findings. Reproductive: No mass or other significant abnormality. Pessary noted in vagina. Other:  None. Musculoskeletal:  No suspicious bone lesions identified. IMPRESSION: Obstructing masslike lesion in hepatic flexure of colon, highly suspicious for colon carcinoma. Dilated ascending colon shows mild wall thickening and adjacent stranding, highly suspicious for colitis. No evidence of perforation or abscess. Adjacent sub-centimeter pericolonic lymph nodes at this site. Local lymph node metastases cannot be excluded. Mild right colonic wall thickening and adjacent soft tissue stranding, consistent with colitis. No evidence of distant  metastatic disease. These results will be called to the ordering clinician or representative by the Radiologist Assistant, and communication documented in the PACS or [employer]. Electronically Signed   By: Danae Orleans M.D.   On: 07/01/2023 15:18     The total time spent in the appointment was 55 minutes encounter with patients including review of chart and various tests results, discussions about plan of care and coordination of care plan   All questions were answered. The patient knows to call the clinic with any problems, questions or concerns. No barriers to learning was detected.  Dawson Bills, NP 1/29/20259:45 AM  Attending Note  I personally saw the patient, reviewed the chart and examined the patient. The plan of care was discussed with the patient and the admitting team. I agree with the assessment and plan as documented above. Thank you very much for the consultation. I have seen the patient, reviewed the pathology report in detail.  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  However she presented with obstruction and there seems to be a question if there is any evidence of penetration since the omentum was adherent to the area of cancer. I have discussed with patient that given questionable risk factors, I will present her in the GI tumor board.  Will also add microsatellite instability to the pathology.  We may consider adjuvant chemotherapy with FOLFOX for 3 months given stage II and some high risk features.  She is very willing to consider what ever adjuvant treatment the board recommends.  I also recommended genetic testing given family history of colon cancer in mother with questionable pancreatic cancer which once again we can take care of outpatient..  Thank you for consulting [place] in the care of this  patient.  Please do not hesitate to contact us with  any additional questions or concerns.

## 2023-07-08 DIAGNOSIS — K6389 Other specified diseases of intestine: Secondary | ICD-10-CM | POA: Diagnosis not present

## 2023-07-08 LAB — CBC
HCT: 26.5 % — ABNORMAL LOW (ref 36.0–46.0)
Hemoglobin: 8 g/dL — ABNORMAL LOW (ref 12.0–15.0)
MCH: 24.4 pg — ABNORMAL LOW (ref 26.0–34.0)
MCHC: 30.2 g/dL (ref 30.0–36.0)
MCV: 80.8 fL (ref 80.0–100.0)
Platelets: 456 10*3/uL — ABNORMAL HIGH (ref 150–400)
RBC: 3.28 MIL/uL — ABNORMAL LOW (ref 3.87–5.11)
RDW: 16.9 % — ABNORMAL HIGH (ref 11.5–15.5)
WBC: 24.2 10*3/uL — ABNORMAL HIGH (ref 4.0–10.5)
nRBC: 0 % (ref 0.0–0.2)

## 2023-07-08 LAB — MAGNESIUM: Magnesium: 2.1 mg/dL (ref 1.7–2.4)

## 2023-07-08 LAB — BASIC METABOLIC PANEL
Anion gap: 10 (ref 5–15)
BUN: 16 mg/dL (ref 8–23)
CO2: 25 mmol/L (ref 22–32)
Calcium: 8.4 mg/dL — ABNORMAL LOW (ref 8.9–10.3)
Chloride: 98 mmol/L (ref 98–111)
Creatinine, Ser: 0.84 mg/dL (ref 0.44–1.00)
GFR, Estimated: >60 mL/min (ref >60)
Glucose, Bld: 94 mg/dL (ref 70–99)
Potassium: 3.3 mmol/L — ABNORMAL LOW (ref 3.5–5.1)
Sodium: 133 mmol/L — ABNORMAL LOW (ref 135–145)

## 2023-07-08 LAB — HEPARIN LEVEL (UNFRACTIONATED)
Heparin Unfractionated: 0.33 [IU]/mL (ref 0.30–0.70)
Heparin Unfractionated: 0.35 [IU]/mL (ref 0.30–0.70)

## 2023-07-08 MED ORDER — POTASSIUM CHLORIDE CRYS ER 20 MEQ PO TBCR
40.0000 meq | EXTENDED_RELEASE_TABLET | ORAL | Status: AC
Start: 2023-07-08 — End: 2023-07-08
  Administered 2023-07-08 (×2): 40 meq via ORAL
  Filled 2023-07-08 (×2): qty 2

## 2023-07-08 MED ORDER — POTASSIUM CHLORIDE 10 MEQ/100ML IV SOLN
10.0000 meq | INTRAVENOUS | Status: DC
  Administered 2023-07-08: 10 meq via INTRAVENOUS
  Filled 2023-07-08: qty 100

## 2023-07-08 MED ORDER — ENSURE ENLIVE PO LIQD
237.0000 mL | Freq: Two times a day (BID) | ORAL | Status: DC
  Administered 2023-07-08 – 2023-07-15 (×6): 237 mL via ORAL

## 2023-07-08 NOTE — Progress Notes (Signed)
Colonic mass  Subjective: Somewhat better today. Some diarrhea overnight. No nausea, mild bloating.   Objective: Vital signs in last 24 hours: Temp:  [97.9 F (36.6 C)-99.1 F (37.3 C)] 97.9 F (36.6 C) (01/30 0624) Pulse Rate:  [87-108] 88 (01/30 0624) Resp:  [15-18] 16 (01/30 0624) BP: (120-150)/(68-95) 120/68 (01/30 0624) SpO2:  [93 %-96 %] 96 % (01/30 0624) Last BM Date : 07/06/23  Intake/Output from previous day: 01/29 0701 - 01/30 0700 In: 2426 [P.O.:1720; I.V.:288; IV Piggyback:418] Out: 900 [Urine:900] Intake/Output this shift: No intake/output data recorded.  General appearance: alert and cooperative Resp: unlabored respirations and pulling >1000 on IS GI: soft, mild distension mild TTP RLQ and along incision which is covered with honeycomb dressing with scant bloody drainage and staples intact, no cellulitis or hematoma  Lab Results:  Results for orders placed or performed during the hospital encounter of 07/01/23 (from the past 24 hours)  Heparin level (unfractionated)     Status: Abnormal   Collection Time: 07/07/23  2:38 PM  Result Value Ref Range   Heparin Unfractionated <0.10 (L) 0.30 - 0.70 IU/mL  Heparin level (unfractionated)     Status: None   Collection Time: 07/07/23 11:56 PM  Result Value Ref Range   Heparin Unfractionated 0.33 0.30 - 0.70 IU/mL  CBC     Status: Abnormal   Collection Time: 07/08/23  7:31 AM  Result Value Ref Range   WBC 24.2 (H) 4.0 - 10.5 K/uL   RBC 3.28 (L) 3.87 - 5.11 MIL/uL   Hemoglobin 8.0 (L) 12.0 - 15.0 g/dL   HCT 62.1 (L) 30.8 - 65.7 %   MCV 80.8 80.0 - 100.0 fL   MCH 24.4 (L) 26.0 - 34.0 pg   MCHC 30.2 30.0 - 36.0 g/dL   RDW 84.6 (H) 96.2 - 95.2 %   Platelets 456 (H) 150 - 400 K/uL   nRBC 0.0 0.0 - 0.2 %  Basic metabolic panel     Status: Abnormal   Collection Time: 07/08/23  7:31 AM  Result Value Ref Range   Sodium 133 (L) 135 - 145 mmol/L   Potassium 3.3 (L) 3.5 - 5.1 mmol/L   Chloride 98 98 - 111 mmol/L   CO2  25 22 - 32 mmol/L   Glucose, Bld 94 70 - 99 mg/dL   BUN 16 8 - 23 mg/dL   Creatinine, Ser 8.41 0.44 - 1.00 mg/dL   Calcium 8.4 (L) 8.9 - 10.3 mg/dL   GFR, Estimated >32 >44 mL/min   Anion gap 10 5 - 15  Magnesium     Status: None   Collection Time: 07/08/23  7:31 AM  Result Value Ref Range   Magnesium 2.1 1.7 - 2.4 mg/dL  Heparin level (unfractionated)     Status: None   Collection Time: 07/08/23  7:31 AM  Result Value Ref Range   Heparin Unfractionated 0.35 0.30 - 0.70 IU/mL     Studies/Results Radiology     MEDS, Scheduled  acetaminophen  1,000 mg Oral Q6H   gabapentin  300 mg Oral TID   methocarbamol  500 mg Oral TID   pantoprazole  40 mg Oral Daily     Assessment/Plan: 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  POD2 s/p open R hemicolectomy 1/28 Dr. Fredricka Bonine  S/p c scope 1/26 Dr. Doretha Imus - Biopsy with adenocarcinoma CEA elevated to 71.3, CT CH without met disease - surgical path pending - afebrile, WBC up to 24.2 today, yesterday was  20.8 from 8.3 preop.  Afebrile, no significant tachycardia or acidosis/AKI.  Trend. Hold off on further abx at this time and trend -Benign abdominal exam, having bowel function, no nausea.  Will advance to soft diet - hgb 8.0, relatively stable, heparin drip - encouraged mobilization and IS - oncology has been consulted - hypokalemia 3.3 replete IV  FEN: CLD ID: [id number] periop VTE: hep gtt  HTN COPD tobacco use LUE dvt - on xarelto op. Hep gtt   LOS: 7 days   Berna Bue, MD [facility] Surgery 07/08/2023, 8:52 AM Please see Amion for pager number during day hours 7:00am-4:30pm

## 2023-07-08 NOTE — Progress Notes (Signed)
- PROGRESS NOTE Tina Mccullough  RUE:[id number] DOB: Jun 04, 1951 DOA: 07/01/2023 PCP: [name], MD  Brief Narrative/Hospital Course: 73 year old female history of lymphocytic colitis, COPD DVT PE hypertension recent admission on 11/28 for fall and DVT presented to the [facility] with 4 days of purple stool then some blood, abdominal pain on right side and had outpatient CT scan done that showed a mass and potentially some colitis and sent to the ED In the ED hemodynamically stable-initially tachycardic 117, labs with hypokalemia severe anemia hemoglobin 7.1> 6.3 g and admission was requested to unit PRBC was ordered on admission. CT scan abdomen with contrast 1/23: Obstructing masslike lesion in hepatic flexure suspicious for colon carcinoma dilated ascending colon with mild wall thickening suspicious for colitis mild right colonic wall thickening. Patient had difficulty tolerating colon prep underwent colonoscopy 07/04/23 with poor prep showed large obstructing and infiltrative mass in the proximal transverse colon/hepatic flexure but able to advance the scope beyond this area biopsies taken. Biopsy showed adenocarcinoma.  1/28 underwent right hemicolectomy.   Subjective: Seen and examined Overnight afebrile BP stable labs this morning with hypokalemia getting IV potassium and complains of severe burning stopping on potassium and changing to p.o. On full liquid diet> tolerating advance to soft diet today Ambulating. No new complaints.  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 S.p 2 units PRBC-Hb stable so far. CT chest no  evidence of metastasis. GI surgery following  colonoscopy 07/04/23 with poor prep showed large obstructing and infiltrative mass in the proximal transverse colon/hepatic flexure but unable to advance the scope_Biopsy showed adenocarcinoma. 1/28 underwent right hemicolectomy surgical pathology pending. Postop WBC count trending up but no fever or worsening of pain or vital instability-trend WBC count holding off on antibiotics Oncology has been consulted-awaiting final pathology for further plan.  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  Patient back on heparin postop, monitor closely, DOAC one able and ok w/ CCS.1/27- duplex showed age indeterminate dvt lefft diatsl axillay vein  COPD: Not in exacerbation, cont IS.  Tobacco abuse: Continue nicotine patch  DVT prophylaxis: SCDs Start: 07/01/23 2312 Code Status:   Code Status: Full Code Family Communication: plan of care discussed with patient/family at bedside. Patient status is: Remains hospitalized because of severity of illness Level of care: Med-Surg   Dispo: The patient is from: From home            Anticipated disposition: Home in 1 to 2 days   Objective: Vitals last 24 hrs: Vitals:   07/07/23 0859 07/07/23 1241 07/07/23 2048  07/08/23 0624  BP: (!) 143/78 129/72 (!) 150/95 120/68  Pulse: 91 87 (!) 108 88  Resp: 18 18 15 16   Temp: 97.9 F (36.6 C) 97.9 F (36.6 C) 99.1 F (37.3 C) 97.9 F  (36.6 C)  TempSrc:   Oral Oral  SpO2: 94% 94% 93% 96%  Weight:      Height:       Weight change:   Physical Examination: General exam: alert awake, oriented at baseline, older than stated age HEENT:Oral mucosa moist, Ear/Nose WNL grossly Respiratory system: Bilaterally clear BS,no use of accessory muscle Cardiovascular system: S1 & S2 +, No JVD. Gastrointestinal system: Abdomen soft, surgical site wound honeycomb dressing in place bowel sounds present mild tenderness present  Nervous System: Alert, awake, moving all extremities,and following commands. Extremities: LE edema neg,distal peripheral pulses palpable and warm.  Skin: No rashes,no icterus. MSK: Normal muscle bulk,tone, power   Medications reviewed:  Scheduled Meds:  acetaminophen  1,000 mg Oral Q6H   feeding supplement  237 mL Oral BID BM   gabapentin  300 mg Oral TID   methocarbamol  500 mg Oral TID   pantoprazole  40 mg Oral Daily   potassium chloride  40 mEq Oral Q3H   Continuous Infusions:  heparin 1,450 Units/hr (07/08/23 0104)     Diet Order             DIET SOFT Fluid consistency: Thin  Diet effective now                   Intake/Output Summary (Last 24 hours) at 07/08/2023 1021 Last data filed at 07/08/2023 0924 Gross per 24 hour  Intake 2265.96 ml  Output 900 ml  Net 1365.96 ml   Net IO Since Admission: 9,232.88 mL [07/08/23 1021]  Wt Readings from Last 3 Encounters:  07/06/23 61.2 kg  06/14/23 65.4 kg  05/05/23 61.2 kg     Unresulted Labs (From admission, onward)     Start     Ordered   07/09/23 0500  Basic metabolic panel  Tomorrow morning,   R        07/08/23 0856   07/09/23 0500  Magnesium  Tomorrow morning,   R        07/08/23 0856   07/09/23 0500  Heparin level (unfractionated)  Daily,   R      07/08/23 0917   07/07/23 0600  CBC  Daily,   R      07/06/23 1516           Data Reviewed: I have personally reviewed following labs and imaging studies CBC: Recent Labs  Lab  07/04/23 0521 07/05/23 0428 07/06/23 0321 07/07/23 0533 07/08/23 0731  WBC 9.0 8.8 8.3 20.8* 24.2*  HGB 8.5* 8.0* 8.1* 8.7* 8.0*  HCT 27.6* 25.9* 25.7* 28.1* 26.5*  MCV 80.9 80.2 80.3 80.1 80.8  PLT 475* 424* 417* 427* 456*   Basic Metabolic Panel:  Recent Labs  Lab 07/04/23 0521 07/05/23 0428 07/06/23 0321 07/07/23 0533 07/08/23 0731  NA 139 137 137 137 133*  K 3.5 2.9* 3.5 3.4* 3.3*  CL 105 103 105 102 98  CO2 27 27 26 26 25   GLUCOSE 100* 93 92 105* 94  BUN 15 11 9 11 16   CREATININE 0.52 0.75 0.54 0.83 0.84  CALCIUM 8.9 8.5* 8.6* 8.6* 8.4*  MG  --   --   --  1.6* 2.1   GFR: Estimated Creatinine Clearance: 56.1 mL/min (by C-G formula based on  SCr of 0.84 mg/dL). Liver Function Tests:  Recent Labs  Lab 07/01/23 1652 07/02/23 1039  AST 11* 15  ALT 10 13  ALKPHOS 87 73  BILITOT 0.3 0.6  PROT 6.0* 6.0*  ALBUMIN 3.8 2.9*   Recent Labs  Lab 07/01/23 1652  LIPASE <10*   No results for input(s): "AMMONIA" in the last 168 hours. Coagulation Profile:  Recent Labs  Lab 07/01/23 1743  INR 1.0  No results found for this or any previous visit (from the past 240 hours).  Antimicrobials/Microbiology: Anti-infectives (From admission, onward)    Start     Dose/Rate Route Frequency Ordered Stop   07/06/23 0945  ceFAZolin (ANCEF) IVPB 2g/100 mL premix        2 g 200 mL/hr over 30 Minutes Intravenous On call to O.R. 07/06/23 1610 07/06/23 1221      No results found for: "SDES", "SPECREQUEST", "CULT", "REPTSTATUS"   Radiology Studies: No results found.   LOS: 7 days   Total time spent in review of labs and imaging, patient evaluation, formulation of plan, documentation and communication with family: 35 minutes  Lanae Boast, MD  [facility]  07/08/2023, 10:21 AM

## 2023-07-08 NOTE — Progress Notes (Signed)
PHARMACY - ANTICOAGULATION CONSULT NOTE  Pharmacy Consult for Heparin Indication: UE DVT  Allergies  Allergen Reactions   Beef-Derived Drug Products Hives    Alpha-gal   Dog Epithelium (Canis Lupus Familiaris) Anaphylaxis   Pork-Derived Products Hives    Alpha-gal   Bee Venom Hives   Diclofenac Nausea And Vomiting   Lisinopril Other (See Comments)    Headaches    Patient Measurements: Height: 5\' 5"  (165.1 cm) Weight: 61.2 kg (135 lb) IBW/kg (Calculated) : 57 Heparin Dosing Weight: TBW  Vital Signs: Temp: 99.1 F (37.3 C) (01/29 2048) Temp Source: Oral (01/29 2048) BP: 150/95 (01/29 2048) Pulse Rate: 108 (01/29 2048)  Labs: Recent Labs    07/05/23 0428 07/05/23 1152 07/06/23 0321 07/07/23 0533 07/07/23 1438 07/07/23 2356  HGB 8.0*  --  8.1* 8.7*  --   --   HCT 25.9*  --  25.7* 28.1*  --   --   PLT 424*  --  417* 427*  --   --   HEPARINUNFRC  --    < > <0.10* 0.24* <0.10* 0.33  CREATININE 0.75  --  0.54 0.83  --   --    < > = values in this interval not displayed.    Estimated Creatinine Clearance: 56.8 mL/min (by C-G formula based on SCr of 0.83 mg/dL).   Medical History: Past Medical History:  Diagnosis Date   Anxiety    Depression    DVT of axillary vein, acute left (HCC)    left subclavian vein   Hypertension    PONV (postoperative nausea and vomiting)    Pulmonary embolus (HCC)     Medications:  -Xarelto 20mg  daily PTA - last dose 06/30/23  Assessment:/ 73 yo F presented with abdominal pain and BRBPR on 1/23.  PMH includes Xarelto PTA for hx DVT/PE (04/2023) which was held on Admission.  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  New UE DVT noted on 1/27 and Pharmacy was consulted to start IV heparin without bolus.   Pork allergy noted.  She has hx alpha-gal and does not eat pork, however verified with patient that she tolerated heparin in November 2024 without any reaction and is ok to proceed with heparin as ordered.    1/28: OR for right hemicolectomy  07/08/23 Heparin level = 0.33 (therapeutic) following heparin rate increase to 1450 units/hr    No complications of therapy reported  Goal of Therapy:  Heparin level 0.3-0.7 units/ml Monitor platelets by anticoagulation protocol: Yes   Plan:  -Continue heparin infusion @ 1450 units/hr -No bolus due to recent procedure, bleeding   -Recheck heparin level in 8 hr to confirm therapeutic dose -Daily heparin level & CBC while on heparin   Maryellen Pile, PharmD Clinical Pharmacist 07/08/2023 12:22 AM

## 2023-07-08 NOTE — Progress Notes (Signed)
PHARMACY - ANTICOAGULATION CONSULT NOTE  Pharmacy Consult for heparin  Indication: DVT  Allergies  Allergen Reactions   Beef-Derived Drug Products Hives    Alpha-gal   Dog Epithelium (Canis Lupus Familiaris) Anaphylaxis   Pork-Derived Products Hives    Alpha-gal   Bee Venom Hives   Diclofenac Nausea And Vomiting   Lisinopril Other (See Comments)    Headaches    Patient Measurements: Height: 5\' 5"  (165.1 cm) Weight: 61.2 kg (135 lb) IBW/kg (Calculated) : 57 Heparin Dosing Weight: 61 kg  Vital Signs: Temp: 97.9 F (36.6 C) (01/30 0624) Temp Source: Oral (01/30 0624) BP: 120/68 (01/30 0624) Pulse Rate: 88 (01/30 0624)  Labs: Recent Labs    07/06/23 0321 07/07/23 0533 07/07/23 1438 07/07/23 2356  HGB 8.1* 8.7*  --   --   HCT 25.7* 28.1*  --   --   PLT 417* 427*  --   --   HEPARINUNFRC <0.10* 0.24* <0.10* 0.33  CREATININE 0.54 0.83  --   --     Estimated Creatinine Clearance: 56.8 mL/min (by C-G formula based on SCr of 0.83 mg/dL).   Medical History: Past Medical History:  Diagnosis Date   Anxiety    Depression    DVT of axillary vein, acute left (HCC)    left subclavian vein   Hypertension    PONV (postoperative nausea and vomiting)    Pulmonary embolus (HCC)     Medications:  - on xarelto 20 mg daily PTA  Assessment: Patient is a 73 y.o F with hx recent left UE DVT in Nov 2024 on xarelto PTA who presented to the ED on 07/01/23 with abdominal pain and hematochezia with outpatient CT scan done, on the same day, that showed a colon mass. 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  Her DOAC was held on admission and she was transitioned to heparin drip on 07/04/22.  - Repeat UE doppler on 07/05/23 showed "age indeterminate deep vein thrombosis involving the left distal axillary vein."  She was transitioned to heparin drip   Today,  07/08/2023: - heparin level collected at 0730 is therapeutic at 0.35 - cbc somewhat stable  - no new bleeding documented    Goal of Therapy:  Heparin level 0.3-0.7 units/ml Monitor platelets by anticoagulation protocol: Yes   Plan:  -  continue heparin drip at 1450 units/hr - daily heparin level and cbc  - monitor for s/sx bleeding   Allard Lightsey P 07/08/2023,7:31 AM

## 2023-07-08 NOTE — Plan of Care (Signed)
Problem: Activity: Goal: Risk for activity intolerance will decrease Outcome: Adequate for Discharge   Problem: Nutrition: Goal: Adequate nutrition will be maintained Outcome: Adequate for Discharge

## 2023-07-09 DIAGNOSIS — C189 Malignant neoplasm of colon, unspecified: Secondary | ICD-10-CM | POA: Diagnosis present

## 2023-07-09 DIAGNOSIS — R4589 Other symptoms and signs involving emotional state: Secondary | ICD-10-CM

## 2023-07-09 DIAGNOSIS — K6389 Other specified diseases of intestine: Secondary | ICD-10-CM | POA: Diagnosis not present

## 2023-07-09 DIAGNOSIS — Z7189 Other specified counseling: Secondary | ICD-10-CM | POA: Diagnosis not present

## 2023-07-09 DIAGNOSIS — Z515 Encounter for palliative care: Secondary | ICD-10-CM | POA: Diagnosis not present

## 2023-07-09 LAB — CBC
HCT: 24.5 % — ABNORMAL LOW (ref 36.0–46.0)
Hemoglobin: 7.6 g/dL — ABNORMAL LOW (ref 12.0–15.0)
MCH: 24.8 pg — ABNORMAL LOW (ref 26.0–34.0)
MCHC: 31 g/dL (ref 30.0–36.0)
MCV: 79.8 fL — ABNORMAL LOW (ref 80.0–100.0)
Platelets: 483 10*3/uL — ABNORMAL HIGH (ref 150–400)
RBC: 3.07 MIL/uL — ABNORMAL LOW (ref 3.87–5.11)
RDW: 17.2 % — ABNORMAL HIGH (ref 11.5–15.5)
WBC: 13.9 10*3/uL — ABNORMAL HIGH (ref 4.0–10.5)
nRBC: 0 % (ref 0.0–0.2)

## 2023-07-09 LAB — MAGNESIUM: Magnesium: 1.8 mg/dL (ref 1.7–2.4)

## 2023-07-09 LAB — BASIC METABOLIC PANEL
Anion gap: 5 (ref 5–15)
BUN: 21 mg/dL (ref 8–23)
CO2: 24 mmol/L (ref 22–32)
Calcium: 8.3 mg/dL — ABNORMAL LOW (ref 8.9–10.3)
Chloride: 104 mmol/L (ref 98–111)
Creatinine, Ser: 0.69 mg/dL (ref 0.44–1.00)
GFR, Estimated: >60 mL/min (ref >60)
Glucose, Bld: 106 mg/dL — ABNORMAL HIGH (ref 70–99)
Potassium: 3.8 mmol/L (ref 3.5–5.1)
Sodium: 133 mmol/L — ABNORMAL LOW (ref 135–145)

## 2023-07-09 LAB — HEPARIN LEVEL (UNFRACTIONATED): Heparin Unfractionated: <0.1 [IU]/mL — ABNORMAL LOW (ref 0.30–0.70)

## 2023-07-09 LAB — SURGICAL PATHOLOGY

## 2023-07-09 MED ORDER — MAGNESIUM SULFATE 2 GM/50ML IV SOLN
2.0000 g | Freq: Once | INTRAVENOUS | Status: AC
  Administered 2023-07-09: 2 g via INTRAVENOUS
  Filled 2023-07-09: qty 50

## 2023-07-09 MED ORDER — POTASSIUM CHLORIDE 10 MEQ/100ML IV SOLN: 10.0000 meq | INTRAVENOUS | Status: DC

## 2023-07-09 MED ORDER — ENOXAPARIN SODIUM 60 MG/0.6ML IJ SOSY
60.0000 mg | PREFILLED_SYRINGE | Freq: Two times a day (BID) | INTRAMUSCULAR | Status: DC
  Administered 2023-07-09 (×2): 60 mg via SUBCUTANEOUS
  Filled 2023-07-09 (×3): qty 0.6

## 2023-07-09 MED ORDER — POTASSIUM CHLORIDE 20 MEQ PO PACK
20.0000 meq | PACK | Freq: Once | ORAL | Status: AC
Start: 2023-07-09 — End: 2023-07-09
  Administered 2023-07-09: 20 meq via ORAL
  Filled 2023-07-09: qty 1

## 2023-07-09 NOTE — Consult Note (Signed)
Consultation Note Date: 07/09/2023   Patient Name: Kaaliyah Kita  DOB: [birth year]  MRN: [id number]  Age / Sex: 73 y.o., female   PCP: [name], MD Referring Physician: Lanae Boast, MD  Reason for Consultation: Establishing goals of care     Chief Complaint/History of Present Illness:   Patient is a 73 year old female with a past medical history of lymphocytic colitis, COPD, DVT/PE, and hypertension who was admitted on 07/01/2023 after patient had been found to have CT scan showing colon mass and potential colitis.  Upon admission, repeat imaging had shown obstructing masslike lesion in hepatic flexure suspicious for colon carcinoma dilating the ascending colon with mild wall thickening suspicious for colitis and right colonic wall thickening.  During hospitalization, patient had biopsy showing adenocarcinoma.  Surgery was consulted and patient underwent right hemicolectomy on 07/06/2023.  Margins from surgery were unremarkable and tissue sample noted invasive moderately differentiated adenocarcinoma extending through submucosa, muscularis propria and into subserosal adipose tissue.  Oncology consulted and plans to have patient follow-up in outpatient setting.  Palliative medicine team consulted to assist with complex medical decision making.  Palliative medicine team initially consulted prior to patient deciding to undergo surgery.  Have been reviewing chart to determine palliative medicine needs.  Patient's overall medical status has continued to improve daily.  Patient will be returning home without any need for services in terms of PT/OT.  Patient likely to discharge soon.  Surgical team has been managing postop pain.  Presented to bedside to meet with patient.  No family present at patient's bedside.  Introduced myself as a member of the palliative medicine team and my role in patient's medical journey.  Patient initially hesitant to discuss care as had not been informed palliative  medicine team had been consulted.  Explained similarities and differences between palliative medicine and hospice due to patient's concerns.  Patient had been concerned because her husband who had passed away had Lewy body dementia and had hospice support during the time of COVID.  Explained that people can continue to get cancer directed therapies and be in "cured"/remission while still receiving palliative medicine support for symptom management since we are not hospice.  After explaining this, patient voiced appreciation for continued palliative medicine follow-up in the outpatient setting to manage symptoms as she moves forward with cancer directed therapies.  Spent time providing emotional support via active listening.  Spent time discussing would place referral to outpatient palliative medicine team at cancer center for follow-up.  Answered all questions as able.  Thanked patient for allow me to meet with her today.  Primary Diagnoses  Present on Admission:  Colonic mass  DVT (deep venous thrombosis) (HCC)  Essential hypertension  COPD (chronic obstructive pulmonary disease) (HCC)  Hypokalemia  Tobacco abuse  Acute posthemorrhagic anemia   Past Medical History:  Diagnosis Date   Anxiety    Depression    DVT of axillary vein, acute left (HCC)    left subclavian vein   Hypertension    PONV (postoperative nausea and vomiting)    Pulmonary embolus (HCC)    Social History   Socioeconomic History   Marital status: Widowed    Spouse name: Not on file   Number of children: Not on file   Years of education: Not on file   Highest education level: Not on file  Occupational History   Not on file  Tobacco Use   Smoking status: Every Day    Current packs/day: 1.00    Average packs/day:  1 pack/day for 37.0 years (37.0 ttl pk-yrs)    Types: E-cigarettes, Cigarettes   Smokeless tobacco: Never   Tobacco comments:    Counseled on reduce to quit. Information on classes shared  Vaping Use    Vaping status: Never Used  Substance and Sexual Activity   Alcohol use: No   Drug use: No   Sexual activity: Not on file  Other Topics Concern   Not on file  Social History Narrative   Not on file   Social Drivers of Health   Financial Resource Strain: Not on file  Food Insecurity: No Food Insecurity (07/02/2023)   Hunger Vital Sign    Worried About Running Out of Food in the Last Year: Never true    Ran Out of Food in the Last Year: Never true  Transportation Needs: No Transportation Needs (07/02/2023)   PRAPARE - Administrator, Civil Service (Medical): No    Lack of Transportation (Non-Medical): No  Physical Activity: Not on file  Stress: Not on file  Social Connections: Moderately Integrated (07/03/2023)   Social Connection and Isolation Panel [NHANES]    Frequency of Communication with Friends and Family: Three times a week    Frequency of Social Gatherings with Friends and Family: Twice a week    Attends Religious Services: 1 to 4 times per year    Active Member of [employer] or Organizations: No    Attends Banker Meetings: Never    Marital Status: Married  Recent Concern: Social Connections - Socially Isolated (07/03/2023)   Social Connection and Isolation Panel [NHANES]    Frequency of Communication with Friends and Family: Never    Frequency of Social Gatherings with Friends and Family: Never    Attends Religious Services: Never    Database administrator or Organizations: Not on file    Attends Banker Meetings: Never    Marital Status: Married   History reviewed. No pertinent family history. Scheduled Meds:  acetaminophen  1,000 mg Oral Q6H   feeding supplement  237 mL Oral BID BM   gabapentin  300 mg Oral TID   methocarbamol  500 mg Oral TID   pantoprazole  40 mg Oral Daily   Continuous Infusions:  heparin 1,450 Units/hr (07/08/23 1835)   PRN Meds:.HYDROmorphone (DILAUDID) injection, lip balm, nicotine, ondansetron **OR**  ondansetron (ZOFRAN) IV, oxyCODONE Allergies  Allergen Reactions   Beef-Derived Drug Products Hives    Alpha-gal   Dog Epithelium (Canis Lupus Familiaris) Anaphylaxis   Pork-Derived Products Hives    Alpha-gal   Bee Venom Hives   Diclofenac Nausea And Vomiting   Lisinopril Other (See Comments)    Headaches   CBC:    Component Value Date/Time   WBC 13.9 (H) 07/09/2023 0434   HGB 7.6 (L) 07/09/2023 0434   HCT 24.5 (L) 07/09/2023 0434   PLT 483 (H) 07/09/2023 0434   MCV 79.8 (L) 07/09/2023 0434   NEUTROABS 12.1 (H) 05/05/2023 1140   LYMPHSABS 1.3 05/05/2023 1140   MONOABS 1.2 (H) 05/05/2023 1140   EOSABS 0.0 05/05/2023 1140   BASOSABS 0.1 05/05/2023 1140   Comprehensive Metabolic Panel:    Component Value Date/Time   NA 133 (L) 07/09/2023 0434   K 3.8 07/09/2023 0434   CL 104 07/09/2023 0434   CO2 24 07/09/2023 0434   BUN 21 07/09/2023 0434   CREATININE 0.69 07/09/2023 0434   GLUCOSE 106 (H) 07/09/2023 0434   CALCIUM 8.3 (L) 07/09/2023 0434  AST 15 07/02/2023 1039   ALT 13 07/02/2023 1039   ALKPHOS 73 07/02/2023 1039   BILITOT 0.6 07/02/2023 1039   PROT 6.0 (L) 07/02/2023 1039   ALBUMIN 2.9 (L) 07/02/2023 1039    Physical Exam: Vital Signs: BP (!) 144/85 (BP Location: Right Arm)   Pulse 91   Temp 97.7 F (36.5 C) (Oral)   Resp 15   Ht 5\' 5"  (1.651 m)   Wt 61.2 kg   SpO2 99%   BMI 22.47 kg/m  SpO2: SpO2: 99 % O2 Device: O2 Device: Room Air O2 Flow Rate: O2 Flow Rate (L/min): 2 L/min Intake/output summary:  Intake/Output Summary (Last 24 hours) at 07/09/2023 9604 Last data filed at 07/09/2023 0600 Gross per 24 hour  Intake 1191.62 ml  Output 0 ml  Net 1191.62 ml   LBM: Last BM Date : 07/08/23 Baseline Weight: Weight: 65.4 kg Most recent weight: Weight: 61.2 kg  General: NAD, alert, pleasant, laying in bed Cardiovascular: RRR Respiratory: no increased work of breathing noted, not in respiratory distress Neuro: A&Ox4, following commands  easily Psych: appropriately answers all questions, pleasant          Palliative Performance Scale: 80%              Additional Data Reviewed: Recent Labs    07/08/23 0731 07/09/23 0434  WBC 24.2* 13.9*  HGB 8.0* 7.6*  PLT 456* 483*  NA 133* 133*  BUN 16 21  CREATININE 0.84 0.69    Imaging: VAS [place] UPPER EXTREMITY VENOUS DUPLEX UPPER VENOUS STUDY    Patient Name:  BABE ANTHIS  Date of Exam:   07/05/2023 Medical Rec #: [id number]               Accession #:    [id number] Date of Birth: 12/26/1950               Patient Gender: F Patient Age:   70 years Exam Location:  [facility] Procedure:      VAS [place] UPPER EXTREMITY VENOUS DUPLEX Referring Phys: [facility]  --------------------------------------------------------------------------------   Indications: Swelling Anticoagulation: Xarelto (prior to admission). Comparison Study: Previous exam on 05/05/2023 was positive for DVT in LUE                   (subclavian & axillary) & SVT (basilic)  Performing Technologist: Ernestene Mention RVT, RDMS    Examination Guidelines: A complete evaluation includes B-mode imaging, spectral Doppler, color Doppler, and power Doppler as needed of all accessible portions of each vessel. Bilateral testing is considered an integral part of a complete examination. Limited examinations for reoccurring indications may be performed as noted.     Left Findings: +----------+------------+---------+-----------+----------+-----------------+ LEFT      CompressiblePhasicitySpontaneousProperties     Summary      +----------+------------+---------+-----------+----------+-----------------+ IJV           Full       Yes       Yes                                +----------+------------+---------+-----------+----------+-----------------+ Subclavian    Full       Yes       Yes                                 +----------+------------+---------+-----------+----------+-----------------+ Axillary    Partial  Yes       Yes              Age Indeterminate +----------+------------+---------+-----------+----------+-----------------+ Brachial      Full       Yes       Yes                                +----------+------------+---------+-----------+----------+-----------------+ Radial        Full                                                    +----------+------------+---------+-----------+----------+-----------------+ Ulnar         Full                                                    +----------+------------+---------+-----------+----------+-----------------+ Cephalic      Full                                                    +----------+------------+---------+-----------+----------+-----------------+ Basilic       None       No        No               Age Indeterminate +----------+------------+---------+-----------+----------+-----------------+    Summary:   Right: No evidence of thrombosis in the subclavian.   Left: Findings consistent with age indeterminate deep vein thrombosis involving the left distal axillary vein. Findings consistent with age indeterminate superficial vein thrombosis involving the left forearm portion of cephalic vein and left upper arm postion of basilic vein.    *See table(s) above for measurements and observations.   Diagnosing physician: Gerarda Fraction Electronically signed by Gerarda Fraction on 07/05/2023 at 5:18:03 PM.      Final      I personally reviewed recent imaging.   Palliative Care Assessment and Plan Summary of Established Goals of Care and Medical Treatment Preferences   Patient is a 73 year old female with a past medical history of lymphocytic colitis, COPD, DVT/PE, and hypertension who was admitted on 07/01/2023 after patient had been found to have CT scan showing colon mass and potential  colitis.  Upon admission, repeat imaging had shown obstructing masslike lesion in hepatic flexure suspicious for colon carcinoma dilating the ascending colon with mild wall thickening suspicious for colitis and right colonic wall thickening.  During hospitalization, patient had biopsy showing adenocarcinoma.  Surgery was consulted and patient underwent right hemicolectomy on 07/06/2023.  Margins from surgery were unremarkable and tissue sample noted invasive moderately differentiated adenocarcinoma extending through submucosa, muscularis propria and into subserosal adipose tissue.  Oncology consulted and plans to have patient follow-up in outpatient setting.  Palliative medicine team consulted to assist with complex medical decision making.  # Complex medical decision making/goals of care  -Patient planning to follow-up with oncology in the outpatient setting to discuss cancer directed therapies moving forward.  -Patient agreeing with referral to outpatient palliative medicine at [facility] for follow-up to continue symptom management discussions as needed  moving forward based on oncology therapy recommendations.  -  Code Status: Full Code   # Discharge Planning:  Home with Palliative Services -Placed referral to [facility] for outpatient PMT follow up.   Thank you for allowing the palliative care team to participate in the care Algernon Huxley.  Alvester Morin, DO Palliative Care Provider PMT # [phone]  If patient remains symptomatic despite maximum doses, please call PMT at [phone] between 0700 and 1900. Outside of these hours, please call attending, as PMT does not have night coverage.  Personally spent 60 minutes in patient care including extensive chart review (labs, imaging, progress/consult notes, vital signs), medically appropraite exam, discussed with treatment team, education to patient, family, and staff, documenting clinical information, medication review and management, coordination  of care, and available advanced directive documents.

## 2023-07-09 NOTE — Progress Notes (Signed)
- PROGRESS NOTE Tina Mccullough  WGN:[id number] DOB: 1950-08-29 DOA: 07/01/2023 PCP: [name], MD  Brief Narrative/Hospital Course: 73 year old female history of lymphocytic colitis, COPD DVT PE hypertension recent admission on 11/28 for fall and DVT presented to the [facility] with 4 days of purple stool then some blood, abdominal pain on right side and had outpatient CT scan done that showed a mass and potentially some colitis and sent to the ED In the ED hemodynamically stable-initially tachycardic 117, labs with hypokalemia severe anemia hemoglobin 7.1> 6.3 g and admission was requested to unit PRBC was ordered on admission. CT scan abdomen with contrast 1/23: Obstructing masslike lesion in hepatic flexure suspicious for colon carcinoma dilated ascending colon with mild wall thickening suspicious for colitis mild right colonic wall thickening. Patient had difficulty tolerating colon prep underwent colonoscopy 07/04/23 with poor prep showed large obstructing and infiltrative mass in the proximal transverse colon/hepatic flexure but able to advance the scope beyond this area biopsies taken. Biopsy showed adenocarcinoma.  1/28 underwent right hemicolectomy> path showed :Invasive moderately differentiated adenocarcinoma extending through submucosa, muscularis propria and into subserosal adipose tissue, see note.Margins negative.Unremarkable appendix.    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 S.p 2 units PRBC-Hb stable so far. CT chest no evidence of metastasis. GI surgery following  07/04/23 colonoscopy  -large obstructing and infiltrative mass in the proximal transverse colon/hepatic flexure but unable to advance the scope_Biopsy showed adenocarcinoma. 07/06/23 underwent right hemicolectomy surgical pathology:>Invasive moderately differentiated adenocarcinoma extending through submucosa, muscularis propria and into subserosal adipose tissue, see note.Margins negative.Unremarkable appendix. Postop significant leukocytosis but afebrile, WBC now downtrending patient tolerating soft diet no BM yet.  Continue pain control PT OT ambulation Hopefully home 24 hours Recent Labs  Lab 07/05/23 0428 07/06/23 0321 07/07/23 0533 07/08/23 0731 07/09/23 0434  WBC 8.8 8.3 20.8* 24.2* 13.9*     Hypokalemia Hypomagnesemia: Replaced po Recent Labs  Lab 07/05/23 0428 07/06/23 0321 07/07/23 0533 07/08/23 0731 07/09/23 0434  K 2.9* 3.5 3.4* 3.3* 3.8  CALCIUM 8.5* 8.6* 8.6* 8.4* 8.3*  MG  --   --  1.6* 2.1 1.8   Hypertension: BP fairly controlled on PRNs   History of occlusive DVT in the left subclavian and axillary vein  05/05/23 CT angio 11/27 negative for PE 05/05/23 1/27- duplex showed age indeterminate dvt lefft diatsl axillay vein: PTA on xarelto.  Patient back on heparin postop, monitor closely, changed to DOAC one able and ok w/ CCS.  COPD: Not in exacerbation, cont IS.  Tobacco abuse: Continue nicotine patch  DVT prophylaxis: SCDs Start: 07/01/23 2312 Code Status:   Code Status: Full Code Family Communication: plan of care discussed with patient/family at bedside. Patient status is: Remains hospitalized because of severity of illness Level of care: Med-Surg   Dispo: The patient is from: From home            Anticipated disposition: Home in 24 hours   Objective: Vitals last 24 hrs: Vitals:  07/08/23 0624 07/08/23 1334  07/08/23 2023 07/09/23 0547  BP: 120/68 110/67 (!) 140/78 (!) 144/85  Pulse: 88 79 88 91  Resp: 16 16 16 15   Temp: 97.9 F (36.6 C) 97.7 F (36.5 C) 98.6 F (37 C) 97.7 F (36.5 C)  TempSrc: Oral Oral Oral Oral  SpO2: 96% 94% 98% 99%  Weight:      Height:       Weight change:   Physical Examination: General exam: alert awake, oriented at baseline, older than stated age HEENT:Oral mucosa moist, Ear/Nose WNL grossly Respiratory system: Bilaterally clear BS,no use of accessory muscle Cardiovascular system: S1 & S2 +, No JVD. Gastrointestinal system: Abdomen soft, surgical site with honeycomb dressing mild tenderness, BS+ Nervous System: Alert, awake, moving all extremities,and following commands. Extremities: LE edema neg,distal peripheral pulses palpable and warm.  Skin: No rashes,no icterus. MSK: Normal muscle bulk,tone, power   Medications reviewed:  Scheduled Meds:  acetaminophen  1,000 mg Oral Q6H   feeding supplement  237 mL Oral BID BM   gabapentin  300 mg Oral TID   methocarbamol  500 mg Oral TID   pantoprazole  40 mg Oral Daily   Continuous Infusions:  heparin 1,450 Units/hr (07/08/23 1835)     Diet Order             DIET SOFT Fluid consistency: Thin  Diet effective now                   Intake/Output Summary (Last 24 hours) at 07/09/2023 0945 Last data filed at 07/09/2023 0600 Gross per 24 hour  Intake 1191.62 ml  Output 0 ml  Net 1191.62 ml   Net IO Since Admission: 10,425.44 mL [07/09/23 0945]  Wt Readings from Last 3 Encounters:  07/06/23 61.2 kg  06/14/23 65.4 kg  05/05/23 61.2 kg     Unresulted Labs (From admission, onward)     Start     Ordered   07/09/23 0500  Heparin level (unfractionated)  Daily,   R      07/08/23 0917   07/07/23 0600  CBC  Daily,   R      07/06/23 1516           Data Reviewed: I have personally reviewed following labs and imaging studies CBC: Recent Labs  Lab 07/05/23 0428 07/06/23 0321 07/07/23 0533  07/08/23 0731 07/09/23 0434  WBC 8.8 8.3 20.8* 24.2* 13.9*  HGB 8.0* 8.1* 8.7* 8.0* 7.6*  HCT 25.9* 25.7* 28.1* 26.5* 24.5*  MCV 80.2 80.3 80.1 80.8 79.8*  PLT 424* 417* 427* 456* 483*   Basic Metabolic Panel:  Recent Labs  Lab 07/05/23 0428 07/06/23 0321 07/07/23 0533 07/08/23 0731 07/09/23 0434  NA 137 137 137 133* 133*  K 2.9* 3.5 3.4* 3.3* 3.8  CL 103 105 102 98 104  CO2 27 26 26 25 24   GLUCOSE 93 92 105* 94 106*  BUN 11 9 11 16 21   CREATININE 0.75 0.54 0.83 0.84 0.69  CALCIUM 8.5* 8.6* 8.6* 8.4* 8.3*  MG  --   --  1.6* 2.1 1.8   GFR: Estimated Creatinine Clearance: 58.9 mL/min (by C-G formula based on SCr of 0.69 mg/dL). Liver Function Tests:  Recent Labs  Lab 07/02/23 1039  AST 15  ALT 13  ALKPHOS 73  BILITOT 0.6  PROT 6.0*  ALBUMIN 2.9*   No results for input(s): "LIPASE", "AMYLASE" in the last 168 hours.  No results for input(s): "AMMONIA" in the last 168 hours.  Coagulation Profile:  No results for input(s): "INR", "PROTIME" in the last 168 hours. No results found for this or any previous visit (from the past 240 hours).  Antimicrobials/Microbiology: Anti-infectives (From admission, onward)    Start     Dose/Rate Route Frequency Ordered Stop   07/06/23 0945  ceFAZolin (ANCEF) IVPB 2g/100 mL premix        2 g 200 mL/hr over 30 Minutes Intravenous On call to O.R. 07/06/23 4098 07/06/23 1221      No results found for: "SDES", "SPECREQUEST", "CULT", "REPTSTATUS"   Radiology Studies: No results found.   LOS: 8 days   Total time spent in review of labs and imaging, patient evaluation, formulation of plan, documentation and communication with family: 35 minutes  Lanae Boast, MD  [facility]  07/09/2023, 9:45 AM

## 2023-07-09 NOTE — Progress Notes (Signed)
Colonic mass  Subjective: States she is passing some mucous stools and flatus but feeling more bloated. No nausea and was able to eat yesterday. Did not ambulate as much due to pain in her right arm  Objective: Vital signs in last 24 hours: Temp:  [97.7 F (36.5 C)-98.6 F (37 C)] 97.7 F (36.5 C) (01/31 0547) Pulse Rate:  [79-91] 91 (01/31 0547) Resp:  [15-16] 15 (01/31 0547) BP: (110-144)/(67-85) 144/85 (01/31 0547) SpO2:  [94 %-99 %] 99 % (01/31 0547) Last BM Date : 07/08/23  Intake/Output from previous day: 01/30 0701 - 01/31 0700 In: 1432.6 [P.O.:1260; I.V.:171.6; IV Piggyback:0.9] Out: 0  Intake/Output this shift: No intake/output data recorded.  General appearance: alert and cooperative Resp: unlabored respirations GI: soft, mild to moderate distension, mild TTP RLQ and along incision which is covered with honeycomb dressing with scant bloody drainage and staples intact, no cellulitis or hematoma  Lab Results:  Results for orders placed or performed during the hospital encounter of 07/01/23 (from the past 24 hours)  CBC     Status: Abnormal   Collection Time: 07/09/23  4:34 AM  Result Value Ref Range   WBC 13.9 (H) 4.0 - 10.5 K/uL   RBC 3.07 (L) 3.87 - 5.11 MIL/uL   Hemoglobin 7.6 (L) 12.0 - 15.0 g/dL   HCT 16.1 (L) 09.6 - 04.5 %   MCV 79.8 (L) 80.0 - 100.0 fL   MCH 24.8 (L) 26.0 - 34.0 pg   MCHC 31.0 30.0 - 36.0 g/dL   RDW 40.9 (H) 81.1 - 91.4 %   Platelets 483 (H) 150 - 400 K/uL   nRBC 0.0 0.0 - 0.2 %  Basic metabolic panel     Status: Abnormal   Collection Time: 07/09/23  4:34 AM  Result Value Ref Range   Sodium 133 (L) 135 - 145 mmol/L   Potassium 3.8 3.5 - 5.1 mmol/L   Chloride 104 98 - 111 mmol/L   CO2 24 22 - 32 mmol/L   Glucose, Bld 106 (H) 70 - 99 mg/dL   BUN 21 8 - 23 mg/dL   Creatinine, Ser 7.82 0.44 - 1.00 mg/dL   Calcium 8.3 (L) 8.9 - 10.3 mg/dL   GFR, Estimated >95 >62 mL/min   Anion gap 5 5 - 15  Magnesium     Status: None   Collection  Time: 07/09/23  4:34 AM  Result Value Ref Range   Magnesium 1.8 1.7 - 2.4 mg/dL  Heparin level (unfractionated)     Status: Abnormal   Collection Time: 07/09/23  4:34 AM  Result Value Ref Range   Heparin Unfractionated <0.10 (L) 0.30 - 0.70 IU/mL     Studies/Results Radiology     MEDS, Scheduled  acetaminophen  1,000 mg Oral Q6H   feeding supplement  237 mL Oral BID BM   gabapentin  300 mg Oral TID   methocarbamol  500 mg Oral TID   pantoprazole  40 mg Oral Daily     Assessment/Plan: 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  POD3 s/p open R hemicolectomy 1/28 Dr. Fredricka Bonine  S/p c scope 1/26 Dr. Doretha Imus - Biopsy with adenocarcinoma CEA elevated to 71.3, CT CH without met disease - surgical path with adenocarcinoma which I have discussed with patient. She has oncology follow up - afebrile, WBC down to 13.9. Hold off on further abx at this time -Benign abdominal exam, having bowel function, no nausea.  Some more distension with soft diet -  continue for now and encouraged ambulation - hgb 7.6 (8.0), relatively stable, heparin drip - encouraged mobilization and IS - oncology has been consulted - supplement K PO and mag IV  Hopefully home in next 24 hours. Monitor distension and diet tolerance today  FEN: soft ID: ancef periop VTE: hep gtt  HTN COPD tobacco use LUE dvt - on xarelto op. Hep gtt   LOS: 8 days   Eric Form, [facility] 07/09/2023, 9:12 AM Please see Amion for pager number during day hours 7:00am-4:30pm

## 2023-07-09 NOTE — Progress Notes (Signed)
   07/09/23 1249  TOC Brief Assessment  Insurance and Status Reviewed  Patient has primary care physician Yes  Home environment has been reviewed home alone  Prior level of function: independent  Prior/Current Home Services No current home services  Social Drivers of Health Review SDOH reviewed no interventions necessary  Readmission risk has been reviewed Yes  Transition of care needs no transition of care needs at this time

## 2023-07-09 NOTE — Discharge Instructions (Signed)

## 2023-07-10 DIAGNOSIS — K6389 Other specified diseases of intestine: Secondary | ICD-10-CM | POA: Diagnosis not present

## 2023-07-10 LAB — BASIC METABOLIC PANEL
Anion gap: 8 (ref 5–15)
BUN: 25 mg/dL — ABNORMAL HIGH (ref 8–23)
CO2: 24 mmol/L (ref 22–32)
Calcium: 8.9 mg/dL (ref 8.9–10.3)
Chloride: 104 mmol/L (ref 98–111)
Creatinine, Ser: 0.89 mg/dL (ref 0.44–1.00)
GFR, Estimated: >60 mL/min (ref >60)
Glucose, Bld: 130 mg/dL — ABNORMAL HIGH (ref 70–99)
Potassium: 4.4 mmol/L (ref 3.5–5.1)
Sodium: 136 mmol/L (ref 135–145)

## 2023-07-10 LAB — CBC
HCT: 26.8 % — ABNORMAL LOW (ref 36.0–46.0)
HCT: 25.6 % — ABNORMAL LOW (ref 36.0–46.0)
Hemoglobin: 8 g/dL — ABNORMAL LOW (ref 12.0–15.0)
Hemoglobin: 7.7 g/dL — ABNORMAL LOW (ref 12.0–15.0)
MCH: 24.2 pg — ABNORMAL LOW (ref 26.0–34.0)
MCH: 24.1 pg — ABNORMAL LOW (ref 26.0–34.0)
MCHC: 29.9 g/dL — ABNORMAL LOW (ref 30.0–36.0)
MCHC: 30.1 g/dL (ref 30.0–36.0)
MCV: 81.2 fL (ref 80.0–100.0)
MCV: 80.3 fL (ref 80.0–100.0)
Platelets: 552 10*3/uL — ABNORMAL HIGH (ref 150–400)
Platelets: 558 10*3/uL — ABNORMAL HIGH (ref 150–400)
RBC: 3.3 MIL/uL — ABNORMAL LOW (ref 3.87–5.11)
RBC: 3.19 MIL/uL — ABNORMAL LOW (ref 3.87–5.11)
RDW: 17.6 % — ABNORMAL HIGH (ref 11.5–15.5)
RDW: 17.7 % — ABNORMAL HIGH (ref 11.5–15.5)
WBC: 15.7 10*3/uL — ABNORMAL HIGH (ref 4.0–10.5)
WBC: 10.5 10*3/uL (ref 4.0–10.5)
nRBC: 0 % (ref 0.0–0.2)
nRBC: 0 % (ref 0.0–0.2)

## 2023-07-10 MED ORDER — BISACODYL 10 MG RE SUPP: 10.0000 mg | Freq: Two times a day (BID) | RECTAL | Status: DC | PRN

## 2023-07-10 MED ORDER — ALUM & MAG HYDROXIDE-SIMETH 200-200-20 MG/5ML PO SUSP: 30.0000 mL | Freq: Four times a day (QID) | ORAL | Status: DC | PRN

## 2023-07-10 MED ORDER — RIVAROXABAN 10 MG PO TABS
20.0000 mg | ORAL_TABLET | Freq: Every day | ORAL | Status: DC
  Filled 2023-07-10: qty 2

## 2023-07-10 MED ORDER — SODIUM CHLORIDE 0.9% FLUSH
3.0000 mL | Freq: Two times a day (BID) | INTRAVENOUS | Status: DC
  Administered 2023-07-10 – 2023-07-17 (×13): 3 mL via INTRAVENOUS

## 2023-07-10 MED ORDER — PHENOL 1.4 % MT LIQD: 2.0000 | OROMUCOSAL | Status: DC | PRN

## 2023-07-10 MED ORDER — METHOCARBAMOL 500 MG PO TABS: 1000.0000 mg | ORAL_TABLET | Freq: Four times a day (QID) | ORAL | Status: DC | PRN

## 2023-07-10 MED ORDER — PROCHLORPERAZINE EDISYLATE 10 MG/2ML IJ SOLN
5.0000 mg | Freq: Once | INTRAMUSCULAR | Status: AC | PRN
  Administered 2023-07-10: 5 mg via INTRAVENOUS
  Filled 2023-07-10: qty 2

## 2023-07-10 MED ORDER — SALINE SPRAY 0.65 % NA SOLN: 1.0000 | Freq: Four times a day (QID) | NASAL | Status: DC | PRN

## 2023-07-10 MED ORDER — MENTHOL 3 MG MT LOZG
1.0000 | LOZENGE | OROMUCOSAL | Status: DC | PRN
  Filled 2023-07-10: qty 9

## 2023-07-10 MED ORDER — MAGIC MOUTHWASH: 15.0000 mL | Freq: Four times a day (QID) | ORAL | Status: DC | PRN

## 2023-07-10 MED ORDER — SODIUM CHLORIDE 0.9 % IV SOLN: 250.0000 mL | INTRAVENOUS | Status: DC | PRN

## 2023-07-10 MED ORDER — METHOCARBAMOL 1000 MG/10ML IJ SOLN: 1000.0000 mg | Freq: Four times a day (QID) | INTRAMUSCULAR | Status: DC | PRN

## 2023-07-10 MED ORDER — NAPHAZOLINE-GLYCERIN 0.012-0.25 % OP SOLN: 1.0000 [drp] | Freq: Four times a day (QID) | OPHTHALMIC | Status: DC | PRN

## 2023-07-10 MED ORDER — CALCIUM POLYCARBOPHIL 625 MG PO TABS
625.0000 mg | ORAL_TABLET | Freq: Two times a day (BID) | ORAL | Status: DC
  Administered 2023-07-10 – 2023-07-17 (×13): 625 mg via ORAL
  Filled 2023-07-10 (×14): qty 1

## 2023-07-10 MED ORDER — SODIUM CHLORIDE 0.9% FLUSH: 3.0000 mL | INTRAVENOUS | Status: DC | PRN

## 2023-07-10 MED ORDER — SODIUM CHLORIDE 0.9 % IV SOLN: INTRAVENOUS | Status: AC

## 2023-07-10 MED ORDER — FERROUS SULFATE 325 (65 FE) MG PO TABS
325.0000 mg | ORAL_TABLET | Freq: Two times a day (BID) | ORAL | Status: DC
Start: 2023-07-10 — End: 2023-07-17
  Administered 2023-07-10 – 2023-07-16 (×13): 325 mg via ORAL
  Filled 2023-07-10 (×14): qty 1

## 2023-07-10 MED ORDER — SIMETHICONE 40 MG/0.6ML PO SUSP: 80.0000 mg | Freq: Four times a day (QID) | ORAL | Status: DC | PRN

## 2023-07-10 MED ORDER — LACTATED RINGERS IV BOLUS: 1000.0000 mL | Freq: Three times a day (TID) | INTRAVENOUS | Status: AC | PRN

## 2023-07-10 NOTE — Progress Notes (Signed)
PROGRESS NOTE    Tina Mccullough  WJX:[id number] DOB: Nov 04, 1950 DOA: 07/01/2023 PCP: [name], MD   Brief Narrative:  73 year old female history of lymphocytic colitis, COPD DVT PE hypertension recent admission on 11/28 for fall and DVT presented to the [facility] with 4 days of purple stool then some blood, abdominal pain on right side and had outpatient CT scan done that showed a mass and potentially some colitis and sent to the ED In the ED hemodynamically stable-initially tachycardic 117, labs with hypokalemia severe anemia hemoglobin 7.1> 6.3 g and admission was requested to unit PRBC was ordered on admission. CT scan abdomen with contrast 1/23: Obstructing masslike lesion in hepatic flexure suspicious for colon carcinoma dilated ascending colon with mild wall thickening suspicious for colitis mild right colonic wall thickening.   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 S.p 2 units PRBC-Hb stable so far. CT chest no evidence of metastasis. GI surgery following  -07/04/23 colonoscopy  -large obstructing and infiltrative mass in the proximal transverse colon/hepatic flexure but unable to advance the scope-Biopsy showed adenocarcinoma. -07/06/23 underwent right hemicolectomy surgical pathology:>Invasive moderately differentiated adenocarcinoma extending through submucosa, muscularis propria and into subserosal adipose tissue, see note.Margins negative.Unremarkable appendix. -Postop significant leukocytosis but afebrile,  -tolerating soft diet no BM yet.  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  Patient back on heparin postop,  monitor closely -Okay to change to Xarelto per General Surgery today however patient had bloody stool.  Will hold on Xarelto and check H&H stat.  COPD: -Not in exacerbation, cont IS.   Tobacco abuse: Continue nicotine patch  Hypokalemia Hypomagnesemia: Replaced po   DVT prophylaxis: SCD Code Status: Full code Family Communication:  None present at bedside.  Plan of care discussed with patient in length and he verbalized understanding and agreed with it. Disposition Plan: To be determined  Consultants:  GI General surgery Palliative  Procedures:  Colonoscopy Hemicolectomy  Antimicrobials:  None  Status is: Inpatient      Subjective: Patient seen and examined.  Reports overall she feels a lot better.  Swelling on her abdomen seems to be improving.  No nausea, vomiting.  Able to tolerate regular diet with no problems.  Passing gas but still had not had bowel movement.  This afternoon: Patient had bloody stool with blood clots.  I will hold on Xarelto and monitor H&H closely.  Objective: Vitals:   07/09/23 0547 07/09/23 1356 07/09/23 2140 07/10/23 0530  BP: (!) 144/85 (!) 154/76 112/75 (!) 142/88  Pulse: 91 (!) 104 99 (!) 108  Resp: 15 18 19 17   Temp: 97.7 F (36.5 C) 97.8 F (36.6 C) 98.5 F (36.9 C) 99 F (37.2 C)  TempSrc: Oral Oral Oral Oral  SpO2: 99% 96% 94% 93%  Weight:      Height:        Intake/Output Summary (Last 24 hours) at 07/10/2023 1202 Last data filed at 07/10/2023 0539 Gross per 24 hour  Intake 917.99 ml  Output 0 ml  Net 917.99 ml   Filed Weights   07/02/23 1100 07/06/23 1022  Weight: 65.4 kg 61.2 kg    Examination:  General exam: Appears  calm and comfortable, on room air, communicating well Respiratory system: Clear to auscultation. Respiratory effort normal. Cardiovascular system: S1 & S2 heard, RRR. No JVD, murmurs, rubs, gallops or clicks. No pedal edema. Gastrointestinal system: Abdomen is nondistended, soft and nontender.   Midline surgical dressing dry and intact.   Central nervous system: Alert and oriented. No focal neurological deficits. Extremities: Symmetric 5 x 5 power. Skin: No rashes, lesions or ulcers Psychiatry: Judgement and insight appear normal. Mood & affect appropriate.    Data Reviewed: I have personally reviewed following labs and imaging studies  CBC: Recent Labs  Lab 07/06/23 0321 07/07/23 0533 07/08/23 0731 07/09/23 0434 07/10/23 0529  WBC 8.3 20.8* 24.2* 13.9* 15.7*  HGB 8.1* 8.7* 8.0* 7.6* 8.0*  HCT 25.7* 28.1* 26.5* 24.5* 26.8*  MCV 80.3 80.1 80.8 79.8* 81.2  PLT 417* 427* 456* 483* 552*   Basic Metabolic Panel: Recent Labs  Lab 07/06/23 0321 07/07/23 0533 07/08/23 0731 07/09/23 0434 07/10/23 0529  NA 137 137 133* 133* 136  K 3.5 3.4* 3.3* 3.8 4.4  CL 105 102 98 104 104  CO2 26 26 25 24 24   GLUCOSE 92 105* 94 106* 130*  BUN 9 11 16 21  25*  CREATININE 0.54 0.83 0.84 0.69 0.89  CALCIUM 8.6* 8.6* 8.4* 8.3* 8.9  MG  --  1.6* 2.1 1.8  --    GFR: Estimated Creatinine Clearance: 52.9 mL/min (by C-G formula based on SCr of 0.89 mg/dL). Liver Function Tests: No results for input(s): "AST", "ALT", "ALKPHOS", "BILITOT", "PROT", "ALBUMIN" in the last 168 hours. No results for input(s): "LIPASE", "AMYLASE" in the last 168 hours. No results for input(s): "AMMONIA" in the last 168 hours. Coagulation Profile: No results for input(s): "INR", "PROTIME" in the last 168 hours. Cardiac Enzymes: No results for input(s): "CKTOTAL", "CKMB", "CKMBINDEX", "TROPONINI" in the last 168 hours. BNP (last 3 results) No results for input(s): "PROBNP" in the last 8760 hours. HbA1C: No results for input(s): "HGBA1C" in the last 72 hours. CBG: No results for input(s): "GLUCAP" in the last 168 hours. Lipid Profile: No results for input(s): "CHOL", "HDL", "LDLCALC", "TRIG", "CHOLHDL", "LDLDIRECT" in the last 72 hours. Thyroid Function Tests: No results for input(s): "TSH", "T4TOTAL",  "FREET4", "T3FREE", "THYROIDAB" in the last 72 hours. Anemia Panel: No results for input(s): "VITAMINB12", "FOLATE", "FERRITIN", "TIBC", "IRON", "RETICCTPCT" in the last 72 hours. Sepsis Labs: No results for input(s): "PROCALCITON", "LATICACIDVEN" in the last 168 hours.  No results found for this or any previous visit (from the past 240 hours).    Radiology Studies: No results found.  Scheduled Meds:  acetaminophen  1,000 mg Oral Q6H   feeding supplement  237 mL Oral BID BM   ferrous sulfate  325 mg Oral BID WC   gabapentin  300 mg Oral TID   methocarbamol  500 mg Oral TID   pantoprazole  40 mg Oral Daily   polycarbophil  625 mg Oral BID   sodium chloride flush  3 mL Intravenous Q12H   Continuous Infusions:  sodium chloride     lactated ringers       LOS: 9 days   Time spent: 35 minutes   Dariyon Urquilla Estill Cotta, MD [facility]  If 7PM-7AM, please contact night-coverage [email or web address] 07/10/2023, 12:02 PM

## 2023-07-10 NOTE — Plan of Care (Signed)

## 2023-07-10 NOTE — Progress Notes (Signed)
07/10/2023  Tina Mccullough [id number] Jan 27, 1951  CARE TEAM: PCP: Tina Raider, MD  Outpatient Care Team: Patient Care Team: Tina Raider, MD as PCP - General (Family Medicine) Tina Harder, MD (Inactive) as Consulting Physician (Dermatology)  Inpatient Treatment Team: Treatment Team:  Tina Bowl, MD Ccs, Md, MD Tina Bills, DO Tina Santiago, MD Tina Mccullough, NT Tina Maroon, RN Tina Mccullough, [facility]   Problem List:   Principal Problem:   Colonic mass Active Problems:   DVT (deep venous thrombosis) [facility])   Essential hypertension   Tobacco abuse   COPD (chronic obstructive pulmonary disease) (HCC)   Hypokalemia   Acute posthemorrhagic anemia   Palliative care encounter   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  POD#4 s/p open R hemicolectomy 1/28 Dr. Fredricka Mccullough   -colonoscopy 1/26 Dr. Doretha Mccullough -proximal transverse colon mass biopsy c/w adenocarcinoma -CEA elevated to 71.3, CT [facility] without met disease - surgical path c/w pT3pN0 adenocarcinoma - Dr Tina Mccullough d/w patient. She has oncology follow up - afebrile, WBC down to 13.9. Hold off on further abx at this time -Benign abdominal exam, having bowel function.  Wearing soft diet.  Advance. - hgb 8 low but relatively stable on full anticoagulation with Lovenox shots.  Okay to switch to oral anticoagulation.  Consider oral and IV iron replacement. - encouraged mobilization and IS - oncology has been consulted -  supplement K PO and mag IV   FEN: soft ID: ancef periop VTE: hep gtt   HTN COPD tobacco use LUE dvt - on xarelto op. Hep gtt   -Disposition: Patient doing much better with flatus.  Most likely can discharge later today or tomorrow if tolerates solid diet and has persistent good bowel function.       I reviewed nursing notes, hospitalist notes, last 24 h vitals and pain scores, last 48 h intake and output, last 24 h labs and trends, and last 24 h imaging results.  I have reviewed this patient's available data, including medical history, events of note, test results, etc as part of my evaluation.   A significant portion of that time was spent in counseling. Care during the described time interval was provided by me.  This care required moderate level of medical decision making.  07/10/2023    Subjective: (Chief complaint)  Feeling better overall.  Tolerating soft diet.  Starting to pass some gas.  No bowel movement.  Mild crampiness but nothing too severe.  No rectal bleeding.  Objective:  Vital signs:  Vitals:   07/09/23 0547 07/09/23 1356 07/09/23 2140 07/10/23 0530  BP: (!) 144/85 (!) 154/76 112/75 (!) 142/88  Pulse: 91 (!) 104 99 (!) 108  Resp: 15 18 19 17   Temp: 97.7 F (36.5 C) 97.8 F (36.6 C) 98.5 F (36.9 C) 99 F (37.2 C)  TempSrc: Oral Oral Oral Oral  SpO2: 99% 96% 94% 93%  Weight:      Height:        Last BM Date : 07/08/23  Intake/Output  Yesterday:  01/31 0701 - 02/01 0700 In: 1497.7 [P.O.:1080; I.V.:367.7; IV Piggyback:50] Out: 0  This shift:  No intake/output data recorded.  Bowel function:  Flatus: YES  BM:  No  Drain: (No drain)   Physical Exam:  General: Pt awake/alert in no acute distress Eyes: PERRL, normal EOM.  Sclera clear.  No icterus Neuro: CN II-XII intact w/o focal sensory/motor deficits. Lymph: No head/neck/groin lymphadenopathy Psych:  No delerium/psychosis/paranoia.  Oriented x 4 HENT: Normocephalic, Mucus  membranes moist.  No thrush Neck: Supple, No tracheal deviation.  No obvious thyromegaly Chest: No pain to chest wall compression.  Good respiratory excursion.  No audible wheezing CV:  Pulses intact.  Regular rhythm.  No major extremity edema MS: Normal AROM mjr joints.  No obvious deformity  Abdomen: Soft.  Mildy distended.  Mildly tender at incisions only.  No evidence of peritonitis.  No incarcerated hernias.  Ext:   No deformity.  No mjr edema.  No cyanosis Skin: No petechiae / purpurea.  No major sores.  Warm and dry    Results:    Results Surgical pathology (Order [id number]) MyChart Results Release  MyChart Status: Active (w/ proxy users)  Results Release   Surgical pathology Order: [id number]  Status: Edited Result - FINAL     Next appt: None   Test Result Released: Yes (not seen)   0 Result Notes    Component Ref Range & Units (hover) 4 d ago  SURGICAL PATHOLOGY SURGICAL PATHOLOGY * THIS IS AN ADDENDUM REPORT * CASE: [id number] PATIENT: Tina Mccullough Surgical Pathology Report *Addendum *  Reason for Addendum #1:  DNA Mismatch Repair IHC Results  Clinical History: Colon Cancer ([place])     FINAL MICROSCOPIC DIAGNOSIS:  A. COLON, RIGHT, RESECTION: -  Invasive moderately differentiated adenocarcinoma extending through submucosa, muscularis propria and into subserosal adipose tissue, see note. -  Margins negative. -  Unremarkable appendix.  Note: Grossly and microscopically it is noted that omentum is adherent to the serosal surface of the colon.  Tumor extends into subserosal adipose tissue; however, while suspicious, definitive involvement and/or perforation through the serosa to involve the omentum by tumor is not definitively identified microscopically.  There are areas of colon that show attenuation and transmural necrosis with evidence of acute serositis and imminent perforation likely secondary to ischemia and obstruction.  This acute  serositis could be the etiology for the adherent omentum as opposed to direct tumor extension/preparation of the serosa. This is therefore pathologically staged as pT3 as opposed to pT4.  ONCOLOGY TABLE:    COLON AND RECTUM, CARCINOMA:  Resection, Including Transanal Disk Excision of Rectal Neoplasms  Procedure: Right hemicolectomy Tumor Site: Hepatic flexure Tumor Size: 7.5 x 6.0 x 2.0 cm Macroscopic Tumor Perforation: Equivocal, see note Macroscopic Evaluation of Mesorectum (required for rectal cancer): N/A Histologic Type: Adenocarcinoma with mucinous features (insufficient mucin for diagnosis of mucinous adenocarcinoma) Histologic Grade: Moderately differentiated Multiple Primary Sites: N/A Tumor Extension: Tumor extends through the submucosa, muscularis propria and into subserosal tissue.  It approaches the serosa and it is noted that omentum is adherent to the serosal surface; however, definitive microscopic evidence of either serosal involvement or extension into omentum, while suspicious, is not definitively identified Lymphovascular Invasion: Not identified Perineural Invasion: Not identified Treatment Effect: No known presurgical therapy Margins:      Margin Status for Invasive Carcinoma: All margins negative for invasive carcinoma      Distance from Invasive Carcinoma to Radial (Circumferential) Margin (required for rectal  tumors): N/A      Distance from Invasive Carcinoma to Closest Mucosal Margin (relevant and required only for           transanal disc excisions): N/A      Margin Status for Non-Invasive Tumor: All margins negative for adenomatous epithelium Regional Lymph Nodes:      Number of Lymph Nodes with Tumor: 0      Number of Lymph Nodes Examined: 31 Tumor Deposits: Not identified Distant Metastasis:      Distant Site(s) Involved: N/A Pathologic Stage Classification (pTNM, AJCC 8th Edition): pT3, pN0 Ancillary Studies: MMR/MSI testing will  be ordered and reported in an addendum Representative Tumor Block: A3 through A7 Comments: (v4.2.0.1)   Donnell Wion DESCRIPTION:  Specimen: Right colon, received fresh Specimen integrity: Intact Specimen length: The specimen consists of 7 cm of terminal ileum and 32 cm of colon. Tumor location: Clinically hepatic flexure Tumor size: The tumor consists of a sessile, ulcerated tan-red circumferential mass 7.5 cm in circumference, 6 cm in length and up to 2 cm in thickness.  There is omentum adherent to the serosa at the tumor. Percent of bowel circumference involved: 100% Tumor distance to margins:                      Proximal: 27 cm                      Distal: 12 cm                      Mesenteric (sigmoid and transverse): 3.5 cm Macroscopic extent of tumor invasion: The tumor involves the full-thickness of the wall and focally extends into the adjacent soft tissue. Total presumed lymph nodes: There are 30 tan-yellow ovoid nodules grossly consistent with lymph nodes measuring 0.3 to 1 cm. Extramural satellite tumor nodules: Not grossly identified. Mucosal polyp(s): Not grossly identified. Additional findings: The uninvolved mucosa is glistening and tan.  The lumen proximal to the tumor is dilated up to 7 cm in diameter.  In the proximal ascending colon there is a 1.5 cm area covered by tan-yellow exudate.  The wall at the site is thin and focally hemorrhagic.  The appendix is present and measures 10.2 cm in length and 0.7 cm in diameter. Block summary: 17 blocks submitted 1 = proximal margin 2 = distal margin 3 = tumor with proximal uninvolved mucosa transition 4 = tumor with distal uninvolved mucosa transition 5 = tumor with inked serosa and adherent omentum 6, 7 = tumor 8 = proximal mucosa with exudate 9 = appendix 10-13 = 26 whole lymph nodes 14-17 = 1 sectioned lymph node each (GRP 07/07/2023)    Final Diagnosis performed by Orene Desanctis DO.   Electronically  signed 07/08/2023 Technical and / or Professional components performed at [facility], [address] [place], [place] [address].  Immunohistochemistry Technical component (if applicable) was performed at [facility]. [address], [address], [place], [place] [address].   IMMUNOHISTOCHEMISTRY DISCLAIMER (if applicable): Some of these immunohistochemical stains may have been developed and the performance characteristics determine by [facility]. Some may not have been cleared or approved by the U.S. Food and Drug Administration. The FDA has determined that such clearance or approval is not necessary. This test is used for clinical purposes. It should not be regarded as investigational or for research. This laboratory is certified under the Clinical Laboratory Improvement Amendments of 1988 (  CLIA-88) as qualified to perform high complexity clinical laboratory testing.  The controls stained appropriately.   IHC stains are performed on formalin fixed, paraffin embedded tissue using a 3,3"diaminobenzidine (DAB) chromogen and Leica Bond Autostainer System. The staining intensity of the nucleus is score manually and is reported as the percentage of tumor cell nuclei demonstrating specific nuclear staining. The specimens are fixed in 10% Neutral Formalin for at least 6 hours and up to 72hrs. These tests are validated on decalcified tissue. Results should be interpreted with caution given the possibility of false negative results on decalcified specimens. Antibody Clones are as follows ER-clone 37F, PR-clone 16, Ki67- clone MM1. Some of these immunohistochemical stains may have been developed and the performance characteristics determined by [facility].  ADDENDUM: Mismatch Repair Protein (IHC)  SUMMARY INTERPRETATION: ABNORMAL  There is loss of the major and minor MMR proteins MLH1 and PMS2. The loss of expression may be secondary  to promoter hyper-methylation, gene mutation or other genetic event. BRAF mutation testing and/or MLH1 methylation testing is indicated. The presence of a BRAF mutation and/or MLH1 hypermethylation is indicative of a sporadic-type tumor. The absence of either BRAF mutation and/or presence of normal methylation indicate the possible presence of a hereditary germline mutation (e.g. Lynch syndrome) and referral to genetic counseling is warranted. It is recommended that the loss of protein expression be correlated with molecular based MSI testing.  IHC EXPRESSION RESULTS TEST           RESULT MLH1:          LOSS OF NUCLEAR EXPRESSION MSH2:          Preserved nuclear expression MSH6:          Preserved nuclear expression PMS2:          LOSS OF NUCLEAR EXPRESSION  References: 1. Guidelines on Genetic Evaluation and Management of Lynch Syndrome: A Consensus Statement by the [place] Multi-Society Task Force on Colorectal Cancer Stana Bunting. Ileana Roup , MD, and other . Am Katheren Puller 2014; [phone]; doi: 10.1038/ajg.2014.186; published online 27 December 2012 2. Outcomes of screening endometrial cancer patients for Lynch syndrome by patient-administered checklist. Garner Nash MS, and others. Gynecol Oncol 2013;131(3):619-623. 3. Muir-Torre syndrome (MTS): An update and approach to diagnosis and management. Kym Groom, MD and others. J Am Acad Dermatol [phone].  Addendum #1 performed by Jimmy Picket, MD.   Electronically signed 07/09/2023 Technical component performed at [facility], [address], [place], [place] [address].  Professional component performed at [employer]. [facility], [address], [place], [place] [address].  Immunohistochemistry Technical component (if applicable) was performed at [facility]. [address], [place], [place] [address].   IMMUNOHISTOCHEMISTRY DISCLAIMER (if applicable): Some of these  immunohistochemical stains may have been developed and the performance characteristics determine by [facility]. Some may not have been cleared or approved by the U.S. Food and Drug Administration. The FDA has determined that such clearance or approval is not necessary. This test is used for clinical purposes. It should not be regarded as investigational or for research. This laboratory is certified under the Clinical Laboratory Improvement Amendments of 1988 (CLIA-88) as qualified to perform high complexity clinical laboratory testing.  The controls stained appropriately.   IHC stains are performed on formalin fixed, paraffin embedded tissue using a 3,3"diaminobenzidine (DAB) chromogen and Leica Bond Autostainer System. The staining intensity of the nucleus is score manually and is reported as the percentage of tumor cell nuclei  demonstrating specific nuclear staining. The specimens are fixed in 10% Neutral Formalin for at least 6 hours and up to 72hrs. These tests are validated on decalcified tissue. Results should be interpreted with caution given the possibility of false negative results on decalcified specimens. Antibody Clones are as follows ER-clone 70F, PR-clone 16, Ki67- clone MM1. Some of these immunohistochemical stains may have been developed and the performance characteristics determined by [facility].  Resulting Agency [facility]            Cultures: No results found for this or any previous visit (from the past 720 hours).  Labs: Results for orders placed or performed during the hospital encounter of 07/01/23 (from the past 48 hours)  CBC     Status: Abnormal   Collection Time: 07/09/23  4:34 AM  Result Value Ref Range   WBC 13.9 (H) 4.0 - 10.5 K/uL   RBC 3.07 (L) 3.87 - 5.11 MIL/uL   Hemoglobin 7.6 (L) 12.0 - 15.0 g/dL   HCT 52.8 (L) 41.3 - 24.4 %   MCV 79.8 (L) 80.0 - 100.0 fL   MCH 24.8 (L) 26.0 - 34.0 pg   MCHC 31.0 30.0 - 36.0 g/dL    RDW 01.0 (H) 27.2 - 15.5 %   Platelets 483 (H) 150 - 400 K/uL   nRBC 0.0 0.0 - 0.2 %    Comment: Performed at [facility], [address], [place], [place] [address]  Basic metabolic panel     Status: Abnormal   Collection Time: 07/09/23  4:34 AM  Result Value Ref Range   Sodium 133 (L) 135 - 145 mmol/L   Potassium 3.8 3.5 - 5.1 mmol/L   Chloride 104 98 - 111 mmol/L   CO2 24 22 - 32 mmol/L   Glucose, Bld 106 (H) 70 - 99 mg/dL    Comment: Glucose reference range applies only to samples taken after fasting for at least 8 hours.   BUN 21 8 - 23 mg/dL   Creatinine, Ser 4.03 0.44 - 1.00 mg/dL   Calcium 8.3 (L) 8.9 - 10.3 mg/dL   GFR, Estimated >47 >42 mL/min    Comment: (NOTE) Calculated using the CKD-EPI Creatinine Equation (2021)    Anion gap 5 5 - 15    Comment: Performed at [facility], [address], [place], [place] [address]  Magnesium     Status: None   Collection Time: 07/09/23  4:34 AM  Result Value Ref Range   Magnesium 1.8 1.7 - 2.4 mg/dL    Comment: Performed at [facility], [address], [place], [place] [address]  Heparin level (unfractionated)     Status: Abnormal   Collection Time: 07/09/23  4:34 AM  Result Value Ref Range   Heparin Unfractionated <0.10 (L) 0.30 - 0.70 IU/mL    Comment: (NOTE) The clinical reportable range upper limit is being lowered to >1.10 to align with the FDA approved guidance for the current laboratory assay.  If heparin results are below expected values, and patient dosage has  been confirmed, suggest follow up testing of antithrombin III levels. Performed at [facility], [address] [place], [place] [address]   CBC     Status: Abnormal   Collection Time: 07/10/23  5:29 AM  Result Value Ref Range   WBC 15.7 (H) 4.0 - 10.5 K/uL   RBC 3.30 (L) 3.87 - 5.11 MIL/uL   Hemoglobin 8.0 (L) 12.0 - 15.0 g/dL  HCT 26.8 (L) 36.0 - 46.0 %   MCV 81.2 80.0 -  100.0 fL   MCH 24.2 (L) 26.0 - 34.0 pg   MCHC 29.9 (L) 30.0 - 36.0 g/dL   RDW 24.4 (H) 01.0 - 27.2 %   Platelets 552 (H) 150 - 400 K/uL   nRBC 0.0 0.0 - 0.2 %    Comment: Performed at [facility], [address], [place], [place] [address]  Basic metabolic panel     Status: Abnormal   Collection Time: 07/10/23  5:29 AM  Result Value Ref Range   Sodium 136 135 - 145 mmol/L   Potassium 4.4 3.5 - 5.1 mmol/L   Chloride 104 98 - 111 mmol/L   CO2 24 22 - 32 mmol/L   Glucose, Bld 130 (H) 70 - 99 mg/dL    Comment: Glucose reference range applies only to samples taken after fasting for at least 8 hours.   BUN 25 (H) 8 - 23 mg/dL   Creatinine, Ser 4.03 0.44 - 1.00 mg/dL   Calcium 8.9 8.9 - 47.4 mg/dL   GFR, Estimated >25 >95 mL/min    Comment: (NOTE) Calculated using the CKD-EPI Creatinine Equation (2021)    Anion gap 8 5 - 15    Comment: Performed at [facility], [address] [place], [place] [address]    Imaging / Studies: No results found.  Medications / Allergies: per chart  Antibiotics: Anti-infectives (From admission, onward)    Start     Dose/Rate Route Frequency Ordered Stop   07/06/23 0945  ceFAZolin (ANCEF) IVPB 2g/100 mL premix        2 g 200 mL/hr over 30 Minutes Intravenous On call to O.R. 07/06/23 0853 07/06/23 1221         Note: Portions of this report may have been transcribed using voice recognition software. Every effort was made to ensure accuracy; however, inadvertent computerized transcription errors may be present.   Any transcriptional errors that result from this process are unintentional.    Ardeth Sportsman, MD, FACS, MASCRS Esophageal, Gastrointestinal & Colorectal Surgery Robotic and Minimally Invasive Surgery  [facility] [address], [address] [place], [place] [address] [phone] Fax [phone] Main  CONTACT INFORMATION: Weekday  (9AM-5PM): Call [facility] main office at [phone] Weeknight (5PM-9AM) or Weekend/Holiday: Check EPIC "Web Links" tab & use "AMION" (password " [email or web address]") for General Surgery [facility] coverage  Please, DO NOT use SecureChat  (it is not reliable communication to reach operating surgeons & will lead to a delay in care).   Epic staff messaging available for outptient concerns needing 1-2 business day response.      07/10/2023  8:35 AM

## 2023-07-10 NOTE — Progress Notes (Signed)
Patient complained of throat pain/ soreness. Offered throat spray, wanted lozanges. MD on call notified. Awaiting new orders.

## 2023-07-10 NOTE — Progress Notes (Signed)
Patient complained of nausea, pain in her throat, being unable to take PO's well because of the pain, and generally not feeling well at this time. PRN medication given. MD on call notified as well, new orders received. CN also made aware. Plan of care on going.

## 2023-07-10 NOTE — Progress Notes (Signed)
I woke patient up to attempt to give 2200 medications. Patient refused stated, " my mouth is so dry and I don't think I can swallow the pills". MD on call made aware. Plan of care ongoing.

## 2023-07-11 DIAGNOSIS — C189 Malignant neoplasm of colon, unspecified: Secondary | ICD-10-CM | POA: Diagnosis not present

## 2023-07-11 LAB — CBC
HCT: 24.7 % — ABNORMAL LOW (ref 36.0–46.0)
Hemoglobin: 7.6 g/dL — ABNORMAL LOW (ref 12.0–15.0)
MCH: 24.6 pg — ABNORMAL LOW (ref 26.0–34.0)
MCHC: 30.8 g/dL (ref 30.0–36.0)
MCV: 79.9 fL — ABNORMAL LOW (ref 80.0–100.0)
Platelets: 523 10*3/uL — ABNORMAL HIGH (ref 150–400)
RBC: 3.09 MIL/uL — ABNORMAL LOW (ref 3.87–5.11)
RDW: 17.9 % — ABNORMAL HIGH (ref 11.5–15.5)
WBC: 7.9 10*3/uL (ref 4.0–10.5)
nRBC: 0 % (ref 0.0–0.2)

## 2023-07-11 LAB — BASIC METABOLIC PANEL
Anion gap: 8 (ref 5–15)
BUN: 28 mg/dL — ABNORMAL HIGH (ref 8–23)
CO2: 24 mmol/L (ref 22–32)
Calcium: 8.6 mg/dL — ABNORMAL LOW (ref 8.9–10.3)
Chloride: 104 mmol/L (ref 98–111)
Creatinine, Ser: 0.67 mg/dL (ref 0.44–1.00)
GFR, Estimated: >60 mL/min (ref >60)
Glucose, Bld: 108 mg/dL — ABNORMAL HIGH (ref 70–99)
Potassium: 3.9 mmol/L (ref 3.5–5.1)
Sodium: 136 mmol/L (ref 135–145)

## 2023-07-11 MED ORDER — MAGIC MOUTHWASH
15.0000 mL | Freq: Three times a day (TID) | ORAL | Status: AC
  Administered 2023-07-11 (×3): 15 mL via ORAL
  Filled 2023-07-11 (×7): qty 15

## 2023-07-11 MED ORDER — METHOCARBAMOL 500 MG PO TABS: 500.0000 mg | ORAL_TABLET | Freq: Four times a day (QID) | ORAL | Status: DC | PRN

## 2023-07-11 MED ORDER — SODIUM CHLORIDE 0.9 % IV SOLN
100.0000 mg | Freq: Once | INTRAVENOUS | Status: AC
  Administered 2023-07-11: 100 mg via INTRAVENOUS
  Filled 2023-07-11: qty 5

## 2023-07-11 MED ORDER — PROCHLORPERAZINE EDISYLATE 10 MG/2ML IJ SOLN
5.0000 mg | Freq: Four times a day (QID) | INTRAMUSCULAR | Status: AC | PRN
Start: 2023-07-11 — End: 2023-07-11
  Administered 2023-07-11: 5 mg via INTRAVENOUS
  Filled 2023-07-11: qty 2

## 2023-07-11 MED ORDER — VITAMIN C 500 MG PO TABS
500.0000 mg | ORAL_TABLET | Freq: Every day | ORAL | Status: DC
  Administered 2023-07-11 – 2023-07-17 (×7): 500 mg via ORAL
  Filled 2023-07-11 (×7): qty 1

## 2023-07-11 MED ORDER — ADULT MULTIVITAMIN W/MINERALS CH
1.0000 | ORAL_TABLET | Freq: Every day | ORAL | Status: DC
  Administered 2023-07-12 – 2023-07-17 (×6): 1 via ORAL
  Filled 2023-07-11 (×7): qty 1

## 2023-07-11 MED ORDER — MAGIC MOUTHWASH
15.0000 mL | Freq: Once | ORAL | Status: DC
  Filled 2023-07-11: qty 15

## 2023-07-11 NOTE — Plan of Care (Signed)

## 2023-07-11 NOTE — Progress Notes (Addendum)
07/11/2023  Trista Ciocca Chivers [id number] 1950-09-26  CARE TEAM: PCP: [name], MD  Outpatient Care Team: Patient Care Team: [name], MD as PCP - General (Family Medicine) Janalyn Harder, MD (Inactive) as Consulting Physician (Dermatology)  Inpatient Treatment Team: Treatment Team:  Ollen Bowl, MD Ccs, Md, MD Alena Bills, DO Lawrence Santiago, MD Johnny Bridge, LPN Misty Stanley, NT Clydia Llano, RN Sherian Maroon, RN   Problem List:   Principal Problem:   Colonic mass Active Problems:   DVT (deep venous thrombosis) 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   s/p open R hemicolectomy 1/28 Dr. Fredricka Bonine   -colonoscopy 1/26 Dr. Doretha Imus -proximal transverse colon mass biopsy c/w adenocarcinoma  -CEA elevated to 71.3, CT C/A/P without metastatic disease  - surgical path c/w pT3pN0 adenocarcinoma - Dr Fredricka Bonine d/w patient. She has oncology follow up  - afebrile, WBC normalized.  No need for antibiotics from surgery standpoint  -Sounds like patient is having some odynophagia.  I see no strong evidence of thrush but patient tells me someone else said she did.  I see nothing ordered.  Will make the Magic mouthwash scheduled for the next 48 hours since that contains nystatin and sees if  that helps.  Defer to primary service they feel something more aggressive needs to happen.  Will back off to full liquid diet today since she is hesitant to try solid food again.  See if we can be advanced.  Acute postoperative anemia on top of iron deficiency anemia with bleeding transverse colon cancer.    Primary service held heparin drip past day.  Hematochezia while fully anticoagulated.  That is not surprising.    I see no major drop in her hemoglobin but it is borderline low.      I wrote for some IV iron.  Try to encourage her to take some oral iron with vitamin C to see if that would help as well.    If hypotensive or hemoglobin falls below 7, consider transfusion.  Pain control.   Patient worried the gabapentin was making her feel more loopy.  I canceled it.   Made her Robaxin just PRN (as needed) for now instead of scheduled.   Scheduled Tylenol.   IV Dilaudid is all she will take for now.   Worries the oxycodone does not agree with her but she has not taken anything in over 24 hours so I am skeptical that is a cause with her new concerns.    If bowel function improves, try and transition to better oral regimen.  Follow-up.  - encouraged mobilization and IS - oncology has been consulted - supplement K PO and mag IV   FEN:  ID: periop VTE: hep gtt   HTN COPD tobacco use LUE dvt -holding anticoagulation with hematochezia.  Defer to primary  service.  Consider restarting 2/3 if hemoglobin stable and no worsening bleed today.   Patient seen very anxious and defensive today.  Interrupting quite often.  I tried to calm her down and reassure her I was on her side.  She was tearful but partially consolable.  I noted my plans and recommendations and tried to explain some of these findings are not surprising in her situation and should be temporary.  My plans to address her concerns and have our diligence to continue to follow closely and try and treat things.  She seemed  mostly reassured.   I reviewed nursing notes, hospitalist notes, last 24 h vitals and pain scores, last 48 h intake and output, last 24 h labs and trends, and last 24 h imaging results.  I have reviewed this patient's available data, including medical history, events of note, test results, etc as part of my evaluation.   A significant portion of that time was spent in counseling. Care during the described time interval was provided by me.  This care required moderate level of medical decision making.  07/11/2023    Subjective: (Chief complaint)  Had bloody bowel movements which made her nervous.  Anticoagulation held.  Patient having pain with swallowing pills and eating and has been to try.  Patient tells me someone told her she had thrush.  Some abdominal pain mainly at her incision.  No nausea or vomiting.  Disheartened.  Objective:  Vital signs:  Vitals:   07/10/23 0530 07/10/23 1313 07/10/23 2152 07/11/23 0609  BP: (!) 142/88 (!) 145/82 130/84 (!) 156/69  Pulse: (!) 108 97 (!) 110 95  Resp: 17 20 19 16   Temp: 99 F (37.2 C) 98.6 F (37 C) 99 F (37.2 C) 98.3 F (36.8 C)  TempSrc: Oral Oral Oral Oral  SpO2: 93% 93% 93% 92%  Weight:      Height:        Last BM Date : 07/10/23  Intake/Output   Yesterday:  02/01 0701 - 02/02 0700 In: 600 [P.O.:600] Out: -  This shift:  No intake/output data recorded.  Bowel function:  Flatus: YES  BM:  YES  Drain: (No drain)   Physical Exam:  General: Pt awake/alert in no acute distress. Eyes: PERRL, normal EOM.  Sclera clear.  No icterus Neuro: CN II-XII intact w/o focal sensory/motor deficits.  No facial droop.  No difficulty speaking. Lymph: No head/neck/groin lymphadenopathy Psych:  No delerium/psychosis/paranoia.  Oriented x 4.  Nervous/anxious.  Interrupting often but no pressured speech. HENT: Normocephalic, Mucus membranes moist.  No thrush to my exam Neck: Supple, No tracheal deviation.  No obvious  thyromegaly Chest: No pain to chest wall compression.  Good respiratory excursion.  No audible wheezing CV:  Pulses intact.  Regular rhythm.  No major extremity edema MS: Normal AROM mjr joints.  No obvious deformity  Abdomen: Soft.  Nondistended.  Incision c/d/I.  Mildly tender at incisions only.  No evidence of peritonitis.  No incarcerated hernias.  Ext:   No deformity.  No mjr edema.  No cyanosis Skin: No petechiae / purpurea.  No major sores.  Warm and dry    Results:    Results Surgical pathology (Order [id number]) MyChart Results Release  MyChart Status: Active (w/ proxy users)  Results Release   Surgical pathology Order: [id number]  Status: Edited Result - FINAL     Next appt: None   Test Result Released: Yes (not seen)   0 Result Notes    Component  Ref Range & Units (hover) 4 d ago  SURGICAL PATHOLOGY SURGICAL PATHOLOGY * THIS IS AN ADDENDUM REPORT * CASE: [id number] PATIENT: [name] Surgical Pathology Report *Addendum *  Reason for Addendum #1:  DNA Mismatch Repair IHC Results  Clinical History: Colon Cancer ([place])     FINAL MICROSCOPIC DIAGNOSIS:  A. COLON, RIGHT, RESECTION: -  Invasive moderately differentiated adenocarcinoma extending through submucosa, muscularis propria and into subserosal adipose tissue, see note. -  Margins negative. -  Unremarkable appendix.  Note: Grossly and microscopically it is noted that omentum is adherent to the serosal surface of the colon.  Tumor extends into subserosal adipose tissue; however, while suspicious, definitive involvement and/or perforation through the serosa to involve the omentum by tumor is not definitively identified microscopically.  There are areas of colon that show attenuation and transmural necrosis with evidence of acute serositis and imminent perforation likely secondary to ischemia and obstruction.  This acute serositis could be the etiology for the adherent omentum as opposed to  direct tumor extension/preparation of the serosa. This is therefore pathologically staged as pT3 as opposed to pT4.  ONCOLOGY TABLE:    COLON AND RECTUM, CARCINOMA:  Resection, Including Transanal Disk Excision of Rectal Neoplasms  Procedure: Right hemicolectomy Tumor Site: Hepatic flexure Tumor Size: 7.5 x 6.0 x 2.0 cm Macroscopic Tumor Perforation: Equivocal, see note Macroscopic Evaluation of Mesorectum (required for rectal cancer): N/A Histologic Type: Adenocarcinoma with mucinous features (insufficient mucin for diagnosis of mucinous adenocarcinoma) Histologic Grade: Moderately differentiated Multiple Primary Sites: N/A Tumor Extension: Tumor extends through the submucosa, muscularis propria and into subserosal tissue.  It approaches the serosa and it is noted that omentum is adherent to the serosal surface; however, definitive microscopic evidence of either serosal involvement or extension into omentum, while suspicious, is not definitively identified Lymphovascular Invasion: Not identified Perineural Invasion: Not identified Treatment Effect: No known presurgical therapy Margins:      Margin Status for Invasive Carcinoma: All margins negative for invasive carcinoma      Distance from Invasive Carcinoma to Radial (Circumferential) Margin (required for rectal           tumors): N/A      Distance from Invasive Carcinoma to Closest Mucosal Margin (relevant and required only for           transanal disc excisions): N/A      Margin Status for Non-Invasive Tumor: All margins negative for adenomatous epithelium Regional Lymph Nodes:      Number of Lymph Nodes with Tumor: 0      Number of Lymph Nodes Examined: 31 Tumor Deposits: Not identified Distant Metastasis:      Distant Site(s) Involved: N/A Pathologic Stage Classification (pTNM, AJCC 8th Edition): pT3, pN0 Ancillary Studies: MMR/MSI testing will be ordered and reported in an addendum Representative Tumor Block: A3  through A7 Comments: (v4.2.0.1)   Pearle Wandler DESCRIPTION:  Specimen: Right colon, received fresh Specimen integrity: Intact Specimen length: The specimen consists of 7 cm of terminal ileum and 32 cm of colon. Tumor location: Clinically hepatic flexure Tumor size: The tumor consists of a sessile, ulcerated tan-red circumferential mass 7.5 cm in circumference, 6 cm in length and up to 2 cm in thickness.  There is omentum adherent to the serosa at the tumor. Percent of bowel circumference involved: 100% Tumor distance to margins:                      Proximal: 27 cm  Distal: 12 cm                      Mesenteric (sigmoid and transverse): 3.5 cm Macroscopic extent of tumor invasion: The tumor involves the full-thickness of the wall and focally extends into the adjacent soft tissue. Total presumed lymph nodes: There are 30 tan-yellow ovoid nodules grossly consistent with lymph nodes measuring 0.3 to 1 cm. Extramural satellite tumor nodules: Not grossly identified. Mucosal polyp(s): Not grossly identified. Additional findings: The uninvolved mucosa is glistening and tan.  The lumen proximal to the tumor is dilated up to 7 cm in diameter.  In the proximal ascending colon there is a 1.5 cm area covered by tan-yellow exudate.  The wall at the site is thin and focally hemorrhagic.  The appendix is present and measures 10.2 cm in length and 0.7 cm in diameter. Block summary: 17 blocks submitted 1 = proximal margin 2 = distal margin 3 = tumor with proximal uninvolved mucosa transition 4 = tumor with distal uninvolved mucosa transition 5 = tumor with inked serosa and adherent omentum 6, 7 = tumor 8 = proximal mucosa with exudate 9 = appendix 10-13 = 26 whole lymph nodes 14-17 = 1 sectioned lymph node each (GRP 07/07/2023)    Final Diagnosis performed by Orene Desanctis DO.   Electronically signed 07/08/2023 Technical and / or Professional components performed at [facility], [address] [place], [place] [address].  Immunohistochemistry Technical component (if applicable) was performed at [facility]. [address], [place], [place] [address].   IMMUNOHISTOCHEMISTRY DISCLAIMER (if applicable): Some of these immunohistochemical stains may have been developed and the performance characteristics determine by [facility]. Some may not have been cleared or approved by the U.S. Food and Drug Administration. The FDA has determined that such clearance or approval is not necessary. This test is used for clinical purposes. It should not be regarded as investigational or for research. This laboratory is certified under the Clinical Laboratory Improvement Amendments of 1988 (CLIA-88) as qualified to perform high complexity clinical laboratory testing.  The controls stained appropriately.   IHC stains are performed on formalin fixed, paraffin embedded tissue using a 3,3"diaminobenzidine (DAB) chromogen and Leica Bond Autostainer System. The staining intensity of the nucleus is score manually and is reported as the percentage of tumor cell nuclei demonstrating specific nuclear staining. The specimens are fixed in 10% Neutral Formalin for at least 6 hours and up to 72hrs. These tests are validated on decalcified tissue. Results should be interpreted with caution given the possibility of false negative results on decalcified specimens. Antibody Clones are as follows ER-clone 51F, PR-clone 16, Ki67- clone MM1. Some of these immunohistochemical stains may have been developed and the performance characteristics determined by [facility].  ADDENDUM: Mismatch Repair Protein (IHC)  SUMMARY INTERPRETATION: ABNORMAL  There is loss of the major and minor MMR proteins MLH1 and PMS2. The loss of expression may be secondary to promoter hyper-methylation, gene mutation or other genetic event. BRAF  mutation testing and/or MLH1 methylation testing is indicated. The presence of a BRAF mutation and/or MLH1 hypermethylation is indicative of a sporadic-type tumor. The absence of either BRAF mutation and/or presence of normal methylation indicate the possible presence of a hereditary germline mutation (e.g. Lynch syndrome) and referral to genetic counseling is warranted. It is recommended that the loss of protein expression be correlated with molecular based MSI testing.  IHC EXPRESSION RESULTS TEST  RESULT MLH1:          LOSS OF NUCLEAR EXPRESSION MSH2:          Preserved nuclear expression MSH6:          Preserved nuclear expression PMS2:          LOSS OF NUCLEAR EXPRESSION  References: 1. Guidelines on Genetic Evaluation and Management of Lynch Syndrome: A Consensus Statement by the [place] Multi-Society Task Force on Colorectal Cancer Stana Bunting. Ileana Roup , MD, and other . Am Katheren Puller 2014; [phone]; doi: 10.1038/ajg.2014.186; published online 27 December 2012 2. Outcomes of screening endometrial cancer patients for Lynch syndrome by patient-administered checklist. Garner Nash MS, and others. Gynecol Oncol 2013;131(3):619-623. 3. Muir-Torre syndrome (MTS): An update and approach to diagnosis and management. Kym Groom, MD and others. J Am Acad Dermatol [phone].  Addendum #1 performed by Jimmy Picket, MD.   Electronically signed 07/09/2023 Technical component performed at [facility], [address] [place], [place] [address].  Professional component performed at [employer]. [facility], [address], [place], [place] [address].  Immunohistochemistry Technical component (if applicable) was performed at [facility]. [address], [place], [place] [address].   IMMUNOHISTOCHEMISTRY DISCLAIMER (if applicable): Some of these immunohistochemical stains may have been developed and the performance  characteristics determine by [facility]. Some may not have been cleared or approved by the U.S. Food and Drug Administration. The FDA has determined that such clearance or approval is not necessary. This test is used for clinical purposes. It should not be regarded as investigational or for research. This laboratory is certified under the Clinical Laboratory Improvement Amendments of 1988 (CLIA-88) as qualified to perform high complexity clinical laboratory testing.  The controls stained appropriately.   IHC stains are performed on formalin fixed, paraffin embedded tissue using a 3,3"diaminobenzidine (DAB) chromogen and Leica Bond Autostainer System. The staining intensity of the nucleus is score manually and is reported as the percentage of tumor cell nuclei demonstrating specific nuclear staining. The specimens are fixed in 10% Neutral Formalin for at least 6 hours and up to 72hrs. These tests are validated on decalcified tissue. Results should be interpreted with caution given the possibility of false negative results on decalcified specimens. Antibody Clones are as follows ER-clone 33F, PR-clone 16, Ki67- clone MM1. Some of these immunohistochemical stains may have been developed and the performance characteristics determined by [facility].  Resulting Agency [facility]            Cultures: No results found for this or any previous visit (from the past 720 hours).  Labs: Results for orders placed or performed during the hospital encounter of 07/01/23 (from the past 48 hours)  CBC     Status: Abnormal   Collection Time: 07/10/23  5:29 AM  Result Value Ref Range   WBC 15.7 (H) 4.0 - 10.5 K/uL   RBC 3.30 (L) 3.87 - 5.11 MIL/uL   Hemoglobin 8.0 (L) 12.0 - 15.0 g/dL   HCT 29.5 (L) 28.4 - 13.2 %   MCV 81.2 80.0 - 100.0 fL   MCH 24.2 (L) 26.0 - 34.0 pg   MCHC 29.9 (L) 30.0 - 36.0 g/dL   RDW 44.0 (H) 10.2 - 72.5 %   Platelets 552 (H) 150 - 400 K/uL    nRBC 0.0 0.0 - 0.2 %    Comment: Performed at [facility], [address] [place], [place] [address]  Basic metabolic panel  Status: Abnormal   Collection Time: 07/10/23  5:29 AM  Result Value Ref Range   Sodium 136 135 - 145 mmol/L   Potassium 4.4 3.5 - 5.1 mmol/L   Chloride 104 98 - 111 mmol/L   CO2 24 22 - 32 mmol/L   Glucose, Bld 130 (H) 70 - 99 mg/dL    Comment: Glucose reference range applies only to samples taken after fasting for at least 8 hours.   BUN 25 (H) 8 - 23 mg/dL   Creatinine, Ser 0.98 0.44 - 1.00 mg/dL   Calcium 8.9 8.9 - 11.9 mg/dL   GFR, Estimated >14 >78 mL/min    Comment: (NOTE) Calculated using the CKD-EPI Creatinine Equation (2021)    Anion gap 8 5 - 15    Comment: Performed at [facility], [address] [place], [place] [address]  CBC     Status: Abnormal   Collection Time: 07/10/23 12:31 PM  Result Value Ref Range   WBC 10.5 4.0 - 10.5 K/uL   RBC 3.19 (L) 3.87 - 5.11 MIL/uL   Hemoglobin 7.7 (L) 12.0 - 15.0 g/dL   HCT 13.0 (L) 86.5 - 78.4 %   MCV 80.3 80.0 - 100.0 fL   MCH 24.1 (L) 26.0 - 34.0 pg   MCHC 30.1 30.0 - 36.0 g/dL   RDW 69.6 (H) 29.5 - 28.4 %   Platelets 558 (H) 150 - 400 K/uL   nRBC 0.0 0.0 - 0.2 %    Comment: Performed at [facility], [address] [place], [place] [address]  CBC     Status: Abnormal   Collection Time: 07/11/23  5:04 AM  Result Value Ref Range   WBC 7.9 4.0 - 10.5 K/uL   RBC 3.09 (L) 3.87 - 5.11 MIL/uL   Hemoglobin 7.6 (L) 12.0 - 15.0 g/dL   HCT 01.0 (L) 27.2 - 53.6 %   MCV 79.9 (L) 80.0 - 100.0 fL   MCH 24.6 (L) 26.0 - 34.0 pg   MCHC 30.8 30.0 - 36.0 g/dL   RDW 64.4 (H) 03.4 - 74.2 %   Platelets 523 (H) 150 - 400 K/uL   nRBC 0.0 0.0 - 0.2 %    Comment: Performed at [facility], [address], [place], [place] [address]  Basic metabolic panel     Status: Abnormal   Collection Time: 07/11/23  5:04 AM  Result Value Ref Range    Sodium 136 135 - 145 mmol/L   Potassium 3.9 3.5 - 5.1 mmol/L   Chloride 104 98 - 111 mmol/L   CO2 24 22 - 32 mmol/L   Glucose, Bld 108 (H) 70 - 99 mg/dL    Comment: Glucose reference range applies only to samples taken after fasting for at least 8 hours.   BUN 28 (H) 8 - 23 mg/dL   Creatinine, Ser 8.75 0.44 - 1.00 mg/dL   Calcium 8.6 (L) 8.9 - 10.3 mg/dL   GFR, Estimated >64 >33 mL/min    Comment: (NOTE) Calculated using the CKD-EPI Creatinine Equation (2021)    Anion gap 8 5 - 15    Comment: Performed at [facility], [address] [place], [place] [address]    Imaging / Studies: No results found.  Medications / Allergies: per chart  Antibiotics: Anti-infectives (From admission, onward)    Start     Dose/Rate Route Frequency Ordered Stop   07/06/23 0945  ceFAZolin (ANCEF) IVPB 2g/100 mL premix  2 g 200 mL/hr over 30 Minutes Intravenous On call to O.R. 07/06/23 0853 07/06/23 1221         Note: Portions of this report may have been transcribed using voice recognition software. Every effort was made to ensure accuracy; however, inadvertent computerized transcription errors may be present.   Any transcriptional errors that result from this process are unintentional.    Ardeth Sportsman, MD, FACS, MASCRS Esophageal, Gastrointestinal & Colorectal Surgery Robotic and Minimally Invasive Surgery  [facility] [address], [address] [place], [place] [address] [phone] Fax [phone] Main  CONTACT INFORMATION: Weekday (9AM-5PM): Call [facility] main office at [phone] Weeknight (5PM-9AM) or Weekend/Holiday: Check EPIC "Web Links" tab & use "AMION" (password " [email or web address]") for General Surgery [facility] coverage  Please, DO NOT use SecureChat  (it is not reliable communication to reach operating surgeons & will lead to a delay in care).   Epic staff messaging available for outptient concerns  needing 1-2 business day response.      07/11/2023  7:57 AM

## 2023-07-11 NOTE — Progress Notes (Signed)
PROGRESS NOTE    Tina Mccullough  ZOX:[id number] DOB: 1951-03-13 DOA: 07/01/2023 PCP: [name], MD   Brief Narrative:  73 year old female history of lymphocytic colitis, COPD DVT PE hypertension recent admission on 11/28 for fall and DVT presented to the [facility] with 4 days of purple stool then some blood, abdominal pain on right side and had outpatient CT scan done that showed a mass and potentially some colitis and sent to the ED In the ED hemodynamically stable-initially tachycardic 117, labs with hypokalemia severe anemia hemoglobin 7.1> 6.3 g and admission was requested to unit PRBC was ordered on admission. CT scan abdomen with contrast 1/23: Obstructing masslike lesion in hepatic flexure suspicious for colon carcinoma dilated ascending colon with mild wall thickening suspicious for colitis mild right colonic wall thickening.   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 S.p 2 units PRBC-Hb stable so far. CT chest no evidence of metastasis. GI surgery following  -07/04/23 colonoscopy-showed large obstructing and infiltrative mass in the proximal transverse colon/hepatic flexure but unable to advance the scope-Biopsy showed adenocarcinoma. -07/06/23 underwent right hemicolectomy surgical pathology:>Invasive moderately differentiated adenocarcinoma extending through submucosa, muscularis propria and into subserosal adipose tissue, see note.Margins negative.Unremarkable appendix. -Postop significant leukocytosis that have resolved.  Remained afebrile. -Continue pain control PT OT ambulation -Had bloody BM this morning.  H&H dropped from 8.0-7.6.  Will continue to monitor H&H closely.  Hold on anticoagulation.  Odynophagia: -Tells me that the food stuck in her throat. 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   - patient had bloody stool on 2/1 and 2/2.  Will hold on resumption of Xarelto at this time.  COPD: -Not in exacerbation, cont IS.   Tobacco abuse: Continue nicotine patch  Hypokalemia Hypomagnesemia: -Replenished.  Thrombocytosis: -Likely in the setting of underlying adenocarcinoma of colon  DVT prophylaxis: SCD Code Status: Full code Family Communication:  None present at bedside.  Plan of care discussed with patient in length and he verbalized understanding and agreed with it. Disposition Plan: To be determined  Consultants:  GI General surgery Palliative  Procedures:  Colonoscopy Hemicolectomy  Antimicrobials:  None  Status is: Inpatient   Subjective: Patient seen and examined.  Complaining of not feeling well overall.  She had bloody bowel movement last night and this morning.  Complaining of difficulty swallowing and feels like food stuck in her throat.  Objective: Vitals:   07/10/23 0530 07/10/23 1313 07/10/23 2152 07/11/23 0609  BP: (!) 142/88 (!) 145/82 130/84 (!) 156/69  Pulse: (!) 108 97 (!) 110 95  Resp: 17 20 19 16   Temp: 99 F (37.2 C) 98.6 F (37 C) 99 F (37.2 C) 98.3 F (36.8 C)  TempSrc: Oral Oral Oral Oral  SpO2: 93% 93% 93% 92%  Weight:      Height:        Intake/Output Summary (Last 24 hours) at 07/11/2023 1322 Last data filed at 07/11/2023 0925 Gross per 24 hour  Intake 360 ml  Output --  Net 360 ml   Filed Weights   07/02/23 1100 07/06/23 1022  Weight: 65.4 kg 61.2 kg    Examination:  General exam: Appears calm and comfortable,  on room air, communicating well Respiratory system: Clear to auscultation. Respiratory effort normal. Cardiovascular system: S1 & S2 heard, RRR. No JVD, murmurs, rubs, gallops or clicks. No pedal edema. Gastrointestinal system:  Abdomen is nondistended, soft and nontender.  Midline surgical dressing dry and intact.   Central nervous system: Alert and oriented. No focal neurological deficits. Extremities: Symmetric 5 x 5 power. Skin: No rashes, lesions or ulcers Psychiatry: Judgement and insight appear normal. Mood & affect appropriate.    Data Reviewed: I have personally reviewed following labs and imaging studies  CBC: Recent Labs  Lab 07/08/23 0731 07/09/23 0434 07/10/23 0529 07/10/23 1231 07/11/23 0504  WBC 24.2* 13.9* 15.7* 10.5 7.9  HGB 8.0* 7.6* 8.0* 7.7* 7.6*  HCT 26.5* 24.5* 26.8* 25.6* 24.7*  MCV 80.8 79.8* 81.2 80.3 79.9*  PLT 456* 483* 552* 558* 523*   Basic Metabolic Panel: Recent Labs  Lab 07/07/23 0533 07/08/23 0731 07/09/23 0434 07/10/23 0529 07/11/23 0504  NA 137 133* 133* 136 136  K 3.4* 3.3* 3.8 4.4 3.9  CL 102 98 104 104 104  CO2 26 25 24 24 24   GLUCOSE 105* 94 106* 130* 108*  BUN 11 16 21  25* 28*  CREATININE 0.83 0.84 0.69 0.89 0.67  CALCIUM 8.6* 8.4* 8.3* 8.9 8.6*  MG 1.6* 2.1 1.8  --   --    GFR: Estimated Creatinine Clearance: 58.9 mL/min (by C-G formula based on SCr of 0.67 mg/dL). Liver Function Tests: No results for input(s): "AST", "ALT", "ALKPHOS", "BILITOT", "PROT", "ALBUMIN" in the last 168 hours. No results for input(s): "LIPASE", "AMYLASE" in the last 168 hours. No results for input(s): "AMMONIA" in the last 168 hours. Coagulation Profile: No results for input(s): "INR", "PROTIME" in the last 168 hours. Cardiac Enzymes: No results for input(s): "CKTOTAL", "CKMB", "CKMBINDEX", "TROPONINI" in the last 168 hours. BNP (last 3 results) No results for input(s): "PROBNP" in the last 8760 hours. HbA1C: No results for input(s): "HGBA1C" in the last 72 hours. CBG: No results for input(s): "GLUCAP" in the last 168 hours. Lipid Profile: No results for input(s): "CHOL", "HDL", "LDLCALC", "TRIG", "CHOLHDL", "LDLDIRECT" in the last 72 hours. Thyroid Function  Tests: No results for input(s): "TSH", "T4TOTAL", "FREET4", "T3FREE", "THYROIDAB" in the last 72 hours. Anemia Panel: No results for input(s): "VITAMINB12", "FOLATE", "FERRITIN", "TIBC", "IRON", "RETICCTPCT" in the last 72 hours. Sepsis Labs: No results for input(s): "PROCALCITON", "LATICACIDVEN" in the last 168 hours.  No results found for this or any previous visit (from the past 240 hours).    Radiology Studies: No results found.  Scheduled Meds:  acetaminophen  1,000 mg Oral Q6H   vitamin C  500 mg Oral Daily   feeding supplement  237 mL Oral BID BM   ferrous sulfate  325 mg Oral BID WC   magic mouthwash  15 mL Oral TID   multivitamin with minerals  1 tablet Oral Daily   pantoprazole  40 mg Oral Daily   polycarbophil  625 mg Oral BID   sodium chloride flush  3 mL Intravenous Q12H   Continuous Infusions:  sodium chloride     sodium chloride 40 mL/hr at 07/10/23 2211   lactated ringers       LOS: 10 days   Time spent: 35 minutes   Tina Mccullough Estill Cotta, MD [facility]  If 7PM-7AM, please contact night-coverage [email or web address] 07/11/2023, 1:22 PM

## 2023-07-11 NOTE — Progress Notes (Signed)
OT Cancellation Note  Patient Details Name: Resha Filippone MRN: [id number] DOB: Jul 09, 1950   Cancelled Treatment:    Reason Eval/Treat Not Completed: OT screened, no needs identified, will sign off Per PT, patient is independent in Adls in room at this time. OT to sign off. Thank you for this referral. Rosalio Loud, MS Acute Rehabilitation Department Office# [phone]  07/11/2023, 3:54 PM

## 2023-07-11 NOTE — Evaluation (Addendum)
Physical Therapy Evaluation Patient Details Name: Tina Mccullough MRN: [id number] DOB: [birth year] Today's Date: 07/11/2023  History of Present Illness  73 yo female with  recent admission on 11/28 for fall and DVT. presented to the [facility] ED with 4 days of purple, abdominal pain on right side. outpatient CT scan done showed a mass and potentially some colitis   s/p open R hemicolectomy on 1/28 per Dr. Fredricka Bonine. 1/27- duplex showed age indeterminate dvt lefft diatsl axillay vein  PMH:  lymphocytic colitis, COPD, DVT, PE hypertension  Clinical Impression  Patient evaluated by Physical Therapy with no further acute PT needs identified. All education has been completed and the patient has no further questions.  Pt amb ~400' I'ly. No device, no LOB. Pt is active and independent at her baseline. Encouraged pt to continue mobilizing during hospital stay  See below for any follow-up Physical Therapy or equipment needs. PT is signing off. Thank you for this referral.         If plan is discharge home, recommend the following:     Can travel by private vehicle        Equipment Recommendations None recommended by PT  Recommendations for Other Services       Functional Status Assessment Patient has not had a recent decline in their functional status     Precautions / Restrictions Precautions Precautions: None Restrictions Weight Bearing Restrictions Per Provider Order: No      Mobility  Bed Mobility Overal bed mobility: Independent                  Transfers Overall transfer level: Independent                      Ambulation/Gait Ambulation/Gait assistance: Independent Gait Distance (Feet): 400 Feet Assistive device: None Gait Pattern/deviations: WFL(Within Functional Limits)          Stairs            Wheelchair Mobility     Tilt Bed    Modified Rankin (Stroke Patients Only)       Balance Overall balance assessment: Independent                                            Pertinent Vitals/Pain Pain Assessment Pain Assessment: Faces Faces Pain Scale: Hurts a little bit Pain Location: odynophagia Pain Intervention(s): Monitored during session    Home Living Family/patient expects to be discharged to:: Private residence Living Arrangements: Alone Available Help at Discharge: Family (son) Type of Home: House           Home Equipment: None      Prior Function Prior Level of Function : Independent/Modified Independent;Driving             Mobility Comments: denies falls; 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; retired from training drug/bom dogs--German Shepherds and [place] [employer] ADLs Comments: ind     Extremity/Trunk Assessment   Upper Extremity Assessment Upper Extremity Assessment: Overall WFL for tasks assessed    Lower Extremity Assessment Lower Extremity Assessment: Overall WFL for tasks assessed       Communication   Communication Communication: No apparent difficulties  Cognition Arousal: Alert Behavior During Therapy: WFL for tasks assessed/performed Overall Cognitive Status: Within Functional Limits for tasks assessed  General Comments      Exercises     Assessment/Plan    PT Assessment Patient does not need any further PT services  PT Problem List         PT Treatment Interventions      PT Goals (Current goals can be found in the Care Plan section)  Acute Rehab PT Goals PT Goal Formulation: All assessment and education complete, DC therapy    Frequency       Co-evaluation               AM-PAC PT "6 Clicks" Mobility  Outcome Measure Help needed turning from your back to your side while in a flat bed without using bedrails?: None Help needed moving from lying on your back to sitting on the side of a flat bed without using bedrails?: None Help needed moving to and from a bed to a  chair (including a wheelchair)?: None Help needed standing up from a chair using your arms (e.g., wheelchair or bedside chair)?: None Help needed to walk in hospital room?: None Help needed climbing 3-5 steps with a railing? : None 6 Click Score: 24    End of Session Equipment Utilized During Treatment: Gait belt Activity Tolerance: Patient tolerated treatment well Patient left: in chair;with call bell/phone within reach   PT Visit Diagnosis: Other abnormalities of gait and mobility (R26.89)    Time: 5284-1324 PT Time Calculation (min) (ACUTE ONLY): 15 min   Charges:   PT Evaluation $PT Eval Low Complexity: 1 Low   PT General Charges $$ ACUTE PT VISIT: 1 Visit         Sweta Halseth, PT  Acute Rehab Dept [facility]) [phone]  07/11/2023   [facility] 07/11/2023, 10:42 AM

## 2023-07-11 NOTE — Progress Notes (Signed)
Patient has a semi formed stool that was bloody, concerned that this is not normal. Stated, "this the third one like this". Dark red in color, small size.   I told the patient that with the particular surgery that she had, this is not an abnormal occurrence especially during the first recover period. Plan of care ongoing.

## 2023-07-12 DIAGNOSIS — D62 Acute posthemorrhagic anemia: Secondary | ICD-10-CM | POA: Diagnosis not present

## 2023-07-12 DIAGNOSIS — Z515 Encounter for palliative care: Secondary | ICD-10-CM | POA: Diagnosis not present

## 2023-07-12 DIAGNOSIS — R4589 Other symptoms and signs involving emotional state: Secondary | ICD-10-CM

## 2023-07-12 DIAGNOSIS — K6389 Other specified diseases of intestine: Secondary | ICD-10-CM | POA: Diagnosis not present

## 2023-07-12 DIAGNOSIS — C189 Malignant neoplasm of colon, unspecified: Secondary | ICD-10-CM | POA: Diagnosis not present

## 2023-07-12 DIAGNOSIS — Z720 Tobacco use: Secondary | ICD-10-CM

## 2023-07-12 LAB — CBC
HCT: 24.8 % — ABNORMAL LOW (ref 36.0–46.0)
Hemoglobin: 7.5 g/dL — ABNORMAL LOW (ref 12.0–15.0)
MCH: 24.3 pg — ABNORMAL LOW (ref 26.0–34.0)
MCHC: 30.2 g/dL (ref 30.0–36.0)
MCV: 80.3 fL (ref 80.0–100.0)
Platelets: 572 10*3/uL — ABNORMAL HIGH (ref 150–400)
RBC: 3.09 MIL/uL — ABNORMAL LOW (ref 3.87–5.11)
RDW: 18 % — ABNORMAL HIGH (ref 11.5–15.5)
WBC: 13.1 10*3/uL — ABNORMAL HIGH (ref 4.0–10.5)
nRBC: 0.4 % — ABNORMAL HIGH (ref 0.0–0.2)

## 2023-07-12 NOTE — Progress Notes (Signed)
Progress Note  6 Days Post-Op  Subjective: Having ongoing abdominal pain and mild nausea with FLD. Not worsening. She is still having bright red blood and large clot passage with bowel movements. Last bm this am - had several yesterday. Mobilizing. She denies respiratory symptoms. She denies urinary symptoms or new pain or swelling in extremities. She has a pessary for vaginal/uterine prolapse that is changed q3 mos. She has notes some clear vaginal discharge that is new for her   Objective: Vital signs in last 24 hours: Temp:  [98.8 F (37.1 C)-99.4 F (37.4 C)] 99.4 F (37.4 C) (02/03 0645) Pulse Rate:  [87-97] 87 (02/03 0645) Resp:  [15-18] 18 (02/03 0645) BP: (148-155)/(66-70) 148/70 (02/03 0645) SpO2:  [89 %-93 %] 92 % (02/03 0655) Last BM Date : 07/11/23  Intake/Output from previous day: 02/02 0701 - 02/03 0700 In: 1101 [P.O.:360; I.V.:741] Out: 200 [Urine:200] Intake/Output this shift: No intake/output data recorded.  PE: General: pleasant, WD, female who is laying in bed in NAD Heart: regular, rate, and rhythm.  Lungs: Respiratory effort nonlabored. Pulls over 1000 on IS Abd: soft, mild distension. Mild TTP greatest over incision which is cdi with staples MSK: all 4 extremities are symmetrical with no cyanosis, clubbing, or edema. Skin: warm and dry Psych: A&Ox3 with an appropriate affect.    Lab Results:  Recent Labs    07/11/23 0504 07/12/23 0435  WBC 7.9 13.1*  HGB 7.6* 7.5*  HCT 24.7* 24.8*  PLT 523* 572*   BMET Recent Labs    07/10/23 0529 07/11/23 0504  NA 136 136  K 4.4 3.9  CL 104 104  CO2 24 24  GLUCOSE 130* 108*  BUN 25* 28*  CREATININE 0.89 0.67  CALCIUM 8.9 8.6*   PT/INR No results for input(s): "LABPROT", "INR" in the last 72 hours. CMP     Component Value Date/Time   NA 136 07/11/2023 0504   K 3.9 07/11/2023 0504   CL 104 07/11/2023 0504   CO2 24 07/11/2023 0504   GLUCOSE 108 (H) 07/11/2023 0504   BUN 28 (H) 07/11/2023  0504   CREATININE 0.67 07/11/2023 0504   CALCIUM 8.6 (L) 07/11/2023 0504   PROT 6.0 (L) 07/02/2023 1039   ALBUMIN 2.9 (L) 07/02/2023 1039   AST 15 07/02/2023 1039   ALT 13 07/02/2023 1039   ALKPHOS 73 07/02/2023 1039   BILITOT 0.6 07/02/2023 1039   GFRNONAA >60 07/11/2023 0504   GFRAA >60 06/11/2018 1653   Lipase     Component Value Date/Time   LIPASE <10 (L) 07/01/2023 1652       Studies/Results: No results found.  Anti-infectives: Anti-infectives (From admission, onward)    Start     Dose/Rate Route Frequency Ordered Stop   07/06/23 0945  ceFAZolin (ANCEF) IVPB 2g/100 mL premix        2 g 200 mL/hr over 30 Minutes Intravenous On call to O.R. 07/06/23 0853 07/06/23 1221        Assessment/Plan 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  POD6 s/p open R hemicolectomy 1/28 Dr. Fredricka Bonine   S/p c scope 1/26 Dr. Doretha Imus - Biopsy with adenocarcinoma CEA elevated to 71.3, CT CH without met disease - surgical path c/w pT3pN0 adenocarcinoma - Dr Fredricka Bonine d/w patient. She has oncology follow up  - afebrile, WBC 13.1 (7.9). Hold off on further abx at this time and trend - ongoing mild distension and nausea. No peritonitis. Continue FLD and can ADAT soft -  ongoing hematochezia - hgb 7.5 (7.6), relatively stable. Continue to hold heparin drip  - encouraged mobilization and IS   FEN: FLD ADAT soft ID: ancef periop VTE: hep gtt held with hematochezia   Per primary HTN COPD tobacco use LUE dvt - on xarelto pre op. Hep gtt held    LOS: 11 days   Eric Form, [facility] 07/12/2023, 8:56 AM Please see Amion for pager number during day hours 7:00am-4:30pm

## 2023-07-12 NOTE — Progress Notes (Signed)
Progress Note   Patient: Tina Mccullough NUU:[id number] DOB: May 23, 1951 DOA: 07/01/2023     11 DOS: the patient was seen and examined on 07/12/2023   Brief hospital course: 73 year old female history of lymphocytic colitis, COPD DVT PE hypertension recent admission on 11/28 for fall and DVT presented to the [facility] ED with 4 days of purple stool then some blood, abdominal pain on right side and had outpatient CT scan done that showed a mass and potentially some colitis and sent to the ED In the ED hemodynamically stable-initially tachycardic 117, labs with hypokalemia severe anemia hemoglobin 7.1> 6.3 g and admission was requested to unit PRBC was ordered on admission. CT scan abdomen with contrast 1/23: Obstructing masslike lesion in hepatic flexure suspicious for colon carcinoma dilated ascending colon with mild wall thickening suspicious for colitis mild right colonic wall thickening.  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 S.p 2 units PRBC-Hb stable so far. CT chest no evidence of metastasis. GI surgery following  07/04/23 colonoscopy-showed large obstructing and infiltrative mass in the proximal transverse colon/hepatic flexure but unable to advance the scope-Biopsy showed adenocarcinoma. 07/06/23 underwent right hemicolectomy surgical pathology:>Invasive moderately differentiated adenocarcinoma extending through submucosa, muscularis propria and into subserosal adipose tissue, see note.Margins negative.Unremarkable appendix. Postop significant leukocytosis that have resolved, WBC 13 today.  Remained afebrile. Continue pain control PT OT ambulation Had hematochezia but better.  H&H stable around 7.5.  Will continue to monitor H&H closely.  Continue to hold anticoagulation.   Odynophagia: Improved, continue magic mouthwash.   Hypertension: BP fairly controlled on PRNs    History of  occlusive DVT in the left subclavian and axillary vein  05/05/23 CT angio 11/27 negative for PE 05/05/23 1/27- duplex showed age indeterminate dvt left diatsl axillay vein: PTA on xarelto.   patient had bloody stool on 2/1 and 2/2.  Hold Xarelto at this time.   COPD: Not in exacerbation, continue incentive spirometry.   Tobacco abuse: Continue nicotine patch   Hypokalemia Hypomagnesemia: Replenished.   Thrombocytosis: Likely in the setting of underlying adenocarcinoma of colon.    Out of bed to chair. Incentive spirometry. Nursing supportive care. Fall, aspiration precautions. DVT prophylaxis   Code Status: Full Code  Subjective: Patient is seen and examined today morning. She feels better, has some hematochezia, walked well with PT. Eating fair.  Physical Exam: Vitals:   07/11/23 2257 07/12/23 0645 07/12/23 0655 07/12/23 1303  BP: (!) 155/66 (!) 148/70  (!) 159/70  Pulse: 97 87  93  Resp: 15 18    Temp: 98.8 F (37.1 C) 99.4 F (37.4 C)  99.1 F (37.3 C)  TempSrc: Oral Oral  Oral  SpO2: 93% (!) 89% 92% 92%  Weight:      Height:        General - Elderly Caucasian female, no apparent distress HEENT - PERRLA, EOMI, atraumatic head, non tender sinuses. Lung - Clear, basal rhonchi, no wheezes. Heart - S1, S2 heard, no murmurs, rubs, trace pedal edema. Abdomen - Soft, non tender, bowel sounds good. Neuro - Alert, awake and oriented x 3, non focal exam. Skin - Warm and dry.  Data Reviewed:      Latest Ref Rng & Units 07/12/2023    4:35 AM 07/11/2023    5:04 AM 07/10/2023   12:31 PM  CBC  WBC 4.0 - 10.5 K/uL 13.1  7.9  10.5   Hemoglobin 12.0 - 15.0  g/dL 7.5  7.6  7.7   Hematocrit 36.0 - 46.0 % 24.8  24.7  25.6   Platelets 150 - 400 K/uL 572  523  558       Latest Ref Rng & Units 07/11/2023    5:04 AM 07/10/2023    5:29 AM 07/09/2023    4:34 AM  BMP  Glucose 70 - 99 mg/dL 161  096  045   BUN 8 - 23 mg/dL 28  25  21    Creatinine 0.44 - 1.00 mg/dL 4.09  8.11   9.14   Sodium 135 - 145 mmol/L 136  136  133   Potassium 3.5 - 5.1 mmol/L 3.9  4.4  3.8   Chloride 98 - 111 mmol/L 104  104  104   CO2 22 - 32 mmol/L 24  24  24    Calcium 8.9 - 10.3 mg/dL 8.6  8.9  8.3    No results found.   Family Communication: Discussed with patient, she understand and agree. All questions answereed.  Disposition: Status is: Inpatient Remains inpatient appropriate because: bloody bowels, h/h monitor  Planned Discharge Destination: Home     Time spent: 40 minutes  Author: Marcelino Duster, MD 07/12/2023 7:30 PM Secure chat 7am to 7pm For on call review [email or web address].

## 2023-07-12 NOTE — Plan of Care (Signed)
  Problem: Pain Managment: Goal: General experience of comfort will improve and/or be controlled Outcome: Progressing   Problem: Safety: Goal: Ability to remain free from injury will improve Outcome: Progressing   Problem: Elimination: Goal: Will not experience complications related to bowel motility Outcome: Progressing Goal: Will not experience complications related to urinary retention Outcome: Progressing

## 2023-07-13 ENCOUNTER — Inpatient Hospital Stay (HOSPITAL_COMMUNITY): Payer: PPO

## 2023-07-13 DIAGNOSIS — C189 Malignant neoplasm of colon, unspecified: Secondary | ICD-10-CM | POA: Diagnosis not present

## 2023-07-13 DIAGNOSIS — K6389 Other specified diseases of intestine: Secondary | ICD-10-CM | POA: Diagnosis not present

## 2023-07-13 DIAGNOSIS — Z515 Encounter for palliative care: Secondary | ICD-10-CM | POA: Diagnosis not present

## 2023-07-13 DIAGNOSIS — D62 Acute posthemorrhagic anemia: Secondary | ICD-10-CM | POA: Diagnosis not present

## 2023-07-13 LAB — BASIC METABOLIC PANEL
Anion gap: 7 (ref 5–15)
BUN: 26 mg/dL — ABNORMAL HIGH (ref 8–23)
CO2: 23 mmol/L (ref 22–32)
Calcium: 8.6 mg/dL — ABNORMAL LOW (ref 8.9–10.3)
Chloride: 106 mmol/L (ref 98–111)
Creatinine, Ser: 0.76 mg/dL (ref 0.44–1.00)
GFR, Estimated: >60 mL/min (ref >60)
Glucose, Bld: 88 mg/dL (ref 70–99)
Potassium: 3.8 mmol/L (ref 3.5–5.1)
Sodium: 136 mmol/L (ref 135–145)

## 2023-07-13 LAB — CBC
HCT: 24.7 % — ABNORMAL LOW (ref 36.0–46.0)
Hemoglobin: 7.5 g/dL — ABNORMAL LOW (ref 12.0–15.0)
MCH: 24.7 pg — ABNORMAL LOW (ref 26.0–34.0)
MCHC: 30.4 g/dL (ref 30.0–36.0)
MCV: 81.3 fL (ref 80.0–100.0)
Platelets: 615 10*3/uL — ABNORMAL HIGH (ref 150–400)
RBC: 3.04 MIL/uL — ABNORMAL LOW (ref 3.87–5.11)
RDW: 18.2 % — ABNORMAL HIGH (ref 11.5–15.5)
WBC: 14.8 10*3/uL — ABNORMAL HIGH (ref 4.0–10.5)
nRBC: 0.3 % — ABNORMAL HIGH (ref 0.0–0.2)

## 2023-07-13 MED ORDER — HYDROMORPHONE HCL 1 MG/ML IJ SOLN
0.5000 mg | INTRAMUSCULAR | Status: DC | PRN
  Administered 2023-07-13 – 2023-07-16 (×10): 1 mg via INTRAVENOUS
  Filled 2023-07-13 (×10): qty 1

## 2023-07-13 MED ORDER — IOHEXOL 300 MG/ML  SOLN: 15.0000 mL | INTRAMUSCULAR | Status: AC

## 2023-07-13 MED ORDER — IOHEXOL 300 MG/ML  SOLN
100.0000 mL | Freq: Once | INTRAMUSCULAR | Status: AC | PRN
  Administered 2023-07-13: 100 mL via INTRAVENOUS

## 2023-07-13 MED ORDER — METHOCARBAMOL 500 MG PO TABS
750.0000 mg | ORAL_TABLET | Freq: Four times a day (QID) | ORAL | Status: DC | PRN
  Administered 2023-07-13: 750 mg via ORAL
  Filled 2023-07-13: qty 2

## 2023-07-13 MED ORDER — OXYCODONE HCL 5 MG PO TABS
5.0000 mg | ORAL_TABLET | ORAL | Status: DC | PRN
  Administered 2023-07-13 – 2023-07-17 (×9): 10 mg via ORAL
  Filled 2023-07-13 (×9): qty 2

## 2023-07-13 NOTE — Progress Notes (Signed)
 Progress Note   Patient: Tina Mccullough FMW:[id number] DOB: [birth year] DOA: 07/01/2023     12 DOS: the patient was seen and examined on 07/13/2023   Brief hospital course: 73 year old female history of lymphocytic colitis, COPD DVT PE hypertension recent admission on 11/28 for fall and DVT presented to the [facility] with 4 days of purple stool then some blood, abdominal pain on right side and had outpatient CT scan done that showed a mass and potentially some colitis and sent to the ED In the ED hemodynamically stable-initially tachycardic 117, labs with hypokalemia severe anemia hemoglobin 7.1> 6.3 g and admission was requested to unit PRBC was ordered on admission. CT scan abdomen with contrast 1/23: Obstructing masslike lesion in hepatic flexure suspicious for colon carcinoma dilated ascending colon with mild wall thickening suspicious for colitis mild right colonic wall thickening.  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 S.p 2 units PRBC-Hb stable so far. CT chest no evidence of metastasis. GI surgery following  07/04/23 colonoscopy-showed large obstructing and infiltrative mass in the proximal transverse colon/hepatic flexure but unable to advance the scope-Biopsy showed adenocarcinoma. 07/06/23 underwent right hemicolectomy surgical pathology:>Invasive moderately differentiated adenocarcinoma extending through submucosa, muscularis propria and into subserosal adipose tissue, see note.Margins negative. Unremarkable appendix. Postop significant leukocytosis WBC 14.8 today.  Remained afebrile. Surgery team advised CT abdomen and pelvis to rule out abscess/ complications. Had hematochezia but better today.  H&H stable around 7.5.  Will continue to monitor H&H closely.  Resume anticoagulation once CT reassuring,   Odynophagia: Improved, continue magic mouthwash. She is able to tolerate  diet.   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  Heparin  drip held due to hematochezia. patient had bloody stool on 2/1 and 2/2.  Hold Xarelto  at this time.   COPD: Not in exacerbation, continue incentive spirometry.   Tobacco abuse: Continue nicotine  patch   Hypokalemia Hypomagnesemia: Replenished.   Thrombocytosis: Likely in the setting of underlying adenocarcinoma of colon.    Out of bed to chair. Incentive spirometry. Nursing supportive care. Fall, aspiration precautions. DVT prophylaxis   Code Status: Full Code  Subjective: Patient is seen and examined today morning. She feels better, has dark stools. Out of bed with PT. Eating fair. Awaiting CT abdomen/ pelvis.  Physical Exam: Vitals:   07/12/23 1303 07/12/23 2023 07/13/23 0545 07/13/23 1316  BP: (!) 159/70 (!) 151/79 (!) 159/78 (!) 158/85  Pulse: 93 95 85 86  Resp:  18 16 16   Temp: 99.1 F (37.3 C) 98.7 F (37.1 C) 98.8 F (37.1 C) 98.6 F (37 C)  TempSrc: Oral Oral Oral Oral  SpO2: 92% 93% 95% 95%  Weight:      Height:        General - Elderly Caucasian female, no apparent distress HEENT - PERRLA, EOMI, atraumatic head, non tender sinuses. Lung - Clear, basal rhonchi, no wheezes. Heart - S1, S2 heard, no murmurs, rubs, trace pedal edema. Abdomen - Soft, non tender, bowel sounds good. Neuro - Alert, awake and oriented x 3, non focal exam. Skin - Warm and dry.  Data Reviewed:      Latest Ref Rng & Units 07/13/2023    5:03 AM 07/12/2023    4:35  AM 07/11/2023    5:04 AM  CBC  WBC 4.0 - 10.5 K/uL 14.8  13.1  7.9   Hemoglobin 12.0 - 15.0 g/dL 7.5  7.5  7.6   Hematocrit 36.0 - 46.0 % 24.7  24.8  24.7   Platelets 150 - 400 K/uL 615  572  523       Latest Ref Rng & Units 07/13/2023    5:03 AM  07/11/2023    5:04 AM 07/10/2023    5:29 AM  BMP  Glucose 70 - 99 mg/dL 88  891  869   BUN 8 - 23 mg/dL 26  28  25    Creatinine 0.44 - 1.00 mg/dL 9.23  9.32  9.10   Sodium 135 - 145 mmol/L 136  136  136   Potassium 3.5 - 5.1 mmol/L 3.8  3.9  4.4   Chloride 98 - 111 mmol/L 106  104  104   CO2 22 - 32 mmol/L 23  24  24    Calcium  8.9 - 10.3 mg/dL 8.6  8.6  8.9    Family Communication: Discussed with patient, she understand and agree. All questions answereed.  Disposition: Status is: Inpatient Remains inpatient appropriate because: CT abd, h/h monitor  Planned Discharge Destination: Home     Time spent: 39 minutes  Author: Concepcion Riser, MD 07/13/2023 8:37 PM Secure chat 7am to 7pm For on call review [email or web address].

## 2023-07-13 NOTE — Progress Notes (Signed)
Patient was not able to drink the contrast for the CT. Leary Roca PA and Barnetta Chapel PA were aware and said to proceed with the CT. I called CT to let them know. Waiting for transport to take patient to CT.

## 2023-07-13 NOTE — Progress Notes (Signed)
 Progress Note  7 Days Post-Op  Subjective: CC Patient reports ongoing abdominal bloating, burping/belching and nausea exacerbated by p.o. intake .  No vomiting.  Passing flatus.  BM overnight that was less bloody but more dark/tarry. She is on iron  now. Voiding without issues.  No shortness of breath.  Mobilizing in the halls.  Afebrile. No tachycardia or hypotension. WBC 14.8 from 13.1. Hgb stable at 7.5  Objective: Vital signs in last 24 hours: Temp:  [98.7 F (37.1 C)-99.1 F (37.3 C)] 98.8 F (37.1 C) (02/04 0545) Pulse Rate:  [85-95] 85 (02/04 0545) Resp:  [16-18] 16 (02/04 0545) BP: (151-159)/(70-79) 159/78 (02/04 0545) SpO2:  [92 %-95 %] 95 % (02/04 0545) Last BM Date : 07/13/23  Intake/Output from previous day: 02/03 0701 - 02/04 0700 In: 1080 [P.O.:1080] Out: -  Intake/Output this shift: No intake/output data recorded.  PE: General: pleasant, WD, female who is laying in bed in NAD Heart: RRR Lungs: CTA b/l. Normal rate and effort Abd: Soft, mild distension, +BS. Appropriately ttp around her incision - otherwise NT. No rigidity or guarding. Some erythema around midline wound as noted below.  MSK: No LE edema   Lab Results:  Recent Labs    07/12/23 0435 07/13/23 0503  WBC 13.1* 14.8*  HGB 7.5* 7.5*  HCT 24.8* 24.7*  PLT 572* 615*   BMET Recent Labs    07/11/23 0504 07/13/23 0503  NA 136 136  K 3.9 3.8  CL 104 106  CO2 24 23  GLUCOSE 108* 88  BUN 28* 26*  CREATININE 0.67 0.76  CALCIUM  8.6* 8.6*   PT/INR No results for input(s): LABPROT, INR in the last 72 hours. CMP     Component Value Date/Time   NA 136 07/13/2023 0503   K 3.8 07/13/2023 0503   CL 106 07/13/2023 0503   CO2 23 07/13/2023 0503   GLUCOSE 88 07/13/2023 0503   BUN 26 (H) 07/13/2023 0503   CREATININE 0.76 07/13/2023 0503   CALCIUM  8.6 (L) 07/13/2023 0503   PROT 6.0 (L) 07/02/2023 1039   ALBUMIN  2.9 (L) 07/02/2023 1039   AST 15 07/02/2023 1039   ALT 13 07/02/2023  1039   ALKPHOS 73 07/02/2023 1039   BILITOT 0.6 07/02/2023 1039   GFRNONAA >60 07/13/2023 0503   GFRAA >60 06/11/2018 1653   Lipase     Component Value Date/Time   LIPASE <10 (L) 07/01/2023 1652       Studies/Results: No results found.  Anti-infectives: Anti-infectives (From admission, onward)    Start     Dose/Rate Route Frequency Ordered Stop   07/06/23 0945  ceFAZolin  (ANCEF ) IVPB 2g/100 mL premix        2 g 200 mL/hr over 30 Minutes Intravenous On call to O.R. 07/06/23 0853 07/06/23 1221        Assessment/Plan POD7 s/p open R hemicolectomy 1/28 Dr. 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    - S/p c scope 1/26 Dr. Brambhatt - Biopsy with adenocarcinoma - CEA elevated to 71.3, CT CH without met disease - Surgical path c/w [id number] adenocarcinoma - Dr Signe d/w patient. She has oncology follow up  - CT today for ongoing leukocytosis.  - Will review midline wound with attending to determine if monitor vs remove staples.  - Hematochezia resolved. Reports dark/tarry stool but is on iron . Hgb able at 7.5. Continue to hold heparin  drip. If CT reassuring and hgb stable tomorrow can likely resume with no  bolus.  - Encouraged mobilization and IS   FEN: FLD, IVF per primary  ID: Ancef  periop VTE: Hep gtt on hold as above   Per primary HTN COPD tobacco use LUE dvt - on xarelto  pre op. Hep gtt held    LOS: 12 days   Ozell CHRISTELLA Shaper, [facility] Surgery 07/13/2023, 9:25 AM Please see Amion for pager number during day hours 7:00am-4:30pm

## 2023-07-13 NOTE — Plan of Care (Signed)
  Problem: Activity: Goal: Risk for activity intolerance will decrease Outcome: Completed/Met   Problem: Elimination: Goal: Will not experience complications related to bowel motility Outcome: Completed/Met   Problem: Elimination: Goal: Will not experience complications related to urinary retention Outcome: Completed/Met

## 2023-07-14 ENCOUNTER — Inpatient Hospital Stay (HOSPITAL_COMMUNITY): Payer: PPO

## 2023-07-14 ENCOUNTER — Telehealth: Payer: Self-pay | Admitting: Hematology and Oncology

## 2023-07-14 DIAGNOSIS — M7989 Other specified soft tissue disorders: Secondary | ICD-10-CM

## 2023-07-14 DIAGNOSIS — I82623 Acute embolism and thrombosis of deep veins of upper extremity, bilateral: Secondary | ICD-10-CM | POA: Diagnosis not present

## 2023-07-14 DIAGNOSIS — C189 Malignant neoplasm of colon, unspecified: Secondary | ICD-10-CM

## 2023-07-14 DIAGNOSIS — F17210 Nicotine dependence, cigarettes, uncomplicated: Secondary | ICD-10-CM

## 2023-07-14 DIAGNOSIS — Z7189 Other specified counseling: Secondary | ICD-10-CM

## 2023-07-14 DIAGNOSIS — J449 Chronic obstructive pulmonary disease, unspecified: Secondary | ICD-10-CM

## 2023-07-14 DIAGNOSIS — K6389 Other specified diseases of intestine: Secondary | ICD-10-CM | POA: Diagnosis not present

## 2023-07-14 DIAGNOSIS — E876 Hypokalemia: Secondary | ICD-10-CM

## 2023-07-14 DIAGNOSIS — K922 Gastrointestinal hemorrhage, unspecified: Secondary | ICD-10-CM | POA: Insufficient documentation

## 2023-07-14 DIAGNOSIS — I10 Essential (primary) hypertension: Secondary | ICD-10-CM

## 2023-07-14 LAB — HEPARIN LEVEL (UNFRACTIONATED): Heparin Unfractionated: <0.1 [IU]/mL — ABNORMAL LOW (ref 0.30–0.70)

## 2023-07-14 LAB — CBC
HCT: 26.8 % — ABNORMAL LOW (ref 36.0–46.0)
Hemoglobin: 8.2 g/dL — ABNORMAL LOW (ref 12.0–15.0)
MCH: 24.8 pg — ABNORMAL LOW (ref 26.0–34.0)
MCHC: 30.6 g/dL (ref 30.0–36.0)
MCV: 81.2 fL (ref 80.0–100.0)
Platelets: 662 10*3/uL — ABNORMAL HIGH (ref 150–400)
RBC: 3.3 MIL/uL — ABNORMAL LOW (ref 3.87–5.11)
RDW: 18.9 % — ABNORMAL HIGH (ref 11.5–15.5)
WBC: 31.3 10*3/uL — ABNORMAL HIGH (ref 4.0–10.5)
nRBC: 0.3 % — ABNORMAL HIGH (ref 0.0–0.2)

## 2023-07-14 MED ORDER — HEPARIN (PORCINE) 25000 UT/250ML-% IV SOLN
1500.0000 [IU]/h | INTRAVENOUS | Status: DC
Start: 2023-07-14 — End: 2023-07-16
  Administered 2023-07-14: 1000 [IU]/h via INTRAVENOUS
  Administered 2023-07-15: 1450 [IU]/h via INTRAVENOUS
  Filled 2023-07-14 (×2): qty 250

## 2023-07-14 MED ORDER — ENOXAPARIN SODIUM 60 MG/0.6ML IJ SOSY: 60.0000 mg | PREFILLED_SYRINGE | Freq: Two times a day (BID) | INTRAMUSCULAR | Status: DC

## 2023-07-14 MED ORDER — IRBESARTAN 150 MG PO TABS
150.0000 mg | ORAL_TABLET | Freq: Every day | ORAL | Status: DC
  Administered 2023-07-15: 150 mg via ORAL
  Filled 2023-07-14: qty 1

## 2023-07-14 MED ORDER — HYDRALAZINE HCL 20 MG/ML IJ SOLN
10.0000 mg | Freq: Four times a day (QID) | INTRAMUSCULAR | Status: DC | PRN
  Administered 2023-07-16: 10 mg via INTRAVENOUS
  Filled 2023-07-14: qty 1

## 2023-07-14 MED ORDER — HEPARIN (PORCINE) 25000 UT/250ML-% IV SOLN: 1000.0000 [IU]/h | INTRAVENOUS | Status: DC

## 2023-07-14 NOTE — Plan of Care (Signed)
   Problem: Nutrition: Goal: Adequate nutrition will be maintained Outcome: Progressing

## 2023-07-14 NOTE — Assessment & Plan Note (Signed)
 Blood pressure uptrending Resuming home regimen of Benicar 

## 2023-07-14 NOTE — Assessment & Plan Note (Signed)
 As evidenced by biopsies performed on 1/26 and 1/28. Patient is status post right hemicolectomy on 1/28, surgery following and managing postop wound care, their assistance is appreciated. Oncology consultation performed with Dr. Loretha.  On 2/19 at 3:15AM Patient unsure as to whether she wishes to proceed with recommended outpatient oncology evaluation and treatment, instead wants a second opinion  Port placement recommended during this hospitalization however patient is declining for now, pending her second opinion.

## 2023-07-14 NOTE — Assessment & Plan Note (Signed)
 Patient initially presented with substantial anemia requiring blood transfusion Patient is additionally exhibited multiple bouts of hematochezia since operative intervention thought to be exacerbated by anticoagulation which has since been held for approximately 48 hours Resuming anticoagulation this evening, monitoring for recurrent bleeding.

## 2023-07-14 NOTE — Assessment & Plan Note (Signed)
 Seen by palliative care earlier in the hospitalization Patient wishes to remain full code Patient agreeable to referral to outpatient palliative medicine at [facility] at time of discharge

## 2023-07-14 NOTE — Assessment & Plan Note (Signed)
Nicotine patches

## 2023-07-14 NOTE — Progress Notes (Signed)
 PHARMACY - ANTICOAGULATION CONSULT NOTE  Pharmacy Consult for Heparin  Indication: DVT  Allergies  Allergen Reactions   Beef-Derived Drug Products Hives    Alpha-gal   Dog Epithelium (Canis Lupus Familiaris) Anaphylaxis   Pork-Derived Products Hives    Alpha-gal   Bee Venom Hives   Diclofenac Nausea And Vomiting   Lisinopril Other (See Comments)    Headaches    Patient Measurements: Height: 5' 5 (165.1 cm) Weight: 61.2 kg (135 lb) IBW/kg (Calculated) : 57 Heparin  Dosing Weight: TBW  Vital Signs: Temp: 98.3 F (36.8 C) (02/05 1316) Temp Source: Oral (02/05 0617) BP: 174/82 (02/05 1316) Pulse Rate: 87 (02/05 1316)  Labs: Recent Labs    07/12/23 0435 07/13/23 0503 07/14/23 0458  HGB 7.5* 7.5* 8.2*  HCT 24.8* 24.7* 26.8*  PLT 572* 615* 662*  CREATININE  --  0.76  --     Estimated Creatinine Clearance: 58.9 mL/min (by C-G formula based on SCr of 0.76 mg/dL).   Medical History: Past Medical History:  Diagnosis Date   Anxiety    Depression    DVT of axillary vein, acute left (HCC)    left subclavian vein   Hypertension    PONV (postoperative nausea and vomiting)    Pulmonary embolus (HCC)     Medications:  Infusions:   sodium chloride      heparin       Brief Assessment:  72 yoF restarting on Heparin  IV for DVT as hematchezia has resolved.  Heparin  level in process.  No reported bleeding, hematochezia.    Goal of Therapy:  Heparin  level 0.3-0.7 units/ml Monitor platelets by anticoagulation protocol: Yes   Plan:  Per MD, changed from Heparin  IV to Lovenox  1 mg/kg BID   Wanda Hasting PharmD, BCPS [facility] main pharmacy [phone] 07/14/2023 2:01 PM

## 2023-07-14 NOTE — Progress Notes (Signed)
 PHARMACY - ANTICOAGULATION CONSULT NOTE  Pharmacy Consult for Heparin  Indication: DVT  Allergies  Allergen Reactions   Beef-Derived Drug Products Hives    Alpha-gal   Dog Epithelium (Canis Lupus Familiaris) Anaphylaxis   Pork-Derived Products Hives    Alpha-gal   Bee Venom Hives   Diclofenac Nausea And Vomiting   Lisinopril Other (See Comments)    Headaches    Patient Measurements: Height: 5' 5 (165.1 cm) Weight: 61.2 kg (135 lb) IBW/kg (Calculated) : 57 Heparin  Dosing Weight: TBW  Vital Signs: Temp: 98.4 F (36.9 C) (02/05 2029) Temp Source: Oral (02/05 2029) BP: 176/72 (02/05 2029) Pulse Rate: 82 (02/05 2029)  Labs: Recent Labs    07/12/23 0435 07/13/23 0503 07/14/23 0458 07/14/23 2108  HGB 7.5* 7.5* 8.2*  --   HCT 24.8* 24.7* 26.8*  --   PLT 572* 615* 662*  --   HEPARINUNFRC  --   --   --  <0.10*  CREATININE  --  0.76  --   --     Estimated Creatinine Clearance: 58.9 mL/min (by C-G formula based on SCr of 0.76 mg/dL).   Medical History: Past Medical History:  Diagnosis Date   Anxiety    Depression    DVT of axillary vein, acute left (HCC)    left subclavian vein   Hypertension    PONV (postoperative nausea and vomiting)    Pulmonary embolus (HCC)     Medications:  Infusions:   sodium chloride       Brief Assessment:  72 yoF restarting on Heparin  IV for DVT as hematchezia has resolved.  Significant Events: Heparin  drip ordered earlier this afternoon and pt refused, MD changed to enoxaparin  (pt didn't receive dose) and then pt agreed to heparin    Goal of Therapy:  Heparin  level 0.3-0.7 units/ml Monitor platelets by anticoagulation protocol: Yes   Plan:  Per plan from earlier today start heparin  drip at 1000 units/hr, no bolus Heparin  level in 8 hours Daily CBC   Leeroy Mace RPh 07/14/2023, 10:13 PM

## 2023-07-14 NOTE — Assessment & Plan Note (Signed)
 Recent left upper extremity DVT with identification of superficial thrombosis involving the right basilic vein and right cephalic veins. It has been at least 48 hours since last episode of hematochezia, will place patient on heparin  infusion without bolus If patient tolerates anticoagulation with heparin  for at least 24 hours will transition back to previous regimen of Xarelto .

## 2023-07-14 NOTE — Progress Notes (Signed)
  This patient called nurse to room and showed nurse draining from he incision around the staples.  A tan purulent drainage with malodor.  Approx 3 cc.  Nurse cleaned drainage with plain guaze and covered it with new guaze  for any further drainage. Will report to on coming nurse for MD/surgeons notification.

## 2023-07-14 NOTE — Progress Notes (Signed)
 PHARMACY - ANTICOAGULATION CONSULT NOTE  Pharmacy Consult for Heparin  Indication: DVT  Allergies  Allergen Reactions   Beef-Derived Drug Products Hives    Alpha-gal   Dog Epithelium (Canis Lupus Familiaris) Anaphylaxis   Pork-Derived Products Hives    Alpha-gal   Bee Venom Hives   Diclofenac Nausea And Vomiting   Lisinopril Other (See Comments)    Headaches    Patient Measurements: Height: 5' 5 (165.1 cm) Weight: 61.2 kg (135 lb) IBW/kg (Calculated) : 57 Heparin  Dosing Weight:  61.2 kg  Vital Signs: Temp: 99.1 F (37.3 C) (02/05 0617) Temp Source: Oral (02/05 0617) BP: 146/78 (02/05 0617) Pulse Rate: 105 (02/05 0617)  Labs: Recent Labs    07/12/23 0435 07/13/23 0503 07/14/23 0458  HGB 7.5* 7.5* 8.2*  HCT 24.8* 24.7* 26.8*  PLT 572* 615* 662*  CREATININE  --  0.76  --     Estimated Creatinine Clearance: 58.9 mL/min (by C-G formula based on SCr of 0.76 mg/dL).   Medical History: Past Medical History:  Diagnosis Date   Anxiety    Depression    DVT of axillary vein, acute left (HCC)    left subclavian vein   Hypertension    PONV (postoperative nausea and vomiting)    Pulmonary embolus (HCC)     Assessment: TOC issues / Dispo:   AC/Heme:  recently diagnosed DVT in November 2024 on Xarelto  PTA (LD 1/22) >>IV heparin  for rectal bleeding - 05/05/23 left UE doppler: Occlusive DVT in the left subclavian and axillary veins. Nonocclusive superficial thrombus in the left basilic vein. - 1/26: colonoscopy with noted colon mass - 1/27 UE doppler: age indeterminate DVT involving the L distal axillary vein. Age indeterminate superficial vein thrombosis  involving  the left forearm portion of cephalic vein and left upper arm postion of basilic vein.  - 1/28:  right hemicolectomy. heparin  drip resumed postop - 1/31 issues getting correct level drawn as the clot is in one arm while infusion in other. Ok to change to Lovenox  per surg and TRH (Last dose LMWH 1/31 PM)  -  2/5: IV heparin , no bolus. Hematochezia resolved. Hgb 8.2. Plts 662  Goal of Therapy:  Heparin  level 0.3-0.7 units/ml Monitor platelets by anticoagulation protocol: Yes   Plan:  IV heparin  (no bolus requested) IV heparin  1000 units/hr Check heparin  level in 6 hrs. Daily HL and CBC     Abdinasir Spadafore S. Casimir, PharmD, BCPS Clinical Staff Pharmacist Casimir Salines Stillinger 07/14/2023,12:40 PM

## 2023-07-14 NOTE — Assessment & Plan Note (Signed)
 Repleted.

## 2023-07-14 NOTE — Assessment & Plan Note (Signed)
   No evidence of COPD exacerbation this time  Continue home regimen of maintenance inhalers  As needed bronchodilator therapy for episodic shortness of breath and wheezing.  

## 2023-07-14 NOTE — Progress Notes (Signed)
RUE venous duplex has been completed.  Preliminary results given to Darl Pikes, RN.   Results can be found under chart review under CV PROC. 07/14/2023 2:54 PM Fransico Sciandra RVT, RDMS

## 2023-07-14 NOTE — Progress Notes (Signed)
 PROGRESS NOTE   Tina Mccullough  FMW:[id number] DOB: [birth year] DOA: 07/01/2023 PCP: Loreli Kins, MD   Date of Service: the patient was seen and examined on 07/14/2023  Brief Narrative:  73 year old female history of lymphocytic colitis, COPD DVT PE hypertension recent admission on 11/28 for fall and DVT presented to the [facility] with 4 days of purple stool then some blood, abdominal pain on right side and had outpatient CT scan done that showed a mass and potentially some colitis and sent to the ED.  Patient was found to have obstructing masslike lesion in hepatic flexure suspicious for colon carcinoma dilated ascending colon with mild wall thickening suspicious for colitis mild right colonic wall thickening. Patient had colonoscopy 07/04/23 with poor prep showed large obstructing and infiltrative mass in the proximal transverse colon/hepatic flexure but able to advance the scope beyond this area biopsies taken.  Biopsy showed adenocarcinoma.  1/28 underwent right hemicolectomy> path showed invasive moderately differentiated adenocarcinoma extending through submucosa, muscularis propria and into subserosal adipose tissue with negative margins.   Assessment & Plan Colon adenocarcinoma [facility]) As evidenced by biopsies performed on 1/26 and 1/28. Patient is status post right hemicolectomy on 1/28, surgery following and managing postop wound care, their assistance is appreciated. Oncology consultation performed with Dr. Loretha.  On 2/19 at 3:15AM Patient unsure as to whether she wishes to proceed with recommended outpatient oncology evaluation and treatment, instead wants a second opinion  Port placement recommended during this hospitalization however patient is declining for now, pending her second opinion.  Acute deep vein thrombosis (DVT) of both upper extremities (HCC) Recent left upper extremity DVT with identification of superficial thrombosis involving the right basilic vein and  right cephalic veins. It has been at least 48 hours since last episode of hematochezia, will place patient on heparin  infusion without bolus If patient tolerates anticoagulation with heparin  for at least 24 hours will transition back to previous regimen of Xarelto . Acute lower GI bleeding Patient initially presented with substantial anemia requiring blood transfusion Patient is additionally exhibited multiple bouts of hematochezia since operative intervention thought to be exacerbated by anticoagulation which has since been held for approximately 48 hours Resuming anticoagulation this evening, monitoring for recurrent bleeding. Essential hypertension Blood pressure uptrending Resuming home regimen of Benicar  Nicotine  dependence, cigarettes, uncomplicated Nicotine  patches COPD (chronic obstructive pulmonary disease) (HCC) No evidence of COPD exacerbation this time Continue home regimen of maintenance inhalers As needed bronchodilator therapy for episodic shortness of breath and wheezing.  Hypokalemia Repleted Goals of care, counseling/discussion Seen by palliative care earlier in the hospitalization Patient wishes to remain full code Patient agreeable to referral to outpatient palliative medicine at [facility] at time of discharge     Subjective:  Patient complaining of abdominal pain, moderate to severe in intensity, sharp in quality, centering around the incisional wound, radiating diffusely.  Patient is additionally complaining of right upper extremity pain for the past several days.  Physical Exam:  Vitals:   07/13/23 2358 07/14/23 0617 07/14/23 1316 07/14/23 2029  BP: (!) 142/82 (!) 146/78 (!) 174/82 (!) 176/72  Pulse: 88 (!) 105 87 82  Resp: 18 18 18 16   Temp: 98.8 F (37.1 C) 99.1 F (37.3 C) 98.3 F (36.8 C) 98.4 F (36.9 C)  TempSrc: Oral Oral  Oral  SpO2: 98% 94% 93% 94%  Weight:      Height:        Constitutional: Awake alert and oriented x3, no associated  distress.   Skin:  Large ventral abdominal surgical wound with significant amounts of serosanguineous and slightly purulent drainage.  No significant foul smell noted. Eyes: Pupils are equally reactive to light.  No evidence of scleral icterus or conjunctival pallor.  ENMT: Moist mucous membranes noted.  Posterior pharynx clear of any exudate or lesions.   Respiratory: clear to auscultation bilaterally, no wheezing, no crackles. Normal respiratory effort. No accessory muscle use.  Cardiovascular: Regular rate and rhythm, no murmurs / rubs / gallops. No extremity edema. 2+ pedal pulses. No carotid bruits.  Abdomen: General Abdominal tenderness.  Hypoactive bowel sounds.  No evidence of intra-abdominal masses.  Musculoskeletal: No joint deformity upper and lower extremities. Good ROM, no contractures. Normal muscle tone.    Data Reviewed:  I have personally reviewed and interpreted labs, imaging.  Significant findings are   CBC: Recent Labs  Lab 07/10/23 1231 07/11/23 0504 07/12/23 0435 07/13/23 0503 07/14/23 0458  WBC 10.5 7.9 13.1* 14.8* 31.3*  HGB 7.7* 7.6* 7.5* 7.5* 8.2*  HCT 25.6* 24.7* 24.8* 24.7* 26.8*  MCV 80.3 79.9* 80.3 81.3 81.2  PLT 558* 523* 572* 615* 662*   Basic Metabolic Panel: Recent Labs  Lab 07/08/23 0731 07/09/23 0434 07/10/23 0529 07/11/23 0504 07/13/23 0503  NA 133* 133* 136 136 136  K 3.3* 3.8 4.4 3.9 3.8  CL 98 104 104 104 106  CO2 25 24 24 24 23   GLUCOSE 94 106* 130* 108* 88  BUN 16 21 25* 28* 26*  CREATININE 0.84 0.69 0.89 0.67 0.76  CALCIUM  8.4* 8.3* 8.9 8.6* 8.6*  MG 2.1 1.8  --   --   --    Code Status:  Full code.  Code status decision has been confirmed with: patient Family Communication: none    Severity of Illness:  The appropriate patient status for this patient is INPATIENT. Inpatient status is judged to be reasonable and necessary in order to provide the required intensity of service to ensure the patient's safety. The patient's  presenting symptoms, physical exam findings, and initial radiographic and laboratory data in the context of their chronic comorbidities is felt to place them at high risk for further clinical deterioration. Furthermore, it is not anticipated that the patient will be medically stable for discharge from the hospital within 2 midnights of admission.   * I certify that at the point of admission it is my clinical judgment that the patient will require inpatient hospital care spanning beyond 2 midnights from the point of admission due to high intensity of service, high risk for further deterioration and high frequency of surveillance required.*  Time spent:  59 minutes  Author:  Zachary JINNY Ba MD  07/14/2023 9:37 PM

## 2023-07-14 NOTE — Progress Notes (Signed)
 Progress Note  8 Days Post-Op  Subjective: CC Abdominal pain and bloating are overall stable but she is having more firm bowel movements which are no longer bright red bloody but appear dark/black - we recently started her on iron . She had drainage coming out her midline incision starting this am.  Objective: Vital signs in last 24 hours: Temp:  [98.6 F (37 C)-99.1 F (37.3 C)] 99.1 F (37.3 C) (02/05 0617) Pulse Rate:  [86-105] 105 (02/05 0617) Resp:  [16-18] 18 (02/05 0617) BP: (142-158)/(78-85) 146/78 (02/05 0617) SpO2:  [94 %-98 %] 94 % (02/05 0617) Last BM Date : 07/13/23  Intake/Output from previous day: 02/04 0701 - 02/05 0700 In: 840 [P.O.:840] Out: -  Intake/Output this shift: No intake/output data recorded.  PE: General: pleasant, WD, female who is laying in bed in NAD Lungs: Normal rate and effort Abd: Soft, mild distension, . Appropriately ttp around her incision - otherwise NT. No rigidity or guarding. Ongoing erythema around incision with purulent drainage. Staples removed and wound opened with copious purulent drainage. Fascial base intact. See below  MSK: No LE edema. RUE with mild erythema and edema distal to IV   Lab Results:  Recent Labs    07/13/23 0503 07/14/23 0458  WBC 14.8* 31.3*  HGB 7.5* 8.2*  HCT 24.7* 26.8*  PLT 615* 662*   BMET Recent Labs    07/13/23 0503  NA 136  K 3.8  CL 106  CO2 23  GLUCOSE 88  BUN 26*  CREATININE 0.76  CALCIUM  8.6*   PT/INR No results for input(s): LABPROT, INR in the last 72 hours. CMP     Component Value Date/Time   NA 136 07/13/2023 0503   K 3.8 07/13/2023 0503   CL 106 07/13/2023 0503   CO2 23 07/13/2023 0503   GLUCOSE 88 07/13/2023 0503   BUN 26 (H) 07/13/2023 0503   CREATININE 0.76 07/13/2023 0503   CALCIUM  8.6 (L) 07/13/2023 0503   PROT 6.0 (L) 07/02/2023 1039   ALBUMIN  2.9 (L) 07/02/2023 1039   AST 15 07/02/2023 1039   ALT 13 07/02/2023 1039   ALKPHOS 73 07/02/2023 1039    BILITOT 0.6 07/02/2023 1039   GFRNONAA >60 07/13/2023 0503   GFRAA >60 06/11/2018 1653   Lipase     Component Value Date/Time   LIPASE <10 (L) 07/01/2023 1652       Studies/Results: CT ABDOMEN PELVIS W CONTRAST Result Date: 07/13/2023 CLINICAL DATA:  Postop abdominal pain. Status post right colectomy for colon carcinoma. * Tracking Code: BO * EXAM: CT ABDOMEN AND PELVIS WITH CONTRAST TECHNIQUE: Multidetector CT imaging of the abdomen and pelvis was performed using the standard protocol following bolus administration of intravenous contrast. RADIATION DOSE REDUCTION: This exam was performed according to the departmental dose-optimization program which includes automated exposure control, adjustment of the mA and/or kV according to patient size and/or use of iterative reconstruction technique. CONTRAST:  OMNIPAQUE  IOHEXOL  300 MG/ML  SOLN COMPARISON:  07/01/2023 FINDINGS: Lower Chest: New small bilateral pleural effusions and bilateral lower lobe atelectasis, right side greater than left. Hepatobiliary: A few tiny sub-cm hepatic cysts remain stable. No suspicious hepatic masses identified. Gallbladder is unremarkable. No evidence of biliary ductal dilatation. Pancreas:  No mass or inflammatory changes. Spleen: Within normal limits in size and appearance. Adrenals/Urinary Tract: Stable bilateral adrenal hyperplasia. No suspicious masses identified. No evidence of ureteral calculi or hydronephrosis. Small amount of gas in urinary bladder is presumably due to recent catheterization. Stomach/Bowel: Postop  changes are seen from right hemicolectomy. No mass identified. Mildly dilated small bowel loops with air-fluid levels are seen, consistent with mild ileus. Colonic diverticulosis is again noted, without signs of diverticulitis. Mild ascites is seen, however, no abscess identified. Vascular/Lymphatic: No pathologically enlarged lymph nodes. No acute vascular findings. Reproductive: Tiny less than 1 cm  calcified uterine fibroid again noted. Vaginal pessary remains in place. Other:  Moderate diffuse body wall edema. Musculoskeletal:  No suspicious bone lesions identified. IMPRESSION: Postop changes from right hemicolectomy. No evidence of recurrent or metastatic disease. Mild postop ileus pattern. Mild ascites. No evidence of abscess. Moderate diffuse body wall edema. New small bilateral pleural effusions and bilateral lower lobe atelectasis, right side greater than left. Electronically Signed   By: Norleen DELENA Kil M.D.   On: 07/13/2023 16:49    Anti-infectives: Anti-infectives (From admission, onward)    Start     Dose/Rate Route Frequency Ordered Stop   07/06/23 0945  ceFAZolin  (ANCEF ) IVPB 2g/100 mL premix        2 g 200 mL/hr over 30 Minutes Intravenous On call to O.R. 07/06/23 0853 07/06/23 1221        Assessment/Plan POD8 s/p open R hemicolectomy 1/28 Dr. 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    - S/p c scope 1/26 Dr. Brambhatt - Biopsy with adenocarcinoma - CEA elevated to 71.3, CT CH without met disease - Surgical path c/w pT3pN0 adenocarcinoma - Dr Signe d/w patient. She has oncology follow up  - CT 2/4 with post op ileus pattern. No abscess. New small b/l pleural effusions - staples removed from midline with large amount of purulent drainage. Start bid wet to dry dressing changes - Hematochezia resolved. Reports dark/tarry stool but is on iron . Hgb able at 8. Will discuss with MD resuming hep gtt today (no bolus) - Encouraged mobilization and IS  - new RUE swelling - discussed with TRH   FEN: FLD ADAT soft, IVF per primary  ID: Ancef  periop VTE: Hep gtt on hold as above   Per primary HTN COPD tobacco use LUE dvt - on xarelto  pre op. Hep gtt held    LOS: 13 days   [place] VEAR Mais, [facility] 07/14/2023, 9:32 AM Please see Amion for pager number during day hours 7:00am-4:30pm

## 2023-07-14 NOTE — Telephone Encounter (Signed)
 Left patient a vm regarding upcoming appointment

## 2023-07-15 ENCOUNTER — Other Ambulatory Visit: Payer: Self-pay | Admitting: *Deleted

## 2023-07-15 ENCOUNTER — Inpatient Hospital Stay (HOSPITAL_COMMUNITY): Payer: PPO

## 2023-07-15 DIAGNOSIS — K922 Gastrointestinal hemorrhage, unspecified: Secondary | ICD-10-CM | POA: Diagnosis not present

## 2023-07-15 DIAGNOSIS — Z7189 Other specified counseling: Secondary | ICD-10-CM | POA: Diagnosis not present

## 2023-07-15 DIAGNOSIS — J449 Chronic obstructive pulmonary disease, unspecified: Secondary | ICD-10-CM | POA: Diagnosis not present

## 2023-07-15 DIAGNOSIS — C189 Malignant neoplasm of colon, unspecified: Secondary | ICD-10-CM | POA: Diagnosis not present

## 2023-07-15 DIAGNOSIS — I82623 Acute embolism and thrombosis of deep veins of upper extremity, bilateral: Secondary | ICD-10-CM | POA: Diagnosis not present

## 2023-07-15 DIAGNOSIS — Z515 Encounter for palliative care: Secondary | ICD-10-CM | POA: Diagnosis not present

## 2023-07-15 LAB — CBC WITH DIFFERENTIAL/PLATELET
Abs Immature Granulocytes: 0.56 10*3/uL — ABNORMAL HIGH (ref 0.00–0.07)
Basophils Absolute: 0 10*3/uL (ref 0.0–0.1)
Basophils Relative: 0 %
Eosinophils Absolute: 0.1 10*3/uL (ref 0.0–0.5)
Eosinophils Relative: 1 %
HCT: 26.2 % — ABNORMAL LOW (ref 36.0–46.0)
Hemoglobin: 7.9 g/dL — ABNORMAL LOW (ref 12.0–15.0)
Immature Granulocytes: 3 %
Lymphocytes Relative: 7 %
Lymphs Abs: 1.2 10*3/uL (ref 0.7–4.0)
MCH: 24.5 pg — ABNORMAL LOW (ref 26.0–34.0)
MCHC: 30.2 g/dL (ref 30.0–36.0)
MCV: 81.4 fL (ref 80.0–100.0)
Monocytes Absolute: 0.9 10*3/uL (ref 0.1–1.0)
Monocytes Relative: 5 %
Neutro Abs: 16 10*3/uL — ABNORMAL HIGH (ref 1.7–7.7)
Neutrophils Relative %: 84 %
Platelets: 928 10*3/uL (ref 150–400)
RBC: 3.22 MIL/uL — ABNORMAL LOW (ref 3.87–5.11)
RDW: 19 % — ABNORMAL HIGH (ref 11.5–15.5)
WBC: 18.8 10*3/uL — ABNORMAL HIGH (ref 4.0–10.5)
nRBC: 0.2 % (ref 0.0–0.2)

## 2023-07-15 LAB — COMPREHENSIVE METABOLIC PANEL
ALT: 19 U/L (ref 0–44)
AST: 21 U/L (ref 15–41)
Albumin: 2.4 g/dL — ABNORMAL LOW (ref 3.5–5.0)
Alkaline Phosphatase: 82 U/L (ref 38–126)
Anion gap: 8 (ref 5–15)
BUN: 23 mg/dL (ref 8–23)
CO2: 25 mmol/L (ref 22–32)
Calcium: 8.7 mg/dL — ABNORMAL LOW (ref 8.9–10.3)
Chloride: 102 mmol/L (ref 98–111)
Creatinine, Ser: 0.75 mg/dL (ref 0.44–1.00)
GFR, Estimated: >60 mL/min (ref >60)
Glucose, Bld: 92 mg/dL (ref 70–99)
Potassium: 4.1 mmol/L (ref 3.5–5.1)
Sodium: 135 mmol/L (ref 135–145)
Total Bilirubin: 0.6 mg/dL (ref 0.0–1.2)
Total Protein: 5.5 g/dL — ABNORMAL LOW (ref 6.5–8.1)

## 2023-07-15 LAB — HEPARIN LEVEL (UNFRACTIONATED)
Heparin Unfractionated: <0.1 [IU]/mL — ABNORMAL LOW (ref 0.30–0.70)
Heparin Unfractionated: 0.28 [IU]/mL — ABNORMAL LOW (ref 0.30–0.70)

## 2023-07-15 LAB — MAGNESIUM: Magnesium: 1.9 mg/dL (ref 1.7–2.4)

## 2023-07-15 LAB — PROCALCITONIN: Procalcitonin: 13.11 ng/mL

## 2023-07-15 LAB — URINALYSIS, ROUTINE W REFLEX MICROSCOPIC
Bilirubin Urine: NEGATIVE
Glucose, UA: NEGATIVE mg/dL
Hgb urine dipstick: NEGATIVE
Ketones, ur: 20 mg/dL — AB
Leukocytes,Ua: NEGATIVE
Nitrite: NEGATIVE
Protein, ur: NEGATIVE mg/dL
Specific Gravity, Urine: 1.026 (ref 1.005–1.030)
pH: 5 (ref 5.0–8.0)

## 2023-07-15 MED ORDER — SODIUM CHLORIDE 0.9 % IV SOLN
2.0000 g | INTRAVENOUS | Status: DC
  Filled 2023-07-15: qty 20

## 2023-07-15 MED ORDER — IRBESARTAN 150 MG PO TABS
300.0000 mg | ORAL_TABLET | Freq: Every day | ORAL | Status: DC
  Administered 2023-07-16 – 2023-07-17 (×2): 300 mg via ORAL
  Filled 2023-07-15 (×2): qty 2

## 2023-07-15 MED ORDER — SODIUM CHLORIDE 0.9 % IV SOLN: 2.0000 g | INTRAVENOUS | Status: DC

## 2023-07-15 MED ORDER — HEPARIN BOLUS VIA INFUSION
2000.0000 [IU] | INTRAVENOUS | Status: AC
  Administered 2023-07-15: 2000 [IU] via INTRAVENOUS
  Filled 2023-07-15: qty 2000

## 2023-07-15 NOTE — Plan of Care (Signed)
   Problem: Education: Goal: Knowledge of General Education information will improve Description Including pain rating scale, medication(s)/side effects and non-pharmacologic comfort measures Outcome: Progressing

## 2023-07-15 NOTE — Progress Notes (Signed)
 Daily Progress Note   Patient Name: Tina Mccullough       Date: 07/15/2023 DOB: [birth year]  Age: 73 y.o. MRN#: [id number] Attending Physician: Kenard Zachary PARAS, MD Primary Care Physician: Loreli Kins, MD Admit Date: 07/01/2023  Reason for Consultation/Follow-up: Non pain symptom management and Pain control  Subjective:  Awake alert sitting up in bed, we talked about pain management, supportive care and reviewed her hospital course thus far and her recent serious illness diagnosis  Length of Stay: 14  Current Medications: Scheduled Meds:  . acetaminophen   1,000 mg Oral Q6H  . vitamin C   500 mg Oral Daily  . feeding supplement  237 mL Oral BID BM  . ferrous sulfate   325 mg Oral BID WC  . irbesartan   150 mg Oral Daily  . multivitamin with minerals  1 tablet Oral Daily  . pantoprazole   40 mg Oral Daily  . polycarbophil  625 mg Oral BID  . sodium chloride  flush  3 mL Intravenous Q12H    Continuous Infusions: . sodium chloride     . heparin  1,000 Units/hr (07/14/23 2243)    PRN Meds: sodium chloride , alum & mag hydroxide-simeth, bisacodyl , hydrALAZINE , HYDROmorphone  (DILAUDID ) injection, lip balm, magic mouthwash, menthol -cetylpyridinium, methocarbamol , naphazoline-glycerin , nicotine , ondansetron  **OR** ondansetron  (ZOFRAN ) IV, oxyCODONE , phenol, simethicone , sodium chloride , sodium chloride  flush  Physical Exam         Awake alert NAD Resting in bed Regular work of breathing Abdominal wound images noted on the chart from surgical colleagues documentation No edema  Vital Signs: BP (!) 171/73 (BP Location: Right Leg)   Pulse 77   Temp 98 F (36.7 C) (Oral)   Resp 18   Ht 5' 5 (1.651 m)   Wt 61.2 kg   SpO2 94%   BMI 22.47 kg/m  SpO2: SpO2: 94 % O2 Device: O2  Device: Room Air O2 Flow Rate: O2 Flow Rate (L/min): 2 L/min  Intake/output summary:  Intake/Output Summary (Last 24 hours) at 07/15/2023 1009 Last data filed at 07/15/2023 0631 Gross per 24 hour  Intake 242.57 ml  Output --  Net 242.57 ml   LBM: Last BM Date : 07/13/23 Baseline Weight: Weight: 65.4 kg Most recent weight: Weight: 61.2 kg       Palliative Assessment/Data:      Patient Active Problem List  Diagnosis Date Noted  . Acute lower GI bleeding 07/14/2023  . Colon adenocarcinoma (HCC) 07/09/2023  . Need for emotional support 07/09/2023  . Goals of care, counseling/discussion 07/09/2023  . Palliative care encounter 07/06/2023  . Hypokalemia 07/01/2023  . Abnormal ECG 05/06/2023  . Constipation 05/06/2023  . Acute deep vein thrombosis (DVT) of both upper extremities (HCC) 05/05/2023  . Essential hypertension 05/05/2023  . Nicotine  dependence, cigarettes, uncomplicated 05/05/2023  . COPD (chronic obstructive pulmonary disease) (HCC) 05/05/2023  . Multiple rib fractures 05/05/2023    Palliative Care Assessment & Plan   Patient Profile:    Assessment:  Colon adeno carcinoma Acute DVT both UE 88-year-old female history of lymphocytic colitis, COPD DVT PE hypertension  Recommendations/Plan:  Continue current pain regimen of PO Oxy IR PRN - 10 mg * 3 doses in the past 24 hours = 30 mg total. Also has Tylenol  PO. Also utilized some IV Dilaudid  PRN in the past 24 hours.  Patient has appointment with Palliative Care at [facility] with my colleague Ms Cousar NP on 07-28-23 at 2 PM.  Continue current mode of care.  Supportive care: Acknowledged the patient's thoughts and feelings towards her recent serious illness diagnosis and this current hospitalization. 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 Patient is eager to  have acute issues resolved so that she can be discharged and proceed with cancer directed treatments soon.    Code Status:    Code Status Orders  (From admission, onward)           Start     Ordered   07/01/23 2312  Full code  Continuous       Question:  By:  Answer:  Consent: discussion documented in EHR   07/01/23 2313           Code Status History     Date Active Date Inactive Code Status Order ID Comments User Context   05/05/2023 2042 05/06/2023 1852 Full Code [id number]  Silvester Ales, MD Inpatient      Advance Directive Documentation    Flowsheet Row Most Recent Value  Type of Advance Directive Healthcare Power of Attorney, Living will  Pre-existing out of facility DNR order (yellow form or pink MOST form) --  MOST Form in Place? --       Prognosis:  Unable to determine  Discharge Planning: Home with Home Health  Care plan was discussed with patient.   Thank you for allowing the Palliative Medicine Team to assist in the care of this patient.  Mod MDM     Greater than 50%  of this time was spent counseling and coordinating care related to the above assessment and plan.  Lonia Serve, MD  Please contact Palliative Medicine Team phone at [phone] for questions and concerns.

## 2023-07-15 NOTE — Assessment & Plan Note (Signed)
Nicotine patches

## 2023-07-15 NOTE — Assessment & Plan Note (Signed)
   No evidence of COPD exacerbation this time  Continue home regimen of maintenance inhalers  As needed bronchodilator therapy for episodic shortness of breath and wheezing.  

## 2023-07-15 NOTE — Progress Notes (Signed)
 Progress Note  9 Days Post-Op  Subjective: CC No BM for 2 days so she has not had one since starting back on hep gtt. Did not eat much yesterday due to low appetite. Abdominal pain and nausea overall stable  Objective: Vital signs in last 24 hours: Temp:  [98 F (36.7 C)-98.4 F (36.9 C)] 98 F (36.7 C) (02/06 0525) Pulse Rate:  [77-87] 77 (02/06 0525) Resp:  [16-18] 18 (02/06 0525) BP: (171-176)/(72-82) 171/73 (02/06 0525) SpO2:  [93 %-94 %] 94 % (02/06 0525) Last BM Date : 07/13/23  Intake/Output from previous day: 02/05 0701 - 02/06 0700 In: 242.6 [P.O.:240; I.V.:2.6] Out: -  Intake/Output this shift: No intake/output data recorded.  PE: General: pleasant, WD, female who is laying in bed in NAD Lungs: Normal rate and effort Abd: Soft, mild distension, . Appropriately ttp around her incision - otherwise NT. No rigidity or guarding. Midline wound with purulent drainage saturating gauze and abd. Dressing change by me this am. Fascial base intact. See below  MSK: No LE edema. RUE with mild erythema and edema distal to IV   Lab Results:  Recent Labs    07/13/23 0503 07/14/23 0458  WBC 14.8* 31.3*  HGB 7.5* 8.2*  HCT 24.7* 26.8*  PLT 615* 662*   BMET Recent Labs    07/13/23 0503  NA 136  K 3.8  CL 106  CO2 23  GLUCOSE 88  BUN 26*  CREATININE 0.76  CALCIUM  8.6*   PT/INR No results for input(s): LABPROT, INR in the last 72 hours. CMP     Component Value Date/Time   NA 136 07/13/2023 0503   K 3.8 07/13/2023 0503   CL 106 07/13/2023 0503   CO2 23 07/13/2023 0503   GLUCOSE 88 07/13/2023 0503   BUN 26 (H) 07/13/2023 0503   CREATININE 0.76 07/13/2023 0503   CALCIUM  8.6 (L) 07/13/2023 0503   PROT 6.0 (L) 07/02/2023 1039   ALBUMIN  2.9 (L) 07/02/2023 1039   AST 15 07/02/2023 1039   ALT 13 07/02/2023 1039   ALKPHOS 73 07/02/2023 1039   BILITOT 0.6 07/02/2023 1039   GFRNONAA >60 07/13/2023 0503   GFRAA >60 06/11/2018 1653   Lipase     Component  Value Date/Time   LIPASE <10 (L) 07/01/2023 1652       Studies/Results: VAS US  UPPER EXTREMITY VENOUS DUPLEX Result Date: 07/14/2023 UPPER VENOUS STUDY  Patient Name:  KALLAN BISCHOFF  Date of Exam:   07/14/2023 Medical Rec #: [id number]               Accession #:    [id number] Date of Birth: 11/17/1950               Patient Gender: F Patient Age:   32 years Exam Location:  [facility] Procedure:      VAS US  UPPER EXTREMITY VENOUS DUPLEX Referring Phys: ZACHARY BA --------------------------------------------------------------------------------  Indications: Swelling Risk Factors: Cancer DVT 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 Anticoagulation: Xarelto  prior to admission,, heparin  during admission that was stopped briefly for surgical procedure. Comparison Study: Previous exam on 07/05/23 (LUEV) positive for DVT in axillary                   and SVT in basilic first seen on 05/05/2023 Performing Technologist: Ezzie Potters RVT, RDMS  Examination Guidelines: A complete evaluation includes B-mode imaging, spectral Doppler, color Doppler, and power Doppler as needed of all accessible portions of each vessel.  Bilateral testing is considered an integral part of a complete examination. Limited examinations for reoccurring indications may be performed as noted.  Right Findings: +----------+------------+---------+-----------+----------+-------+ RIGHT     CompressiblePhasicitySpontaneousPropertiesSummary +----------+------------+---------+-----------+----------+-------+ IJV           Full       Yes       Yes                      +----------+------------+---------+-----------+----------+-------+ Subclavian    Full       Yes       Yes                      +----------+------------+---------+-----------+----------+-------+ Axillary      Full       Yes       Yes                      +----------+------------+---------+-----------+----------+-------+ Brachial      Full       Yes        Yes                      +----------+------------+---------+-----------+----------+-------+ Radial        Full                                          +----------+------------+---------+-----------+----------+-------+ Ulnar         Full                                          +----------+------------+---------+-----------+----------+-------+ Cephalic      None       No        No                Acute  +----------+------------+---------+-----------+----------+-------+ Basilic     Partial      Yes       Yes               Acute  +----------+------------+---------+-----------+----------+-------+  Left Findings: +----------+------------+---------+-----------+----------+-------+ LEFT      CompressiblePhasicitySpontaneousPropertiesSummary +----------+------------+---------+-----------+----------+-------+ Subclavian    Full       Yes       Yes                      +----------+------------+---------+-----------+----------+-------+  Summary:  Right: No evidence of deep vein thrombosis in the upper extremity. Findings consistent with acute superficial vein thrombosis involving the right basilic vein and right cephalic vein.  Left: No evidence of thrombosis in the subclavian.  *See table(s) above for measurements and observations.  Diagnosing physician: Gaile New MD Electronically signed by Gaile New MD on 07/14/2023 at 5:54:03 PM.    Final    CT ABDOMEN PELVIS W CONTRAST Result Date: 07/13/2023 CLINICAL DATA:  Postop abdominal pain. Status post right colectomy for colon carcinoma. * Tracking Code: BO * EXAM: CT ABDOMEN AND PELVIS WITH CONTRAST TECHNIQUE: Multidetector CT imaging of the abdomen and pelvis was performed using the standard protocol following bolus administration of intravenous contrast. RADIATION DOSE REDUCTION: This exam was performed according to the departmental dose-optimization program which includes automated exposure control, adjustment of the mA  and/or kV according to patient size and/or use of iterative reconstruction technique.  CONTRAST:  OMNIPAQUE  IOHEXOL  300 MG/ML  SOLN COMPARISON:  07/01/2023 FINDINGS: Lower Chest: New small bilateral pleural effusions and bilateral lower lobe atelectasis, right side greater than left. Hepatobiliary: A few tiny sub-cm hepatic cysts remain stable. No suspicious hepatic masses identified. Gallbladder is unremarkable. No evidence of biliary ductal dilatation. Pancreas:  No mass or inflammatory changes. Spleen: Within normal limits in size and appearance. Adrenals/Urinary Tract: Stable bilateral adrenal hyperplasia. No suspicious masses identified. No evidence of ureteral calculi or hydronephrosis. Small amount of gas in urinary bladder is presumably due to recent catheterization. Stomach/Bowel: Postop changes are seen from right hemicolectomy. No mass identified. Mildly dilated small bowel loops with air-fluid levels are seen, consistent with mild ileus. Colonic diverticulosis is again noted, without signs of diverticulitis. Mild ascites is seen, however, no abscess identified. Vascular/Lymphatic: No pathologically enlarged lymph nodes. No acute vascular findings. Reproductive: Tiny less than 1 cm calcified uterine fibroid again noted. Vaginal pessary remains in place. Other:  Moderate diffuse body wall edema. Musculoskeletal:  No suspicious bone lesions identified. IMPRESSION: Postop changes from right hemicolectomy. No evidence of recurrent or metastatic disease. Mild postop ileus pattern. Mild ascites. No evidence of abscess. Moderate diffuse body wall edema. New small bilateral pleural effusions and bilateral lower lobe atelectasis, right side greater than left. Electronically Signed   By: Norleen DELENA Kil M.D.   On: 07/13/2023 16:49    Anti-infectives: Anti-infectives (From admission, onward)    Start     Dose/Rate Route Frequency Ordered Stop   07/06/23 0945  ceFAZolin  (ANCEF ) IVPB 2g/100 mL premix         2 g 200 mL/hr over 30 Minutes Intravenous On call to O.R. 07/06/23 0853 07/06/23 1221        Assessment/Plan POD9 s/p open R hemicolectomy 1/28 Dr. 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    - S/p c scope 1/26 Dr. Brambhatt - Biopsy with adenocarcinoma - CEA elevated to 71.3, CT CH without met disease - Surgical path c/w pT3pN0 adenocarcinoma - Dr Signe d/w patient. She has oncology follow up  - CT 2/4 with post op ileus pattern. No abscess. New small b/l pleural effusions - staples removed from midline with large amount of purulent drainage 2/5.  - start wound vac today with next change Monday. HH orders in  - Hematochezia resolved. Reports dark/tarry stool but is on iron . Hep gtt resumed 2/5  - WBC down to 18.8 - cont to trend. Hgb overall stable at 7.9 - Encouraged mobilization and IS  - new RUE swelling . SVT on US    FEN: soft, IVF per primary  ID: Ancef  periop VTE: Hep gtt   Per primary HTN COPD tobacco use LUE dvt - on xarelto  pre op. Hep gtt    LOS: 14 days   [place] VEAR Mais, [facility] 07/15/2023, 8:34 AM Please see Amion for pager number during day hours 7:00am-4:30pm

## 2023-07-15 NOTE — Assessment & Plan Note (Signed)
 As evidenced by biopsies performed on 1/26 and 1/28. Patient is status post right hemicolectomy on 1/28, surgery following and managing postop wound care, their assistance is appreciated. Oncology consultation performed with Dr. Loretha.  On 2/19 at 3:15AM Patient unsure as to whether she wishes to proceed with recommended outpatient oncology evaluation and treatment, instead wants a second opinion  Port placement recommended during this hospitalization however patient is declining for now, pending her second opinion.

## 2023-07-15 NOTE — Plan of Care (Signed)

## 2023-07-15 NOTE — Assessment & Plan Note (Addendum)
 Patient initially presented with substantial anemia requiring 1 unit packed red blood cell transfusion Patient is additionally exhibited multiple bouts of hematochezia since operative intervention thought to be exacerbated by anticoagulation which has since been held for approximately 48 hours No evidence of bleeding while on heparin , will transition to Xarelto  in the morning.

## 2023-07-15 NOTE — Assessment & Plan Note (Signed)
 Seen by palliative care earlier in the hospitalization Patient wishes to remain full code Patient agreeable to referral to outpatient palliative medicine at [facility] at time of discharge

## 2023-07-15 NOTE — Progress Notes (Signed)
 PHARMACY - ANTICOAGULATION CONSULT NOTE  Pharmacy Consult for Heparin  Indication: DVT  Allergies  Allergen Reactions   Beef-Derived Drug Products Hives    Alpha-gal   Dog Epithelium (Canis Lupus Familiaris) Anaphylaxis   Pork-Derived Products Hives    Alpha-gal   Bee Venom Hives   Diclofenac Nausea And Vomiting   Lisinopril Other (See Comments)    Headaches    Patient Measurements: Height: 5' 5 (165.1 cm) Weight: 61.2 kg (135 lb) IBW/kg (Calculated) : 57 Heparin  Dosing Weight: TBW  Vital Signs: Temp: 98 F (36.7 C) (02/06 0525) Temp Source: Oral (02/06 0525) BP: 171/73 (02/06 0525) Pulse Rate: 77 (02/06 0525)  Labs: Recent Labs    07/13/23 0503 07/14/23 0458 07/14/23 2108 07/15/23 0910  HGB 7.5* 8.2*  --  7.9*  HCT 24.7* 26.8*  --  26.2*  PLT 615* 662*  --  928*  HEPARINUNFRC  --   --  <0.10* <0.10*  CREATININE 0.76  --   --  0.75    Estimated Creatinine Clearance: 58.9 mL/min (by C-G formula based on SCr of 0.75 mg/dL).   Medical History: Past Medical History:  Diagnosis Date   Anxiety    Depression    DVT of axillary vein, acute left (HCC)    left subclavian vein   Hypertension    PONV (postoperative nausea and vomiting)    Pulmonary embolus (HCC)     Medications:  Infusions:   sodium chloride      heparin  1,000 Units/hr (07/14/23 2243)    Brief Assessment:  72 yoF restarting on Heparin  IV for DVT as hematchezia has resolved.  1/29-1/30 Patient was previously therapeutic on IV heparin  1450 units/hr  Today, 07/15/23 09:10 heparin  level <0.1, sub-therapeutic, with heparin  infusing at 1000 units/hr No bleeding and no interruption in IV heparin  infusion per nurse   Significant Events: Heparin  drip ordered earlier this afternoon and pt refused, MD changed to enoxaparin  (pt didn't receive dose) and then pt agreed to heparin    Goal of Therapy:  Heparin  level 0.3-0.7 units/ml Monitor platelets by anticoagulation protocol: Yes   Plan:   2000 units IV heparin  bolus via infusion then increase IV heparin  continuous infusion to 1450 units/hr Heparin  level in 8 hours following rate change Monitor daily heparin  level, CBC, signs/symptoms of bleeding    Thank you for allowing pharmacy to be a part of this patient's care.  Eleanor EMERSON Agent, PharmD, BCPS Clinical Pharmacist [place] 07/15/2023 11:17 AM

## 2023-07-15 NOTE — Assessment & Plan Note (Addendum)
 Blood pressure uptrending Blood pressure still are not at target, will increase Avapro  to 300 mg daily.

## 2023-07-15 NOTE — Progress Notes (Signed)
 The proposed treatment discussed in conference is for discussion purpose only and is not a binding recommendation.  The patients have not been physically examined, or presented with their treatment options.  Therefore, final treatment plans cannot be decided.

## 2023-07-15 NOTE — Progress Notes (Signed)
 Pharmacy: Re- heparin   Patient is a 73 y.o F with hx recent left UE DVT in Nov 2024 on xarelto  PTA who presented to the ED on 07/01/23 with abdominal pain and hematochezia with outpatient CT scan done on the same day, that showed a colon mass. 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  Her DOAC was held on admission and she was transitioned to heparin  drip on 07/04/22. Heparin  changed to LMWH on 07/09/23, stopped on 2/1 d/t bloody stool and anticoag with heparin  drip resumed on 07/14/23 after resolution of hematochezia.   - Repeat UE doppler on 07/05/23 showed age indeterminate deep vein thrombosis involving the left distal axillary vein.     - heparin  level collected at 8:47p is sub- therapeutic at 0.28 - cbc somewhat stable  - pharmacy has been consulted on 07/14/22 to transition patient to xarelto  on 07/15/22 AM.    Goal of Therapy:  Heparin  level 0.3-0.7 units/ml Monitor platelets by anticoagulation protocol: Yes  Plan: -  Increase heparin  drip to 1500 units/hr -  check 8 hr heparin  level  -  will plan on transitioning her back to xarelto  on 07/16/23  - monitor for s/sx bleeding   Iantha Batch, PharmD, BCPS 07/15/2023 9:11 PM

## 2023-07-15 NOTE — Consult Note (Signed)
 WOC Nurse Consult Note: Reason for Consult:Asked to apply NPWT (VAC) therapy to midline wound.  Wound type: midline surgical, dehiscence Pressure Injury POA: NA Measurement:11 cm x 3.4 cm x 3 cm  Wound bed: beefy red Drainage (amount, consistency, odor) minimal serosanguinous no odor Periwound:intact Dressing procedure/placement/frequency: 1 piece black foam to wound bed.  Cover with drape.  Seal achieved at 125 MMhg.  Change Monday and Thursday.  Will follow.  Darice Cooley MSN, RN, FNP-BC CWON Wound, Ostomy, Continence Nurse [facility] [phone] Pager [phone]

## 2023-07-15 NOTE — Assessment & Plan Note (Signed)
 Repleted.

## 2023-07-15 NOTE — Assessment & Plan Note (Addendum)
 Recent left upper extremity DVT with identification of superficial thrombosis involving the right basilic vein and right cephalic veins. No evidence of recurrent GI bleeding since initiation of heparin , will likely transition to oral Xarelto  by the morning.

## 2023-07-15 NOTE — Progress Notes (Signed)
 PROGRESS NOTE   Tina Mccullough  FMW:[id number] DOB: October 05, 1950 DOA: 07/01/2023 PCP: Loreli Kins, MD   Date of Service: the patient was seen and examined on 07/15/2023  Brief Narrative:  73 year old female history of lymphocytic colitis, COPD DVT PE hypertension recent admission on 11/28 for fall and DVT presented to the [facility] with 4 days of purple stool then some blood, abdominal pain on right side and had outpatient CT scan done that showed a mass and potentially some colitis and sent to the ED.  Patient was found to have obstructing masslike lesion in hepatic flexure suspicious for colon carcinoma dilated ascending colon with mild wall thickening suspicious for colitis mild right colonic wall thickening. Patient had colonoscopy 07/04/23 with poor prep showed large obstructing and infiltrative mass in the proximal transverse colon/hepatic flexure but able to advance the scope beyond this area biopsies taken.  Biopsy showed adenocarcinoma.  1/28 underwent right hemicolectomy> path showed invasive moderately differentiated adenocarcinoma extending through submucosa, muscularis propria and into subserosal adipose tissue with negative margins.   Assessment & Plan Colon adenocarcinoma [facility]) As evidenced by biopsies performed on 1/26 and 1/28. Patient is status post right hemicolectomy on 1/28, surgery following and managing postop wound care, their assistance is appreciated. Oncology consultation performed with Dr. Loretha.  On 2/19 at 3:15AM Patient unsure as to whether she wishes to proceed with recommended outpatient oncology evaluation and treatment, instead wants a second opinion  Port placement recommended during this hospitalization however patient is declining for now, pending her second opinion.  Acute deep vein thrombosis (DVT) of both upper extremities (HCC) Recent left upper extremity DVT with identification of superficial thrombosis involving the right basilic vein and  right cephalic veins. No evidence of recurrent GI bleeding since initiation of heparin , will likely transition to oral Xarelto  by the morning. Acute lower GI bleeding Patient initially presented with substantial anemia requiring 1 unit packed red blood cell transfusion Patient is additionally exhibited multiple bouts of hematochezia since operative intervention thought to be exacerbated by anticoagulation which has since been held for approximately 48 hours No evidence of bleeding while on heparin , will transition to Xarelto  in the morning. Essential hypertension Blood pressure uptrending Blood pressure still are not at target, will increase Avapro  to 300 mg daily. Nicotine  dependence, cigarettes, uncomplicated Nicotine  patches COPD (chronic obstructive pulmonary disease) (HCC) No evidence of COPD exacerbation this time Continue home regimen of maintenance inhalers As needed bronchodilator therapy for episodic shortness of breath and wheezing.  Hypokalemia Repleted Goals of care, counseling/discussion Seen by palliative care earlier in the hospitalization Patient wishes to remain full code Patient agreeable to referral to outpatient palliative medicine at [facility] at time of discharge     Subjective:  Patient complaining of abdominal pain, mild to moderate intensity.  Pain is diffuse, radiating distally.  Physical Exam:  Vitals:   07/14/23 0617 07/14/23 1316 07/14/23 2029 07/15/23 0525  BP: (!) 146/78 (!) 174/82 (!) 176/72 (!) 171/73  Pulse: (!) 105 87 82 77  Resp: 18 18 16 18   Temp: 99.1 F (37.3 C) 98.3 F (36.8 C) 98.4 F (36.9 C) 98 F (36.7 C)  TempSrc: Oral  Oral Oral  SpO2: 94% 93% 94% 94%  Weight:      Height:        Constitutional: Awake alert and oriented x3, no associated distress.   Skin: Large ventral abdominal surgical wound with significant amounts of serosanguineous and slightly purulent drainage.  No significant foul smell noted. Eyes:  Pupils are equally  reactive to light.  No evidence of scleral icterus or conjunctival pallor.  ENMT: Moist mucous membranes noted.  Posterior pharynx clear of any exudate or lesions.   Respiratory: clear to auscultation bilaterally, no wheezing, no crackles. Normal respiratory effort. No accessory muscle use.  Cardiovascular: Regular rate and rhythm, no murmurs / rubs / gallops. No extremity edema. 2+ pedal pulses. No carotid bruits.  Abdomen: General Abdominal tenderness.  Hypoactive bowel sounds.  No evidence of intra-abdominal masses.  Musculoskeletal: No joint deformity upper and lower extremities. Good ROM, no contractures. Normal muscle tone.    Data Reviewed:  I have personally reviewed and interpreted labs, imaging.  Significant findings are   CBC: Recent Labs  Lab 07/10/23 1231 07/11/23 0504 07/12/23 0435 07/13/23 0503 07/14/23 0458  WBC 10.5 7.9 13.1* 14.8* 31.3*  HGB 7.7* 7.6* 7.5* 7.5* 8.2*  HCT 25.6* 24.7* 24.8* 24.7* 26.8*  MCV 80.3 79.9* 80.3 81.3 81.2  PLT 558* 523* 572* 615* 662*   Basic Metabolic Panel: Recent Labs  Lab 07/09/23 0434 07/10/23 0529 07/11/23 0504 07/13/23 0503  NA 133* 136 136 136  K 3.8 4.4 3.9 3.8  CL 104 104 104 106  CO2 24 24 24 23   GLUCOSE 106* 130* 108* 88  BUN 21 25* 28* 26*  CREATININE 0.69 0.89 0.67 0.76  CALCIUM  8.3* 8.9 8.6* 8.6*  MG 1.8  --   --   --    Code Status:  Full code.  Code status decision has been confirmed with: patient Family Communication: none    Severity of Illness:  The appropriate patient status for this patient is INPATIENT. Inpatient status is judged to be reasonable and necessary in order to provide the required intensity of service to ensure the patient's safety. The patient's presenting symptoms, physical exam findings, and initial radiographic and laboratory data in the context of their chronic comorbidities is felt to place them at high risk for further clinical deterioration. Furthermore, it is not anticipated that  the patient will be medically stable for discharge from the hospital within 2 midnights of admission.   * I certify that at the point of admission it is my clinical judgment that the patient will require inpatient hospital care spanning beyond 2 midnights from the point of admission due to high intensity of service, high risk for further deterioration and high frequency of surveillance required.*  Time spent:  50 minutes  Author:  Zachary JINNY Ba MD  07/15/2023 9:12 AM

## 2023-07-16 ENCOUNTER — Other Ambulatory Visit (HOSPITAL_COMMUNITY): Payer: Self-pay

## 2023-07-16 DIAGNOSIS — Z7189 Other specified counseling: Secondary | ICD-10-CM | POA: Diagnosis not present

## 2023-07-16 DIAGNOSIS — T8149XA Infection following a procedure, other surgical site, initial encounter: Secondary | ICD-10-CM | POA: Insufficient documentation

## 2023-07-16 DIAGNOSIS — C189 Malignant neoplasm of colon, unspecified: Secondary | ICD-10-CM | POA: Diagnosis not present

## 2023-07-16 DIAGNOSIS — Z515 Encounter for palliative care: Secondary | ICD-10-CM | POA: Diagnosis not present

## 2023-07-16 LAB — CBC
HCT: 24.2 % — ABNORMAL LOW (ref 36.0–46.0)
Hemoglobin: 7.4 g/dL — ABNORMAL LOW (ref 12.0–15.0)
MCH: 24.5 pg — ABNORMAL LOW (ref 26.0–34.0)
MCHC: 30.6 g/dL (ref 30.0–36.0)
MCV: 80.1 fL (ref 80.0–100.0)
Platelets: 768 10*3/uL — ABNORMAL HIGH (ref 150–400)
RBC: 3.02 MIL/uL — ABNORMAL LOW (ref 3.87–5.11)
RDW: 19.3 % — ABNORMAL HIGH (ref 11.5–15.5)
WBC: 14.3 10*3/uL — ABNORMAL HIGH (ref 4.0–10.5)
nRBC: 0.3 % — ABNORMAL HIGH (ref 0.0–0.2)

## 2023-07-16 LAB — COMPREHENSIVE METABOLIC PANEL
ALT: 15 U/L (ref 0–44)
AST: 12 U/L — ABNORMAL LOW (ref 15–41)
Albumin: 1.9 g/dL — ABNORMAL LOW (ref 3.5–5.0)
Alkaline Phosphatase: 64 U/L (ref 38–126)
Anion gap: 7 (ref 5–15)
BUN: 22 mg/dL (ref 8–23)
CO2: 25 mmol/L (ref 22–32)
Calcium: 8 mg/dL — ABNORMAL LOW (ref 8.9–10.3)
Chloride: 104 mmol/L (ref 98–111)
Creatinine, Ser: 0.67 mg/dL (ref 0.44–1.00)
GFR, Estimated: >60 mL/min (ref >60)
Glucose, Bld: 88 mg/dL (ref 70–99)
Potassium: 3.3 mmol/L — ABNORMAL LOW (ref 3.5–5.1)
Sodium: 136 mmol/L (ref 135–145)
Total Bilirubin: 0.4 mg/dL (ref 0.0–1.2)
Total Protein: 4.8 g/dL — ABNORMAL LOW (ref 6.5–8.1)

## 2023-07-16 LAB — FERRITIN: Ferritin: 52 ng/mL (ref 11–307)

## 2023-07-16 LAB — IRON AND TIBC
Iron: 16 ug/dL — ABNORMAL LOW (ref 28–170)
Saturation Ratios: 7 % — ABNORMAL LOW (ref 10.4–31.8)
TIBC: 216 ug/dL — ABNORMAL LOW (ref 250–450)
UIBC: 200 ug/dL

## 2023-07-16 LAB — HEPARIN LEVEL (UNFRACTIONATED): Heparin Unfractionated: 0.39 [IU]/mL (ref 0.30–0.70)

## 2023-07-16 LAB — FOLATE: Folate: 4.9 ng/mL — ABNORMAL LOW (ref >5.9)

## 2023-07-16 LAB — PHOSPHORUS: Phosphorus: 2.8 mg/dL (ref 2.5–4.6)

## 2023-07-16 LAB — MAGNESIUM: Magnesium: 1.8 mg/dL (ref 1.7–2.4)

## 2023-07-16 LAB — VITAMIN B12: Vitamin B-12: 457 pg/mL (ref 180–914)

## 2023-07-16 MED ORDER — AMOXICILLIN-POT CLAVULANATE 875-125 MG PO TABS
1.0000 | ORAL_TABLET | Freq: Two times a day (BID) | ORAL | Status: DC
Start: 2023-07-16 — End: 2023-07-17
  Administered 2023-07-16 – 2023-07-17 (×2): 1 via ORAL
  Filled 2023-07-16 (×2): qty 1

## 2023-07-16 MED ORDER — METHOCARBAMOL 500 MG PO TABS
500.0000 mg | ORAL_TABLET | Freq: Four times a day (QID) | ORAL | 0 refills | Status: DC | PRN
  Filled 2023-07-16: qty 20, 5d supply, fill #0

## 2023-07-16 MED ORDER — OXYCODONE HCL 5 MG PO TABS
5.0000 mg | ORAL_TABLET | ORAL | 0 refills | Status: DC | PRN
  Filled 2023-07-16: qty 30, 5d supply, fill #0

## 2023-07-16 MED ORDER — POTASSIUM CHLORIDE CRYS ER 20 MEQ PO TBCR
40.0000 meq | EXTENDED_RELEASE_TABLET | Freq: Two times a day (BID) | ORAL | Status: AC
  Administered 2023-07-16 (×2): 40 meq via ORAL
  Filled 2023-07-16 (×2): qty 2

## 2023-07-16 MED ORDER — DOCUSATE SODIUM 100 MG PO CAPS
100.0000 mg | ORAL_CAPSULE | Freq: Two times a day (BID) | ORAL | Status: DC
  Filled 2023-07-16 (×3): qty 1

## 2023-07-16 MED ORDER — CEFTRIAXONE SODIUM 2 G IJ SOLR
2.0000 g | Freq: Once | INTRAMUSCULAR | Status: AC
  Administered 2023-07-16: 2 g via INTRAVENOUS

## 2023-07-16 MED ORDER — AMLODIPINE BESYLATE 5 MG PO TABS
5.0000 mg | ORAL_TABLET | Freq: Every day | ORAL | Status: DC
  Administered 2023-07-16 – 2023-07-17 (×2): 5 mg via ORAL
  Filled 2023-07-16 (×2): qty 1

## 2023-07-16 MED ORDER — ONDANSETRON HCL 4 MG PO TABS
4.0000 mg | ORAL_TABLET | Freq: Four times a day (QID) | ORAL | 0 refills | Status: DC | PRN
  Filled 2023-07-16: qty 20, 5d supply, fill #0

## 2023-07-16 MED ORDER — DOXYCYCLINE HYCLATE 100 MG PO TABS
100.0000 mg | ORAL_TABLET | Freq: Two times a day (BID) | ORAL | Status: DC
  Administered 2023-07-16 – 2023-07-17 (×3): 100 mg via ORAL
  Filled 2023-07-16 (×3): qty 1

## 2023-07-16 MED ORDER — RIVAROXABAN 10 MG PO TABS
20.0000 mg | ORAL_TABLET | Freq: Every day | ORAL | Status: DC
  Administered 2023-07-16 – 2023-07-17 (×2): 20 mg via ORAL
  Filled 2023-07-16 (×2): qty 2

## 2023-07-16 MED ORDER — MAGNESIUM CHLORIDE 64 MG PO TBEC
2.0000 | DELAYED_RELEASE_TABLET | Freq: Once | ORAL | Status: AC
  Administered 2023-07-16: 128 mg via ORAL
  Filled 2023-07-16: qty 2

## 2023-07-16 MED ORDER — ACETAMINOPHEN 500 MG PO TABS: 1000.0000 mg | ORAL_TABLET | Freq: Four times a day (QID) | ORAL | Status: DC | PRN

## 2023-07-16 MED ORDER — POTASSIUM CHLORIDE 20 MEQ PO PACK
40.0000 meq | PACK | Freq: Two times a day (BID) | ORAL | Status: DC
  Administered 2023-07-16: 40 meq via ORAL
  Filled 2023-07-16: qty 2

## 2023-07-16 NOTE — Assessment & Plan Note (Addendum)
 Recent left upper extremity DVT with identification of superficial thrombosis involving the right basilic vein and right cephalic veins. Patient now transition to Xarelto  today, anticipate lifelong therapy

## 2023-07-16 NOTE — Progress Notes (Signed)
 PROGRESS NOTE   Tina Mccullough  FMW:[id number] DOB: Dec 02, 1950 DOA: 07/01/2023 PCP: Loreli Kins, MD   Date of Service: the patient was seen and examined on 07/16/2023  Brief Narrative:  73 year old female history of lymphocytic colitis, COPD DVT PE hypertension recent admission on 11/28 for fall and DVT presented to the [facility] with 4 days of purple stool then some blood, abdominal pain on right side and had outpatient CT scan done that showed a mass and potentially some colitis and sent to the ED.  Patient was found to have obstructing masslike lesion in hepatic flexure suspicious for colon carcinoma dilated ascending colon with mild wall thickening suspicious for colitis mild right colonic wall thickening. Patient had colonoscopy 07/04/23 with poor prep showed large obstructing and infiltrative mass in the proximal transverse colon/hepatic flexure but able to advance the scope beyond this area biopsies taken.  Biopsy showed adenocarcinoma.  1/28 underwent right hemicolectomy> path showed invasive moderately differentiated adenocarcinoma extending through submucosa, muscularis propria and into subserosal adipose tissue with negative margins.   Assessment & Plan Surgical site infection Improving leukocytosis with wound VAC in place  Ceftriaxone  and doxycycline  administered today, plan to transition to Omnicef and doxycycline  tomorrow prior to anticipated discharge.  Colon adenocarcinoma (HCC) As evidenced by biopsies performed on 1/26 and 1/28. Patient is status post right hemicolectomy on 1/28, surgery following and managing postop wound care, their assistance is appreciated. Oncology consultation performed with Dr. Loretha.  On 2/19 at 3:15AM Patient unsure as to whether she wishes to proceed with recommended outpatient oncology evaluation and treatment, instead wants a second opinion  Port placement recommended during this hospitalization however patient is declining for now,  pending her second opinion.  Acute deep vein thrombosis (DVT) of both upper extremities (HCC) Recent left upper extremity DVT with identification of superficial thrombosis involving the right basilic vein and right cephalic veins. Patient now transition to Xarelto  today, anticipate lifelong therapy Acute lower GI bleeding Patient initially presented with substantial anemia requiring 1 unit packed red blood cell transfusion Patient is additionally exhibited multiple bouts of hematochezia since operative intervention thought to be exacerbated by anticoagulation No evidence of bleeding while on heparin , now transitioned to Xarelto  Essential hypertension Blood pressure uptrending Blood pressure still are not at target on Avapro  300 mg daily. Adding amlodipine  5 mg daily Nicotine  dependence, cigarettes, uncomplicated Nicotine  patches COPD (chronic obstructive pulmonary disease) (HCC) No evidence of COPD exacerbation this time Continue home regimen of maintenance inhalers As needed bronchodilator therapy for episodic shortness of breath and wheezing.  Hypokalemia Recurrent Oral potassium chloride  replacement Goals of care, counseling/discussion Seen by palliative care earlier in the hospitalization Patient wishes to remain full code Patient agreeable to referral to outpatient palliative medicine at [facility] at time of discharge     Subjective:  Patient complaining of mild to moderate generalized abdominal pain.  Pain is diffuse, radiating distally.  Pain is worse with movement and improved with rest.  Physical Exam:  Vitals:   07/15/23 1358 07/15/23 2102 07/16/23 0526 07/16/23 0647  BP: (!) 161/75 (!) 165/79 (!) 182/86 (!) 177/76  Pulse: 93 84 77 86  Resp: 18 15    Temp: 98 F (36.7 C) 98.2 F (36.8 C) 98.2 F (36.8 C)   TempSrc: Oral Oral Oral   SpO2: 94% 93% 97%   Weight:      Height:        Constitutional: Awake alert and oriented x3, no associated distress.   Skin: Large  ventral abdominal surgical wound with significant amounts of serosanguineous and slightly purulent drainage.  No significant foul smell noted. Eyes: Pupils are equally reactive to light.  No evidence of scleral icterus or conjunctival pallor.  ENMT: Moist mucous membranes noted.  Posterior pharynx clear of any exudate or lesions.   Respiratory: clear to auscultation bilaterally, no wheezing, no crackles. Normal respiratory effort. No accessory muscle use.  Cardiovascular: Regular rate and rhythm, no murmurs / rubs / gallops. No extremity edema. 2+ pedal pulses. No carotid bruits.  Abdomen: General Abdominal tenderness.  Hypoactive bowel sounds.  No evidence of intra-abdominal masses.  Musculoskeletal: No joint deformity upper and lower extremities. Good ROM, no contractures. Normal muscle tone.    Data Reviewed:  I have personally reviewed and interpreted labs, imaging.  Significant findings are   CBC: Recent Labs  Lab 07/12/23 0435 07/13/23 0503 07/14/23 0458 07/15/23 0910 07/16/23 0451  WBC 13.1* 14.8* 31.3* 18.8* 14.3*  NEUTROABS  --   --   --  16.0*  --   HGB 7.5* 7.5* 8.2* 7.9* 7.4*  HCT 24.8* 24.7* 26.8* 26.2* 24.2*  MCV 80.3 81.3 81.2 81.4 80.1  PLT 572* 615* 662* 928* 768*   Basic Metabolic Panel: Recent Labs  Lab 07/10/23 0529 07/11/23 0504 07/13/23 0503 07/15/23 0910 07/16/23 0451  NA 136 136 136 135 136  K 4.4 3.9 3.8 4.1 3.3*  CL 104 104 106 102 104  CO2 24 24 23 25 25   GLUCOSE 130* 108* 88 92 88  BUN 25* 28* 26* 23 22  CREATININE 0.89 0.67 0.76 0.75 0.67  CALCIUM  8.9 8.6* 8.6* 8.7* 8.0*  MG  --   --   --  1.9 1.8  PHOS  --   --   --   --  2.8   Code Status:  Full code.  Code status decision has been confirmed with: patient Family Communication: none    Severity of Illness:  The appropriate patient status for this patient is INPATIENT. Inpatient status is judged to be reasonable and necessary in order to provide the required intensity of service to  ensure the patient's safety. The patient's presenting symptoms, physical exam findings, and initial radiographic and laboratory data in the context of their chronic comorbidities is felt to place them at high risk for further clinical deterioration. Furthermore, it is not anticipated that the patient will be medically stable for discharge from the hospital within 2 midnights of admission.   * I certify that at the point of admission it is my clinical judgment that the patient will require inpatient hospital care spanning beyond 2 midnights from the point of admission due to high intensity of service, high risk for further deterioration and high frequency of surveillance required.*  Time spent:  40 minutes  Author:  Zachary JINNY Ba MD  07/16/2023 8:22 AM

## 2023-07-16 NOTE — Assessment & Plan Note (Addendum)
 Recurrent Oral potassium chloride  replacement

## 2023-07-16 NOTE — Assessment & Plan Note (Signed)
   No evidence of COPD exacerbation this time  Continue home regimen of maintenance inhalers  As needed bronchodilator therapy for episodic shortness of breath and wheezing.  

## 2023-07-16 NOTE — Assessment & Plan Note (Signed)
Nicotine patches

## 2023-07-16 NOTE — Progress Notes (Signed)
 PHARMACY - ANTICOAGULATION CONSULT NOTE  Pharmacy Consult for Heparin  Indication: DVT  Allergies  Allergen Reactions   Beef-Derived Drug Products Hives    Alpha-gal   Dog Epithelium (Canis Lupus Familiaris) Anaphylaxis   Pork-Derived Products Hives    Alpha-gal   Bee Venom Hives   Diclofenac Nausea And Vomiting   Lisinopril Other (See Comments)    Headaches    Patient Measurements: Height: 5' 5 (165.1 cm) Weight: 61.2 kg (135 lb) IBW/kg (Calculated) : 57 Heparin  Dosing Weight: TBW  Vital Signs: Temp: 98.2 F (36.8 C) (02/06 2102) Temp Source: Oral (02/06 2102) BP: 165/79 (02/06 2102) Pulse Rate: 84 (02/06 2102)  Labs: Recent Labs    07/14/23 0458 07/14/23 2108 07/15/23 0910 07/15/23 2047 07/16/23 0451  HGB 8.2*  --  7.9*  --  7.4*  HCT 26.8*  --  26.2*  --  24.2*  PLT 662*  --  928*  --  768*  HEPARINUNFRC  --    < > <0.10* 0.28* 0.39  CREATININE  --   --  0.75  --   --    < > = values in this interval not displayed.    Estimated Creatinine Clearance: 58.9 mL/min (by C-G formula based on SCr of 0.75 mg/dL).   Medical History: Past Medical History:  Diagnosis Date   Anxiety    Depression    DVT of axillary vein, acute left (HCC)    left subclavian vein   Hypertension    PONV (postoperative nausea and vomiting)    Pulmonary embolus (HCC)     Medications:  Infusions:   sodium chloride      cefTRIAXone  (ROCEPHIN )  IV     heparin  1,500 Units/hr (07/15/23 2135)    Brief Assessment:  72 yoF restarting on Heparin  IV for DVT as hematchezia has resolved.  1/29-1/30 Patient was previously therapeutic on IV heparin  1450 units/hr   Significant Events: Heparin  drip ordered earlier this afternoon and pt refused, MD changed to enoxaparin  (pt didn't receive dose) and then pt agreed to heparin    07/16/2023 HL 0.39 therapeutic on 1500 units/hr Hgb low but stable, plts elevated No complications of therapy noted  Goal of Therapy:  Heparin  level 0.3-0.7  units/ml Monitor platelets by anticoagulation protocol: Yes   Plan:  Continue heparin  drip at 1500 units/hr Tx back to xarelto  today (with a meal)    Thank you for allowing pharmacy to be a part of this patient's care.  Leeroy Mace RPh 07/16/2023, 5:27 AM

## 2023-07-16 NOTE — Plan of Care (Signed)
   Problem: Education: Goal: Knowledge of General Education information will improve Description: Including pain rating scale, medication(s)/side effects and non-pharmacologic comfort measures Outcome: Progressing   Problem: Nutrition: Goal: Adequate nutrition will be maintained Outcome: Progressing

## 2023-07-16 NOTE — Progress Notes (Addendum)
 Progress Note  10 Days Post-Op  Subjective: CC Tolerating diet and appetite improving. Had a BM that was dark but no blood. Pain overall controlled  Objective: Vital signs in last 24 hours: Temp:  [98 F (36.7 C)-98.2 F (36.8 C)] 98 F (36.7 C) (02/07 1333) Pulse Rate:  [77-93] 84 (02/07 1333) Resp:  [15-18] 16 (02/07 1333) BP: (161-182)/(75-86) 172/79 (02/07 1333) SpO2:  [93 %-97 %] 97 % (02/07 1333) Last BM Date : 07/13/23  Intake/Output from previous day: 02/06 0701 - 02/07 0700 In: 1308 [P.O.:960; I.V.:348] Out: 0  Intake/Output this shift: Total I/O In: 460 [P.O.:360; IV Piggyback:100] Out: 0   PE: General: pleasant, WD, female who is laying in bed in NAD Lungs: Normal rate and effort Abd: Soft, mild distension, . Appropriately ttp around her incision - otherwise NT. No rigidity or guarding. Midline wound with wound vac in place with good suction - cloudy yellow discharge in canister MSK: No LE edema. RUE with mild erythema and edema distal to IV   Lab Results:  Recent Labs    07/15/23 0910 07/16/23 0451  WBC 18.8* 14.3*  HGB 7.9* 7.4*  HCT 26.2* 24.2*  PLT 928* 768*   BMET Recent Labs    07/15/23 0910 07/16/23 0451  NA 135 136  K 4.1 3.3*  CL 102 104  CO2 25 25  GLUCOSE 92 88  BUN 23 22  CREATININE 0.75 0.67  CALCIUM  8.7* 8.0*   PT/INR No results for input(s): LABPROT, INR in the last 72 hours. CMP     Component Value Date/Time   NA 136 07/16/2023 0451   K 3.3 (L) 07/16/2023 0451   CL 104 07/16/2023 0451   CO2 25 07/16/2023 0451   GLUCOSE 88 07/16/2023 0451   BUN 22 07/16/2023 0451   CREATININE 0.67 07/16/2023 0451   CALCIUM  8.0 (L) 07/16/2023 0451   PROT 4.8 (L) 07/16/2023 0451   ALBUMIN  1.9 (L) 07/16/2023 0451   AST 12 (L) 07/16/2023 0451   ALT 15 07/16/2023 0451   ALKPHOS 64 07/16/2023 0451   BILITOT 0.4 07/16/2023 0451   GFRNONAA >60 07/16/2023 0451   GFRAA >60 06/11/2018 1653   Lipase     Component Value Date/Time    LIPASE <10 (L) 07/01/2023 1652       Studies/Results: DG Chest 1 View Result Date: 07/15/2023 CLINICAL DATA:  Pneumonia EXAM: CHEST  1 VIEW COMPARISON:  CT chest, 07/02/2023, chest radiographs, 07/02/2023 FINDINGS: The heart size and mediastinal contours are within normal limits. Elevation of the right hemidiaphragm atelectasis or consolidation of the right lung base likely associated with a small pleural effusion. Left lung normally aerated. The visualized skeletal structures are unremarkable. IMPRESSION: Elevation of the right hemidiaphragm with atelectasis or consolidation of the right lung base likely associated with a small pleural effusion. Radiographic findings similar to recent CT. Electronically Signed   By: Marolyn JONETTA Jaksch M.D.   On: 07/15/2023 14:18    Anti-infectives: Anti-infectives (From admission, onward)    Start     Dose/Rate Route Frequency Ordered Stop   07/16/23 2200  amoxicillin -clavulanate (AUGMENTIN ) 875-125 MG per tablet 1 tablet        1 tablet Oral Every 12 hours 07/16/23 0852     07/16/23 1000  doxycycline  (VIBRA -TABS) tablet 100 mg        100 mg Oral Every 12 hours 07/16/23 0851 07/23/23 0959   07/16/23 0945  cefTRIAXone  (ROCEPHIN ) 2 g in sodium chloride  0.9 % 100 mL IVPB  2 g 200 mL/hr over 30 Minutes Intravenous  Once 07/16/23 0853 07/16/23 0953   07/16/23 0900  cefTRIAXone  (ROCEPHIN ) 2 g in sodium chloride  0.9 % 100 mL IVPB  Status:  Discontinued        2 g 200 mL/hr over 30 Minutes Intravenous Every 24 hours 07/15/23 2103 07/16/23 0853   07/15/23 2000  cefTRIAXone  (ROCEPHIN ) 2 g in sodium chloride  0.9 % 100 mL IVPB  Status:  Discontinued        2 g 200 mL/hr over 30 Minutes Intravenous Every 24 hours 07/15/23 1912 07/15/23 1912   07/06/23 0945  ceFAZolin  (ANCEF ) IVPB 2g/100 mL premix        2 g 200 mL/hr over 30 Minutes Intravenous On call to O.R. 07/06/23 0853 07/06/23 1221        Assessment/Plan POD10 s/p open R hemicolectomy 1/28 Dr.  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    - S/p c scope 1/26 Dr. Brambhatt - Biopsy with adenocarcinoma - CEA elevated to 71.3, CT CH without met disease - Surgical path c/w [id number] adenocarcinoma - Dr Signe d/w patient. She has oncology follow up  - CT 2/4 with post op ileus pattern. No abscess. New small b/l pleural effusions - staples removed from midline with large amount of purulent drainage 2/5.  - wound vac 2/6 with next change Monday. M/th schedule. HH orders in. Drainage cloudy yellow today - Hematochezia resolved. Reports dark/tarry stool but is on iron . Hep gtt resumed 2/5 . Transitioned to doac - WBC down to 14. Hgb overall stable at 7.4. now on PO abx - Encouraged mobilization and IS. Encouraged trying to utilize only PO pain meds today  Hopefully home in next 24 hours.    FEN: soft, IVF per primary, replete K and mag ID: Ancef  periop VTE: Hep gtt   Per primary HTN COPD tobacco use LUE dvt - on xarelto  pre op. Hep gtt RUE SVT   LOS: 15 days   [place] VEAR Mais, [facility] Surgery 07/16/2023, 1:37 PM Please see Amion for pager number during day hours 7:00am-4:30pm

## 2023-07-16 NOTE — Progress Notes (Signed)
 Daily Progress Note   Patient Name: Tina Mccullough       Date: 07/16/2023 DOB: 05/19/1951  Age: 73 y.o. MRN#: [id number] Attending Physician: Kenard Zachary PARAS, MD Primary Care Physician: Loreli Kins, MD Admit Date: 07/01/2023  Reason for Consultation/Follow-up: Non pain symptom management and Pain control  Subjective:  Awake alert sitting up in bed, is good spirits today, pain reasonably well controlled in the abdomen, now has wound vac.    Length of Stay: 15  Current Medications: Scheduled Meds:  . acetaminophen   1,000 mg Oral Q6H  . amoxicillin -clavulanate  1 tablet Oral Q12H  . vitamin C   500 mg Oral Daily  . doxycycline   100 mg Oral Q12H  . feeding supplement  237 mL Oral BID BM  . ferrous sulfate   325 mg Oral BID WC  . irbesartan   300 mg Oral Daily  . multivitamin with minerals  1 tablet Oral Daily  . pantoprazole   40 mg Oral Daily  . polycarbophil  625 mg Oral BID  . potassium chloride   40 mEq Oral BID  . rivaroxaban   20 mg Oral Q breakfast  . sodium chloride  flush  3 mL Intravenous Q12H    Continuous Infusions: . sodium chloride       PRN Meds: sodium chloride , alum & mag hydroxide-simeth, bisacodyl , hydrALAZINE , HYDROmorphone  (DILAUDID ) injection, lip balm, magic mouthwash, menthol -cetylpyridinium, methocarbamol , naphazoline-glycerin , nicotine , ondansetron  **OR** ondansetron  (ZOFRAN ) IV, oxyCODONE , phenol, simethicone , sodium chloride , sodium chloride  flush  Physical Exam         Awake alert NAD Resting in bed Regular work of breathing Abdominal wound images noted on the chart from surgical colleagues documentation No edema  Vital Signs: BP (!) 177/76   Pulse 86   Temp 98.2 F (36.8 C) (Oral)   Resp 15   Ht 5' 5 (1.651 m)   Wt 61.2 kg   SpO2  97%   BMI 22.47 kg/m  SpO2: SpO2: 97 % O2 Device: O2 Device: Room Air O2 Flow Rate: O2 Flow Rate (L/min): 2 L/min  Intake/output summary:  Intake/Output Summary (Last 24 hours) at 07/16/2023 1112 Last data filed at 07/16/2023 1000 Gross per 24 hour  Intake 1428.02 ml  Output 0 ml  Net 1428.02 ml   LBM: Last BM Date : 07/13/23 Baseline Weight: Weight: 65.4 kg Most recent weight: Weight:  61.2 kg       Palliative Assessment/Data:      Patient Active Problem List   Diagnosis Date Noted  . Surgical site infection 07/16/2023  . Acute lower GI bleeding 07/14/2023  . Colon adenocarcinoma (HCC) 07/09/2023  . Need for emotional support 07/09/2023  . Goals of care, counseling/discussion 07/09/2023  . Palliative care encounter 07/06/2023  . Hypokalemia 07/01/2023  . Abnormal ECG 05/06/2023  . Constipation 05/06/2023  . Acute deep vein thrombosis (DVT) of both upper extremities (HCC) 05/05/2023  . Essential hypertension 05/05/2023  . Nicotine  dependence, cigarettes, uncomplicated 05/05/2023  . COPD (chronic obstructive pulmonary disease) (HCC) 05/05/2023  . Multiple rib fractures 05/05/2023    Palliative Care Assessment & Plan   Patient Profile:    Assessment:  Colon adeno carcinoma Acute DVT both UE 89-year-old female history of lymphocytic colitis, COPD DVT PE hypertension  Recommendations/Plan:  Continue current pain regimen of PO Oxy IR PRN   Also has Tylenol  PO. Also utilized some IV Dilaudid  PRN in the past 24 hours.  Patient has appointment with Palliative Care at [facility] with my colleague Ms Cousar NP on 07-28-23 at 2 PM.  Continue current mode of care.  Supportive care: Acknowledged the patient's thoughts and feelings towards her recent serious illness diagnosis and this current hospitalization. 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 Patient is eager to have acute issues resolved so that she can be discharged and proceed with cancer directed treatments soon. Patient asking about when she will be discharged today.    Code Status:    Code Status Orders  (From admission, onward)           Start     Ordered   07/01/23 2312  Full code  Continuous       Question:  By:  Answer:  Consent: discussion documented in EHR   07/01/23 2313           Code Status History     Date Active Date Inactive Code Status Order ID Comments User Context   05/05/2023 2042 05/06/2023 1852 Full Code [id number]  Silvester Ales, MD Inpatient      Advance Directive Documentation    Flowsheet Row Most Recent Value  Type of Advance Directive Healthcare Power of Attorney, Living will  Pre-existing out of facility DNR order (yellow form or pink MOST form) --  MOST Form in Place? --       Prognosis:  Unable to determine  Discharge Planning: Home with Home Health  Care plan was discussed with patient.   Thank you for allowing the Palliative Medicine Team to assist in the care of this patient.  Mod MDM     Greater than 50%  of this time was spent counseling and coordinating care related to the above assessment and plan.  Lonia Serve, MD  Please contact Palliative Medicine Team phone at [phone] for questions and concerns.

## 2023-07-16 NOTE — Plan of Care (Signed)
   Problem: Education: Goal: Knowledge of General Education information will improve Description Including pain rating scale, medication(s)/side effects and non-pharmacologic comfort measures Outcome: Progressing   Problem: Health Behavior/Discharge Planning: Goal: Ability to manage health-related needs will improve Outcome: Progressing

## 2023-07-16 NOTE — Progress Notes (Signed)
 PHARMACY - ANTICOAGULATION CONSULT NOTE  Pharmacy Consult for Xarelto  Indication: DVT  Allergies  Allergen Reactions   Beef-Derived Drug Products Hives    Alpha-gal   Dog Epithelium (Canis Lupus Familiaris) Anaphylaxis   Pork-Derived Products Hives    Alpha-gal   Bee Venom Hives   Diclofenac Nausea And Vomiting   Lisinopril Other (See Comments)    Headaches    Patient Measurements: Height: 5' 5 (165.1 cm) Weight: 61.2 kg (135 lb) IBW/kg (Calculated) : 57   Vital Signs: Temp: 98.2 F (36.8 C) (02/07 0526) Temp Source: Oral (02/07 0526) BP: 177/76 (02/07 0647) Pulse Rate: 86 (02/07 0647)  Labs: Recent Labs    07/14/23 0458 07/14/23 2108 07/15/23 0910 07/15/23 2047 07/16/23 0451  HGB 8.2*  --  7.9*  --  7.4*  HCT 26.8*  --  26.2*  --  24.2*  PLT 662*  --  928*  --  768*  HEPARINUNFRC  --    < > <0.10* 0.28* 0.39  CREATININE  --   --  0.75  --  0.67   < > = values in this interval not displayed.    Estimated Creatinine Clearance: 58.9 mL/min (by C-G formula based on SCr of 0.67 mg/dL).   Medical History: Past Medical History:  Diagnosis Date   Anxiety    Depression    DVT of axillary vein, acute left (HCC)    left subclavian vein   Hypertension    PONV (postoperative nausea and vomiting)    Pulmonary embolus (HCC)     Assessment: AC/Heme:  recently diagnosed DVT in November 2024 (LD 1/22) on Xarelto  PTA>>IV heparin  for rectal bleeding - 05/05/23 left UE doppler: Occlusive DVT in the left subclavian and axillary veins. Nonocclusive superficial thrombus in the left basilic vein. - 1/26: colonoscopy with noted colon mass - 1/27 UE doppler: age indeterminate DVT involving the L distal axillary vein. Age indeterminate superficial vein thrombosis  involving  the left forearm portion of cephalic vein and left upper arm postion of basilic vein.  - 1/28:  right hemicolectomy. heparin  drip resumed postop - 1/31 issues getting correct level drawn as the clot is  in one arm while infusion in other. Ok to change to Lovenox  per surg and TRH (Last dose LMWH 1/31 PM) - 2/5: IV heparin , no bolus. Hematochezia resolved. Hgb 8.2. Plts 662 - 2/7: Hep level 0.39 in goal>>d/c IV heparin . Resume Xarelto  20mg /d, Hgb 7.4 remains low, Plts 768  Goal of Therapy:  Therapeutic oral anticoagulation   Plan:  D/c IV heparin  Resume Xarelto  20mg  daily   Taylyn Brame S. Casimir, PharmD, BCPS Clinical Staff Pharmacist Casimir Salines Stillinger 07/16/2023,7:32 AM

## 2023-07-16 NOTE — Assessment & Plan Note (Addendum)
 Blood pressure uptrending Blood pressure still are not at target on Avapro  300 mg daily. Adding amlodipine  5 mg daily

## 2023-07-16 NOTE — TOC Progression Note (Addendum)
 Transition of Care [facility]) - Progression Note    Patient Details  Name: Tina Mccullough MRN: [id number] Date of Birth: 1951-01-02  Transition of Care [facility]) CM/SW Contact  Sonda Manuella Quill, RN Phone Number: 07/16/2023, 12:59 PM  Clinical Narrative:    Orders received for [facility] for vac dressing change M, TH; spoke w/ pt in room; she agrees to recc service; pt says she does not have an agency preference; contacted Greig Cedar at [facility]; she will see if agency can provide service; awaiting update.  -[id number]- notified by Amy that agency can provide service w/ start of care on next Thursday 07/22/23; also LVM for Randine Pope at [facility] as pt has [employer] wound vac; awaiting return call; contact info for [facility] placed in follow up provider section of d/c instructions.  -1505- call back from Cheriton at [facility]; she was given pt location and need for vac dressing changes on M and Th; she will send email for Glendale Mais, PA for e-script.      Expected Discharge Plan and Services                                               Social Determinants of Health (SDOH) Interventions SDOH Screenings   Food Insecurity: No Food Insecurity (07/02/2023)  Housing: Low Risk  (07/02/2023)  Transportation Needs: No Transportation Needs (07/02/2023)  Utilities: Not At Risk (07/02/2023)  Social Connections: Moderately Integrated (07/03/2023)  Recent Concern: Social Connections - Socially Isolated (07/03/2023)  Tobacco Use: High Risk (07/06/2023)    Readmission Risk Interventions    07/09/2023   12:49 PM  Readmission Risk Prevention Plan  Transportation Screening Complete  PCP or Specialist Appt within 5-7 Days Complete  Home Care Screening Complete  Medication Review (RN CM) Complete

## 2023-07-16 NOTE — Assessment & Plan Note (Addendum)
 Patient initially presented with substantial anemia requiring 1 unit packed red blood cell transfusion Patient is additionally exhibited multiple bouts of hematochezia since operative intervention thought to be exacerbated by anticoagulation No evidence of bleeding while on heparin , now transitioned to Xarelto 

## 2023-07-16 NOTE — Assessment & Plan Note (Addendum)
 Seen by palliative care earlier in the hospitalization Patient wishes to remain full code Patient agreeable to referral to outpatient palliative medicine at [facility] at time of discharge

## 2023-07-16 NOTE — Assessment & Plan Note (Signed)
 As evidenced by biopsies performed on 1/26 and 1/28. Patient is status post right hemicolectomy on 1/28, surgery following and managing postop wound care, their assistance is appreciated. Oncology consultation performed with Dr. Loretha.  On 2/19 at 3:15AM Patient unsure as to whether she wishes to proceed with recommended outpatient oncology evaluation and treatment, instead wants a second opinion  Port placement recommended during this hospitalization however patient is declining for now, pending her second opinion.

## 2023-07-16 NOTE — Assessment & Plan Note (Addendum)
 Improving leukocytosis with wound VAC in place  Ceftriaxone  and doxycycline  administered today, plan to transition to Omnicef and doxycycline  tomorrow prior to anticipated discharge.

## 2023-07-17 ENCOUNTER — Other Ambulatory Visit (HOSPITAL_COMMUNITY): Payer: Self-pay

## 2023-07-17 DIAGNOSIS — I10 Essential (primary) hypertension: Secondary | ICD-10-CM | POA: Diagnosis not present

## 2023-07-17 DIAGNOSIS — K6389 Other specified diseases of intestine: Secondary | ICD-10-CM | POA: Diagnosis not present

## 2023-07-17 DIAGNOSIS — Z7189 Other specified counseling: Secondary | ICD-10-CM | POA: Diagnosis not present

## 2023-07-17 DIAGNOSIS — T8149XA Infection following a procedure, other surgical site, initial encounter: Secondary | ICD-10-CM

## 2023-07-17 DIAGNOSIS — C189 Malignant neoplasm of colon, unspecified: Secondary | ICD-10-CM | POA: Diagnosis not present

## 2023-07-17 LAB — TYPE AND SCREEN
ABO/RH(D): A POS
Antibody Screen: NEGATIVE

## 2023-07-17 LAB — RENAL FUNCTION PANEL
Albumin: 2.1 g/dL — ABNORMAL LOW (ref 3.5–5.0)
Anion gap: 6 (ref 5–15)
BUN: 20 mg/dL (ref 8–23)
CO2: 23 mmol/L (ref 22–32)
Calcium: 8.3 mg/dL — ABNORMAL LOW (ref 8.9–10.3)
Chloride: 106 mmol/L (ref 98–111)
Creatinine, Ser: 0.6 mg/dL (ref 0.44–1.00)
GFR, Estimated: >60 mL/min (ref >60)
Glucose, Bld: 94 mg/dL (ref 70–99)
Phosphorus: 2.6 mg/dL (ref 2.5–4.6)
Potassium: 3.9 mmol/L (ref 3.5–5.1)
Sodium: 135 mmol/L (ref 135–145)

## 2023-07-17 LAB — CBC WITH DIFFERENTIAL/PLATELET
Abs Immature Granulocytes: 0.47 10*3/uL — ABNORMAL HIGH (ref 0.00–0.07)
Basophils Absolute: 0 10*3/uL (ref 0.0–0.1)
Basophils Relative: 0 %
Eosinophils Absolute: 0.1 10*3/uL (ref 0.0–0.5)
Eosinophils Relative: 1 %
HCT: 24.8 % — ABNORMAL LOW (ref 36.0–46.0)
Hemoglobin: 7.5 g/dL — ABNORMAL LOW (ref 12.0–15.0)
Immature Granulocytes: 4 %
Lymphocytes Relative: 10 %
Lymphs Abs: 1.1 10*3/uL (ref 0.7–4.0)
MCH: 24.2 pg — ABNORMAL LOW (ref 26.0–34.0)
MCHC: 30.2 g/dL (ref 30.0–36.0)
MCV: 80 fL (ref 80.0–100.0)
Monocytes Absolute: 0.7 10*3/uL (ref 0.1–1.0)
Monocytes Relative: 6 %
Neutro Abs: 8.6 10*3/uL — ABNORMAL HIGH (ref 1.7–7.7)
Neutrophils Relative %: 79 %
Platelets: 807 10*3/uL — ABNORMAL HIGH (ref 150–400)
RBC: 3.1 MIL/uL — ABNORMAL LOW (ref 3.87–5.11)
RDW: 19.6 % — ABNORMAL HIGH (ref 11.5–15.5)
WBC: 11 10*3/uL — ABNORMAL HIGH (ref 4.0–10.5)
nRBC: 0 % (ref 0.0–0.2)

## 2023-07-17 LAB — MAGNESIUM: Magnesium: 1.8 mg/dL (ref 1.7–2.4)

## 2023-07-17 MED ORDER — AMLODIPINE BESYLATE 5 MG PO TABS
5.0000 mg | ORAL_TABLET | Freq: Every day | ORAL | 2 refills | Status: DC
  Filled 2023-07-17: qty 30, 30d supply, fill #0
  Filled 2023-08-11: qty 30, 30d supply, fill #1
  Filled 2023-09-10: qty 30, 30d supply, fill #2

## 2023-07-17 MED ORDER — METHOCARBAMOL 500 MG PO TABS
500.0000 mg | ORAL_TABLET | Freq: Four times a day (QID) | ORAL | 0 refills | Status: AC | PRN
  Filled 2023-07-17 – 2023-08-11 (×2): qty 20, 5d supply, fill #0

## 2023-07-17 MED ORDER — FERROUS SULFATE 325 (65 FE) MG PO TABS
325.0000 mg | ORAL_TABLET | Freq: Every day | ORAL | 2 refills | Status: DC
  Filled 2023-07-17: qty 30, 30d supply, fill #0
  Filled 2023-08-11: qty 30, 30d supply, fill #1
  Filled 2023-09-10: qty 30, 30d supply, fill #2

## 2023-07-17 MED ORDER — FOLIC ACID 1 MG PO TABS
1.0000 mg | ORAL_TABLET | Freq: Every day | ORAL | 2 refills | Status: DC
  Filled 2023-07-17: qty 30, 30d supply, fill #0
  Filled 2023-08-11: qty 30, 30d supply, fill #1
  Filled 2023-09-10: qty 30, 30d supply, fill #2

## 2023-07-17 MED ORDER — FOLIC ACID 1 MG PO TABS
1.0000 mg | ORAL_TABLET | Freq: Every day | ORAL | Status: DC
  Administered 2023-07-17: 1 mg via ORAL
  Filled 2023-07-17: qty 1

## 2023-07-17 MED ORDER — CALCIUM POLYCARBOPHIL 625 MG PO TABS
625.0000 mg | ORAL_TABLET | Freq: Two times a day (BID) | ORAL | 2 refills | Status: DC
  Filled 2023-07-17: qty 60, 30d supply, fill #0

## 2023-07-17 MED ORDER — AMOXICILLIN-POT CLAVULANATE 875-125 MG PO TABS
1.0000 | ORAL_TABLET | Freq: Two times a day (BID) | ORAL | 0 refills | Status: AC
  Filled 2023-07-17: qty 14, 7d supply, fill #0

## 2023-07-17 MED ORDER — DOXYCYCLINE HYCLATE 100 MG PO TABS
100.0000 mg | ORAL_TABLET | Freq: Two times a day (BID) | ORAL | 0 refills | Status: AC
  Filled 2023-07-17: qty 14, 7d supply, fill #0

## 2023-07-17 MED ORDER — FERROUS SULFATE 325 (65 FE) MG PO TABS: 325.0000 mg | ORAL_TABLET | Freq: Every day | ORAL | Status: DC

## 2023-07-17 MED ORDER — SIMETHICONE 40 MG/0.6ML PO SUSP
80.0000 mg | Freq: Four times a day (QID) | ORAL | 1 refills | Status: DC | PRN
  Filled 2023-07-17: qty 30, 7d supply, fill #0

## 2023-07-17 MED ORDER — OLMESARTAN MEDOXOMIL 40 MG PO TABS
40.0000 mg | ORAL_TABLET | Freq: Every day | ORAL | 2 refills | Status: DC
  Filled 2023-07-17: qty 30, 30d supply, fill #0
  Filled 2023-08-11: qty 30, 30d supply, fill #1
  Filled 2023-09-10: qty 30, 30d supply, fill #2

## 2023-07-17 MED ORDER — ONDANSETRON HCL 4 MG PO TABS
4.0000 mg | ORAL_TABLET | Freq: Four times a day (QID) | ORAL | 0 refills | Status: AC | PRN
  Filled 2023-07-17: qty 20, 5d supply, fill #0

## 2023-07-17 MED ORDER — SODIUM CHLORIDE 0.9 % IV SOLN
100.0000 mg | Freq: Once | INTRAVENOUS | Status: AC
  Administered 2023-07-17: 100 mg via INTRAVENOUS
  Filled 2023-07-17: qty 5

## 2023-07-17 MED ORDER — PANTOPRAZOLE SODIUM 40 MG PO TBEC
40.0000 mg | DELAYED_RELEASE_TABLET | Freq: Every day | ORAL | 2 refills | Status: DC
  Filled 2023-07-17: qty 30, 30d supply, fill #0
  Filled 2023-08-11: qty 30, 30d supply, fill #1
  Filled 2023-09-10: qty 30, 30d supply, fill #2

## 2023-07-17 MED ORDER — OXYCODONE HCL 5 MG PO TABS
5.0000 mg | ORAL_TABLET | ORAL | 0 refills | Status: AC | PRN
  Filled 2023-07-17: qty 30, 5d supply, fill #0

## 2023-07-17 NOTE — TOC Transition Note (Signed)
 Transition of Care [facility]) - Discharge Note   Patient Details  Name: Tina Mccullough MRN: [id number] Date of Birth: [birth year]  Transition of Care [facility] CM/SW Contact:  Sonda Manuella Quill, RN Phone Number: 07/17/2023, 3:05 PM   Clinical Narrative:    D/C orders received; LVM for Randine Pope at [facility] for notification of pt d/c; called [employer] [phone]) spoke w/ Rep Ples, she says device serial # and placement date needed before receipt of delivery can be sent; called portable equipment and device will be delivered to room.  -1509- device received, serial # [id number]; placed 07/17/23 by ; Erla, Rep at [facility] given device information and placement date  -1541- device placed by Ciera, RN; pt signed proof of delivery; copy document given to pt; spoke w/ Randine Pope at [facility]; she confirms document has been received; LVM for [facility] [place] that pt d/c; no TOC needs. Final next level of care: Home w Home Health Services Barriers to Discharge: No Barriers Identified   Patient Goals and CMS Choice            Discharge Placement                       Discharge Plan and Services Additional resources added to the After Visit Summary for                                       Social Drivers of Health (SDOH) Interventions SDOH Screenings   Food Insecurity: No Food Insecurity (07/02/2023)  Housing: Low Risk  (07/02/2023)  Transportation Needs: No Transportation Needs (07/02/2023)  Utilities: Not At Risk (07/02/2023)  Social Connections: Moderately Integrated (07/03/2023)  Recent Concern: Social Connections - Socially Isolated (07/03/2023)  Tobacco Use: High Risk (07/06/2023)     Readmission Risk Interventions    07/09/2023   12:49 PM  Readmission Risk Prevention Plan  Transportation Screening Complete  PCP or Specialist Appt within 5-7 Days Complete  Home Care Screening Complete  Medication Review (RN CM) Complete

## 2023-07-17 NOTE — Progress Notes (Signed)
 Progress Note  11 Days Post-Op  Subjective: CC Tolerating diet and appetite improving. Having flatus and BM. No n/v. Explains general displeasure regarding care she has received here.  Objective: Vital signs in last 24 hours: Temp:  [98 F (36.7 C)-99 F (37.2 C)] 98.6 F (37 C) (02/08 0620) Pulse Rate:  [74-84] 74 (02/08 0620) Resp:  [16-18] 18 (02/08 0620) BP: (164-178)/(79-82) 164/79 (02/08 0620) SpO2:  [96 %-97 %] 97 % (02/08 0620) Last BM Date : 07/16/23  Intake/Output from previous day: 02/07 0701 - 02/08 0700 In: 1140 [P.O.:1040; IV Piggyback:100] Out: 0  Intake/Output this shift: No intake/output data recorded.  PE: General: pleasant, WD, female who is laying in bed in NAD Lungs: Normal rate and effort Abd: Soft, mild distension. Appropriately ttp around her incision - otherwise NT. No rebound/guarding. Midline wound with wound vac in place with good suction - cloudy yellow discharge in canister   Lab Results:  Recent Labs    07/16/23 0451 07/17/23 0543  WBC 14.3* 11.0*  HGB 7.4* 7.5*  HCT 24.2* 24.8*  PLT 768* 807*   BMET Recent Labs    07/16/23 0451 07/17/23 0543  NA 136 135  K 3.3* 3.9  CL 104 106  CO2 25 23  GLUCOSE 88 94  BUN 22 20  CREATININE 0.67 0.60  CALCIUM  8.0* 8.3*   PT/INR No results for input(s): LABPROT, INR in the last 72 hours. CMP     Component Value Date/Time   NA 135 07/17/2023 0543   K 3.9 07/17/2023 0543   CL 106 07/17/2023 0543   CO2 23 07/17/2023 0543   GLUCOSE 94 07/17/2023 0543   BUN 20 07/17/2023 0543   CREATININE 0.60 07/17/2023 0543   CALCIUM  8.3 (L) 07/17/2023 0543   PROT 4.8 (L) 07/16/2023 0451   ALBUMIN  2.1 (L) 07/17/2023 0543   AST 12 (L) 07/16/2023 0451   ALT 15 07/16/2023 0451   ALKPHOS 64 07/16/2023 0451   BILITOT 0.4 07/16/2023 0451   GFRNONAA >60 07/17/2023 0543   GFRAA >60 06/11/2018 1653   Lipase     Component Value Date/Time   LIPASE <10 (L) 07/01/2023 1652        Studies/Results: DG Chest 1 View Result Date: 07/15/2023 CLINICAL DATA:  Pneumonia EXAM: CHEST  1 VIEW COMPARISON:  CT chest, 07/02/2023, chest radiographs, 07/02/2023 FINDINGS: The heart size and mediastinal contours are within normal limits. Elevation of the right hemidiaphragm atelectasis or consolidation of the right lung base likely associated with a small pleural effusion. Left lung normally aerated. The visualized skeletal structures are unremarkable. IMPRESSION: Elevation of the right hemidiaphragm with atelectasis or consolidation of the right lung base likely associated with a small pleural effusion. Radiographic findings similar to recent CT. Electronically Signed   By: Marolyn JONETTA Jaksch M.D.   On: 07/15/2023 14:18    Anti-infectives: Anti-infectives (From admission, onward)    Start     Dose/Rate Route Frequency Ordered Stop   07/16/23 2200  amoxicillin -clavulanate (AUGMENTIN ) 875-125 MG per tablet 1 tablet        1 tablet Oral Every 12 hours 07/16/23 0852     07/16/23 1000  doxycycline  (VIBRA -TABS) tablet 100 mg        100 mg Oral Every 12 hours 07/16/23 0851 07/23/23 0959   07/16/23 0945  cefTRIAXone  (ROCEPHIN ) 2 g in sodium chloride  0.9 % 100 mL IVPB        2 g 200 mL/hr over 30 Minutes Intravenous  Once 07/16/23 0853  07/16/23 0953   07/16/23 0900  cefTRIAXone  (ROCEPHIN ) 2 g in sodium chloride  0.9 % 100 mL IVPB  Status:  Discontinued        2 g 200 mL/hr over 30 Minutes Intravenous Every 24 hours 07/15/23 2103 07/16/23 0853   07/15/23 2000  cefTRIAXone  (ROCEPHIN ) 2 g in sodium chloride  0.9 % 100 mL IVPB  Status:  Discontinued        2 g 200 mL/hr over 30 Minutes Intravenous Every 24 hours 07/15/23 1912 07/15/23 1912   07/06/23 0945  ceFAZolin  (ANCEF ) IVPB 2g/100 mL premix        2 g 200 mL/hr over 30 Minutes Intravenous On call to O.R. 07/06/23 0853 07/06/23 1221        Assessment/Plan POD11 s/p open R hemicolectomy 1/28 Dr. 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    - S/p c scope 1/26 Dr. Brambhatt - Biopsy with adenocarcinoma - CEA elevated to 71.3, CT CH without met disease - Surgical path c/w [id number] adenocarcinoma - Dr Signe d/w patient. She has oncology follow up  - CT 2/4 with post op ileus pattern. No abscess. New small b/l pleural effusions - staples removed from midline with large amount of purulent drainage 2/5.  - wound vac 2/6 with next change Monday. M/th schedule. HH orders in. Drainage cloudy yellow today - Hematochezia resolved. Reports dark/tarry stool but is on iron . Hep gtt resumed 2/5 . Transitioned to doac - WBC down to 11, afebrile. Hgb overall stable at 7.5. On PO abx - Encouraged mobilization and IS. Encouraged trying to utilize only PO pain meds today  Ok for discharge home from our perspective if she has home health/wound VAC in place. Wound continue course of Augmentin  for 5 additional days.   FEN: soft, IVF per primary, replete K and mag ID: Ancef  periop VTE: Hep gtt   Per primary HTN COPD tobacco use LUE dvt - on home Xarelto  RUE SVT   LOS: 16 days   Lonni CHRISTELLA Pizza, MD [facility] Surgery 07/17/2023, 9:42 AM Please see Amion for pager number during day hours 7:00am-4:30pm

## 2023-07-17 NOTE — Plan of Care (Signed)
  Problem: Education: Goal: Knowledge of General Education information will improve Description: Including pain rating scale, medication(s)/side effects and non-pharmacologic comfort measures Outcome: Progressing   Problem: Health Behavior/Discharge Planning: Goal: Ability to manage health-related needs will improve Outcome: Progressing   Problem: Clinical Measurements: Goal: Ability to maintain clinical measurements within normal limits will improve Outcome: Progressing Goal: Will remain free from infection Outcome: Progressing Goal: Diagnostic test results will improve Outcome: Progressing Goal: Respiratory complications will improve Outcome: Progressing Goal: Cardiovascular complication will be avoided Outcome: Progressing   Problem: Nutrition: Goal: Adequate nutrition will be maintained Outcome: Progressing   Problem: Coping: Goal: Level of anxiety will decrease Outcome: Progressing   Problem: Pain Managment: Goal: General experience of comfort will improve and/or be controlled Outcome: Progressing   Problem: Safety: Goal: Ability to remain free from injury will improve Outcome: Progressing   Problem: Skin Integrity: Goal: Risk for impaired skin integrity will decrease Outcome: Progressing   Problem: Clinical Measurements: Goal: Ability to avoid or minimize complications of infection will improve Outcome: Progressing   Problem: Skin Integrity: Goal: Skin integrity will improve Outcome: Progressing

## 2023-07-17 NOTE — Progress Notes (Signed)
 AVS reviewed w/ pt and both sons in room.TOC discharge meds in a secure bag delivered to pt in room. Pt remains in gown. Pt has 4-5 tablets of Xarelto  left at home- son is to call Cancer center nurse navigator on Monday if pt does not have refills. Pt asked about PT  referral- no notes in chart at this time from  Dr Kenard. Primary nurse updated on patient's questions.

## 2023-07-17 NOTE — TOC Progression Note (Signed)
 Transition of Care [facility]) - Progression Note    Patient Details  Name: Tina Mccullough MRN: [id number] Date of Birth: [birth year]  Transition of Care [facility]) CM/SW Contact  Sonda Manuella Quill, RN Phone Number: 07/17/2023, 12:40 PM  Clinical Narrative:    Pt ready for d/c per surgery; spoke w/ Randine Pope at [facility]; she says e-script was received, and pt has been approved; she also says vac dressing will need to be switched out when pt is ready for d/c; sehe will also email this RN, CM document of receipt for pt to sign; Randine says she will contact portable equipment to bring new wound vac to pt's room, and the bedside RN will switch wound vac out; Dr Teresa notified; TOC is following.         Expected Discharge Plan and Services                                               Social Determinants of Health (SDOH) Interventions SDOH Screenings   Food Insecurity: No Food Insecurity (07/02/2023)  Housing: Low Risk  (07/02/2023)  Transportation Needs: No Transportation Needs (07/02/2023)  Utilities: Not At Risk (07/02/2023)  Social Connections: Moderately Integrated (07/03/2023)  Recent Concern: Social Connections - Socially Isolated (07/03/2023)  Tobacco Use: High Risk (07/06/2023)    Readmission Risk Interventions    07/09/2023   12:49 PM  Readmission Risk Prevention Plan  Transportation Screening Complete  PCP or Specialist Appt within 5-7 Days Complete  Home Care Screening Complete  Medication Review (RN CM) Complete

## 2023-07-17 NOTE — Progress Notes (Signed)
 Assessment unchanged. Pt and sons verbalized understanding of dc instructions including medications to resume and follow up care. Wound vac teaching done as applicable. HHRN to follow at home. Discharged via wc to front entrance accompanied by son and NT.

## 2023-07-17 NOTE — Progress Notes (Signed)
 Pt switched over to home wound vac, canister changed supplies and return box left with pt. Waiting on family to complete AVS teaching.

## 2023-07-17 NOTE — Plan of Care (Signed)
  Problem: Nutrition: Goal: Adequate nutrition will be maintained Outcome: Adequate for Discharge   Problem: Coping: Goal: Level of anxiety will decrease Outcome: Adequate for Discharge   Problem: Pain Managment: Goal: General experience of comfort will improve and/or be controlled Outcome: Progressing

## 2023-07-17 NOTE — Progress Notes (Signed)
 PMT no charge note.   Chart reviewed, surgery note reviewed. Note plans for possible discharge soon.  Patient has appointment with Palliative Care at [facility] with my colleague Ms Cousar NP on 07-28-23 at 2 PM.  Continue current PO PRN opioids, otherwise no new inpatient PMT specific recommendations at this time No charge Lonia Serve MD [facility] health palliative.

## 2023-07-17 NOTE — Discharge Summary (Signed)
 Physician Discharge Summary   Patient: Tina Mccullough MRN: [id number] DOB: 1950/07/24  Admit date:     07/01/2023  Discharge date: 07/17/2023  Discharge Physician: Zachary JINNY Ba   PCP: Loreli Kins, MD   Recommendations at discharge:   Maintain wound VAC as instructed. Advance diet as tolerated Advance activity as tolerated Complete your entire regimen of oral antibiotics.  You have also been started on a regimen of folic acid  and iron  supplementation.  Please take as instructed. Please follow-up with your outpatient providers including your primary care provider, general surgery and oncology.  Discharge Diagnoses: Principal Problem:   Colon adenocarcinoma (HCC) Active Problems:   Acute deep vein thrombosis (DVT) of both upper extremities (HCC)   Essential hypertension   Nicotine  dependence, cigarettes, uncomplicated   COPD (chronic obstructive pulmonary disease) (HCC)   Hypokalemia   Palliative care encounter   Need for emotional support   Goals of care, counseling/discussion   Acute lower GI bleeding   Surgical site infection  Resolved Problems:   Colonic mass   Hospital Course: 73 year old female history of lymphocytic colitis, COPD DVT PE hypertension recent admission on 11/28 for fall and DVT presented to the [facility] with 4 days of purple stool then some blood, abdominal pain on right side and had outpatient CT scan done that showed a mass and potentially some colitis and sent to the ED.  Patient was found to have obstructing masslike lesion in hepatic flexure suspicious for colon carcinoma dilated ascending colon with mild wall thickening suspicious for colitis mild right colonic wall thickening.   Patient had colonoscopy 07/04/23 with poor prep showed large obstructing and infiltrative mass in the proximal transverse colon/hepatic flexure but able to advance the scope beyond this area biopsies taken.  Biopsy showed adenocarcinoma.  1/28 underwent right  hemicolectomy.  Pathology revealed invasive moderately differentiated adenocarcinoma extending through submucosa, muscularis propria and into subserosal adipose tissue with negative margins.   Dr. Loretha with oncology was consulted to establish care and appointment was made on 07/28/2023 for postdischarge follow-up.  Hospital course was complicated by multiple bouts of hematochezia.  Patient's anticoagulation for her thromboembolic disease had to be temporarily held.  Bleeding was felt to be coming from the anastomosis.  After several days anticoagulation was resumed without further evidence of bleeding.  Hospital course was also complicated by suspected surgical site infection for which patient was placed on a course of antibiotics including ceftriaxone  and doxycycline , which was later transition to Augmentin  and doxycycline  at time of discharge.  Patient was discharged home with home health services in improved and stable condition.     Pain control - [place]  Controlled Substance Reporting System database was reviewed. and patient was instructed, not to drive, operate heavy machinery, perform activities at heights, swimming or participation in water activities or provide baby-sitting services while on Pain, Sleep and Anxiety Medications; until their outpatient Physician has advised to do so again. Also recommended to not to take more than prescribed Pain, Sleep and Anxiety Medications.   Consultants: Dr. Loretha with Oncology, Dr. Vanderbilt with General Surgery, Dr. Elicia with Gastroenterology, Dr. Clayton with Palliative Care Procedures performed: Right hemicolectomy 1/28 Disposition: Home health Diet recommendation:  Carb modified diet  DISCHARGE MEDICATION: Allergies as of 07/17/2023       Reactions   Beef-derived Drug Products Hives   Alpha-gal   Dog Epithelium (canis Lupus Familiaris) Anaphylaxis   Pork-derived Products Hives   Alpha-gal   Bee Venom Hives   Diclofenac  Nausea  And Vomiting   Lisinopril Other (See Comments)   Headaches        Medication List     TAKE these medications    acetaminophen  500 MG tablet Commonly known as: TYLENOL  Take 2 tablets (1,000 mg total) by mouth every 6 (six) hours as needed.   amLODipine  5 MG tablet Commonly known as: NORVASC  Take 1 tablet (5 mg total) by mouth daily.   amoxicillin -clavulanate 875-125 MG tablet Commonly known as: AUGMENTIN  Take 1 tablet by mouth every 12 (twelve) hours for 7 days.   ascorbic acid  500 MG tablet Commonly known as: VITAMIN C  Take 1,500 mg by mouth in the morning.   diphenhydrAMINE 25 mg capsule Commonly known as: BENADRYL Take 25 mg by mouth daily as needed (ONLY FOR ALLERGIC SYMPTOMS).   doxycycline  100 MG tablet Commonly known as: VIBRA -TABS Take 1 tablet (100 mg total) by mouth every 12 (twelve) hours for 7 days.   EPINEPHrine  0.3 mg/0.3 mL Devi Commonly known as: EpiPen  Inject 0.3 mLs (0.3 mg total) into the muscle as needed. What changed: reasons to take this   FeroSul 325 (65 Fe) MG tablet Generic drug: ferrous sulfate  Take 1 tablet (325 mg total) by mouth daily before breakfast. Preferably take on an empty stomach with a small cup of orange juice   folic acid  1 MG tablet Commonly known as: FOLVITE  Take 1 tablet (1 mg total) by mouth daily.   methocarbamol  500 MG tablet Commonly known as: ROBAXIN  Take 1 tablet (500 mg total) by mouth every 6 (six) hours as needed for up to 5 days for muscle spasms (pain).   NICODERM CQ  TD Place 1 patch onto the skin daily as needed (for smoking cessation- while hospitalized).   olmesartan  40 MG tablet Commonly known as: BENICAR  Take 1 tablet (40 mg total) by mouth daily. What changed:  medication strength how much to take when to take this   ondansetron  4 MG tablet Commonly known as: ZOFRAN  Take 1 tablet (4 mg total) by mouth every 6 (six) hours as needed for up to 5 days for nausea.   oxyCODONE  5 MG immediate  release tablet Commonly known as: Oxy IR/ROXICODONE  Take 1 tablet (5 mg total) by mouth every 4 (four) hours as needed for up to 5 days for severe pain (pain score 7-10). What changed:  when to take this reasons to take this   pantoprazole  40 MG tablet Commonly known as: PROTONIX  Take 1 tablet (40 mg total) by mouth daily.   polycarbophil 625 MG tablet Commonly known as: FIBERCON Take 1 tablet (625 mg total) by mouth 2 (two) times daily.   Sambucus Elderberry Immune Chew Chew 2 tablets by mouth in the morning.   Simethicone  Drops Infants 20 MG/0.3ML drops Generic drug: simethicone  Take 1.2 mLs (80 mg total) by mouth 4 (four) times daily as needed for flatulence (PRN bloating, gassiness).   Vitamin D-3 125 MCG (5000 UT) Tabs Take 10,000 Units by mouth daily.   Xarelto  20 MG Tabs tablet Generic drug: rivaroxaban  Take 1 tablet (20 mg total) by mouth daily with food        Follow-up Information     Signe Mitzie LABOR, MD. Go on 07/29/2023.   Specialty: General Surgery Why: 2/20 at 11:30 am., Please arrive 30 minutes early to complete check in, and bring photo ID and insurance card. Contact information: [address] [place] [address] [phone]         Iruku, Praveena,  MD Follow up on 07/28/2023.   Specialty: Hematology and Oncology Why: 3:15PM Contact information: [address] [place] [address] [phone]         [facility] Follow up.   Contact information: [address] [place] [address] [phone]         Loreli Kins, MD Follow up in 1 week(s).   Specialty: Family Medicine Contact information: [address] [place] [address] [phone]                 Discharge Exam: Fredricka Weights   07/02/23 1100 07/06/23 1022  Weight: 65.4 kg 61.2 kg    Constitutional: Awake alert and oriented x3, no associated distress.   Respiratory: clear to auscultation  bilaterally, no wheezing, no crackles. Normal respiratory effort. No accessory muscle use.  Cardiovascular: Regular rate and rhythm, no murmurs / rubs / gallops. No extremity edema. 2+ pedal pulses. No carotid bruits.  Abdomen: Mild abdominal tenderness, abdomen is soft.  No evidence of intra-abdominal masses.  Positive bowel sounds noted in all quadrants.   Musculoskeletal: No joint deformity upper and lower extremities. Good ROM, no contractures. Normal muscle tone.     Condition at discharge: fair  The results of significant diagnostics from this hospitalization (including imaging, microbiology, ancillary and laboratory) are listed below for reference.   Imaging Studies: DG Chest 1 View Result Date: 07/15/2023 CLINICAL DATA:  Pneumonia EXAM: CHEST  1 VIEW COMPARISON:  CT chest, 07/02/2023, chest radiographs, 07/02/2023 FINDINGS: The heart size and mediastinal contours are within normal limits. Elevation of the right hemidiaphragm atelectasis or consolidation of the right lung base likely associated with a small pleural effusion. Left lung normally aerated. The visualized skeletal structures are unremarkable. IMPRESSION: Elevation of the right hemidiaphragm with atelectasis or consolidation of the right lung base likely associated with a small pleural effusion. Radiographic findings similar to recent CT. Electronically Signed   By: Marolyn JONETTA Jaksch M.D.   On: 07/15/2023 14:18   VAS US  UPPER EXTREMITY VENOUS DUPLEX Result Date: 07/14/2023 UPPER VENOUS STUDY  Patient Name:  ALAYIA MEGGISON  Date of Exam:   07/14/2023 Medical Rec #: [id number]               Accession #:    [id number] Date of Birth: 02-07-1951               Patient Gender: F Patient Age:   41 years Exam Location:  [facility] Procedure:      VAS US  UPPER EXTREMITY VENOUS DUPLEX Referring Phys: ZACHARY BA --------------------------------------------------------------------------------  Indications: Swelling Risk Factors:  Cancer DVT 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 Anticoagulation: Xarelto  prior to admission,, heparin  during admission that was stopped briefly for surgical procedure. Comparison Study: Previous exam on 07/05/23 (LUEV) positive for DVT in axillary                   and SVT in basilic first seen on 05/05/2023 Performing Technologist: Ezzie Potters RVT, RDMS  Examination Guidelines: A complete evaluation includes B-mode imaging, spectral Doppler, color Doppler, and power Doppler as needed of all accessible portions of each vessel. Bilateral testing is considered an integral part of a complete examination. Limited examinations for reoccurring indications may be performed as noted.  Right Findings: +----------+------------+---------+-----------+----------+-------+ RIGHT     CompressiblePhasicitySpontaneousPropertiesSummary +----------+------------+---------+-----------+----------+-------+ IJV           Full       Yes  Yes                      +----------+------------+---------+-----------+----------+-------+ Subclavian    Full       Yes       Yes                      +----------+------------+---------+-----------+----------+-------+ Axillary      Full       Yes       Yes                      +----------+------------+---------+-----------+----------+-------+ Brachial      Full       Yes       Yes                      +----------+------------+---------+-----------+----------+-------+ Radial        Full                                          +----------+------------+---------+-----------+----------+-------+ Ulnar         Full                                          +----------+------------+---------+-----------+----------+-------+ Cephalic      None       No        No                Acute  +----------+------------+---------+-----------+----------+-------+ Basilic     Partial      Yes       Yes               Acute   +----------+------------+---------+-----------+----------+-------+  Left Findings: +----------+------------+---------+-----------+----------+-------+ LEFT      CompressiblePhasicitySpontaneousPropertiesSummary +----------+------------+---------+-----------+----------+-------+ Subclavian    Full       Yes       Yes                      +----------+------------+---------+-----------+----------+-------+  Summary:  Right: No evidence of deep vein thrombosis in the upper extremity. Findings consistent with acute superficial vein thrombosis involving the right basilic vein and right cephalic vein.  Left: No evidence of thrombosis in the subclavian.  *See table(s) above for measurements and observations.  Diagnosing physician: Gaile New MD Electronically signed by Gaile New MD on 07/14/2023 at 5:54:03 PM.    Final    CT ABDOMEN PELVIS W CONTRAST Result Date: 07/13/2023 CLINICAL DATA:  Postop abdominal pain. Status post right colectomy for colon carcinoma. * Tracking Code: BO * EXAM: CT ABDOMEN AND PELVIS WITH CONTRAST TECHNIQUE: Multidetector CT imaging of the abdomen and pelvis was performed using the standard protocol following bolus administration of intravenous contrast. RADIATION DOSE REDUCTION: This exam was performed according to the departmental dose-optimization program which includes automated exposure control, adjustment of the mA and/or kV according to patient size and/or use of iterative reconstruction technique. CONTRAST:  OMNIPAQUE  IOHEXOL  300 MG/ML  SOLN COMPARISON:  07/01/2023 FINDINGS: Lower Chest: New small bilateral pleural effusions and bilateral lower lobe atelectasis, right side greater than left. Hepatobiliary: A few tiny sub-cm hepatic cysts remain stable. No suspicious hepatic masses identified. Gallbladder is unremarkable. No evidence of biliary ductal dilatation. Pancreas:  No mass or  inflammatory changes. Spleen: Within normal limits in size and appearance.  Adrenals/Urinary Tract: Stable bilateral adrenal hyperplasia. No suspicious masses identified. No evidence of ureteral calculi or hydronephrosis. Small amount of gas in urinary bladder is presumably due to recent catheterization. Stomach/Bowel: Postop changes are seen from right hemicolectomy. No mass identified. Mildly dilated small bowel loops with air-fluid levels are seen, consistent with mild ileus. Colonic diverticulosis is again noted, without signs of diverticulitis. Mild ascites is seen, however, no abscess identified. Vascular/Lymphatic: No pathologically enlarged lymph nodes. No acute vascular findings. Reproductive: Tiny less than 1 cm calcified uterine fibroid again noted. Vaginal pessary remains in place. Other:  Moderate diffuse body wall edema. Musculoskeletal:  No suspicious bone lesions identified. IMPRESSION: Postop changes from right hemicolectomy. No evidence of recurrent or metastatic disease. Mild postop ileus pattern. Mild ascites. No evidence of abscess. Moderate diffuse body wall edema. New small bilateral pleural effusions and bilateral lower lobe atelectasis, right side greater than left. Electronically Signed   By: Norleen DELENA Kil M.D.   On: 07/13/2023 16:49   VAS US  UPPER EXTREMITY VENOUS DUPLEX Result Date: 07/05/2023 UPPER VENOUS STUDY  Patient Name:  LERAE LANGHAM  Date of Exam:   07/05/2023 Medical Rec #: [id number]               Accession #:    [id number] Date of Birth: 1950-09-08               Patient Gender: F Patient Age:   60 years Exam Location:  [facility] Procedure:      VAS US  UPPER EXTREMITY VENOUS DUPLEX Referring Phys: [facility] --------------------------------------------------------------------------------  Indications: Swelling Anticoagulation: Xarelto  (prior to admission). Comparison Study: Previous exam on 05/05/2023 was positive for DVT in LUE                   (subclavian & axillary) & SVT (basilic) Performing Technologist: Ezzie Potters RVT, RDMS   Examination Guidelines: A complete evaluation includes B-mode imaging, spectral Doppler, color Doppler, and power Doppler as needed of all accessible portions of each vessel. Bilateral testing is considered an integral part of a complete examination. Limited examinations for reoccurring indications may be performed as noted.  Left Findings: +----------+------------+---------+-----------+----------+-----------------+ LEFT      CompressiblePhasicitySpontaneousProperties     Summary      +----------+------------+---------+-----------+----------+-----------------+ IJV           Full       Yes       Yes                                +----------+------------+---------+-----------+----------+-----------------+ Subclavian    Full       Yes       Yes                                +----------+------------+---------+-----------+----------+-----------------+ Axillary    Partial      Yes       Yes              Age Indeterminate +----------+------------+---------+-----------+----------+-----------------+ Brachial      Full       Yes       Yes                                +----------+------------+---------+-----------+----------+-----------------+ Radial  Full                                                    +----------+------------+---------+-----------+----------+-----------------+ Ulnar         Full                                                    +----------+------------+---------+-----------+----------+-----------------+ Cephalic      Full                                                    +----------+------------+---------+-----------+----------+-----------------+ Basilic       None       No        No               Age Indeterminate +----------+------------+---------+-----------+----------+-----------------+  Summary:  Right: No evidence of thrombosis in the subclavian.  Left: Findings consistent with age indeterminate deep vein thrombosis  involving the left distal axillary vein. Findings consistent with age indeterminate superficial vein thrombosis involving the left forearm portion of cephalic vein and left upper arm postion of basilic vein.  *See table(s) above for measurements and observations.  Diagnosing physician: Fonda Rim Electronically signed by Fonda Rim on 07/05/2023 at 5:18:03 PM.    Final    CT CHEST W CONTRAST Result Date: 07/02/2023 CLINICAL DATA:  Colon cancer staging. EXAM: CT CHEST WITH CONTRAST TECHNIQUE: Multidetector CT imaging of the chest was performed during intravenous contrast administration. RADIATION DOSE REDUCTION: This exam was performed according to the departmental dose-optimization program which includes automated exposure control, adjustment of the mA and/or kV according to patient size and/or use of iterative reconstruction technique. CONTRAST:  75mL OMNIPAQUE  IOHEXOL  300 MG/ML  SOLN COMPARISON:  CT abdomen and pelvis 06/30/2022 FINDINGS: Cardiovascular: No significant vascular findings. Normal heart size. No pericardial effusion. Mediastinum/Nodes: Hypodense right thyroid  nodule measures 11 mm. There are no enlarged mediastinal or hilar lymph nodes identified. Visualized esophagus is within normal limits. Lungs/Pleura: There is patchy airspace consolidation in the bilateral lower lobes, right greater than left. Mild emphysematous changes are present. There is no pleural effusion or pneumothorax. Upper Abdomen: There are 2 rounded hypodensities in the liver favored as cysts, unchanged. Nodular thickening of the adrenal glands again noted. Musculoskeletal: There are healed posterior left rib fractures. No acute fractures are seen. No focal osseous lesions are identified. IMPRESSION: 1. No evidence for metastatic disease in the chest. 2. Patchy airspace consolidation in the bilateral lower lobes, right greater than left, worrisome for pneumonia. 3. Emphysema. Emphysema (ICD10-J43.9). Electronically Signed    By: Greig Pique M.D.   On: 07/02/2023 19:19   CT ABDOMEN PELVIS W CONTRAST Result Date: 07/01/2023 CLINICAL DATA:  Right lower quadrant pain for 3 days. Blood in stool. Constipation. * Tracking Code: BO * EXAM: CT ABDOMEN AND PELVIS WITH CONTRAST TECHNIQUE: Multidetector CT imaging of the abdomen and pelvis was performed using the standard protocol following bolus administration of intravenous contrast. RADIATION DOSE REDUCTION: This exam was performed according to the departmental dose-optimization program which includes automated exposure  control, adjustment of the mA and/or kV according to patient size and/or use of iterative reconstruction technique. CONTRAST:  100mL ISOVUE -300 IOPAMIDOL  (ISOVUE -300) INJECTION 61% COMPARISON:  06/24/2018 FINDINGS: Lower Chest: No acute findings. Hepatobiliary: No suspicious hepatic masses identified. A few tiny hepatic cysts are noted. Gallbladder is unremarkable. No evidence of biliary ductal dilatation. Pancreas:  No mass or inflammatory changes. Spleen: Within normal limits in size and appearance. Adrenals/Urinary Tract: Stable diffuse nodular thickening of both adrenal glands, consistent with nodular hyperplasia. No suspicious renal masses identified. No evidence of ureteral calculi or hydronephrosis. Stomach/Bowel: The right colon is dilated and contains a large amount of stool. A masslike constricting lesion is seen in the hepatic flexure of the colon measuring approximately 4 cm in length, highly suspicious for obstructing colon carcinoma. Adjacent sub-centimeter pericolonic lymph nodes at this site are seen, suspicious for local lymph node metastases. Mild right colonic wall thickening and adjacent soft tissue stranding also seen, consistent with colitis. No evidence of pneumatosis, free intraperitoneal air, or abscess. Normal appendix visualized. Vascular/Lymphatic: No pathologically enlarged lymph nodes. No acute vascular findings. Reproductive: No mass or other  significant abnormality. Pessary noted in vagina. Other:  None. Musculoskeletal:  No suspicious bone lesions identified. IMPRESSION: Obstructing masslike lesion in hepatic flexure of colon, highly suspicious for colon carcinoma. Dilated ascending colon shows mild wall thickening and adjacent stranding, highly suspicious for colitis. No evidence of perforation or abscess. Adjacent sub-centimeter pericolonic lymph nodes at this site. Local lymph node metastases cannot be excluded. Mild right colonic wall thickening and adjacent soft tissue stranding, consistent with colitis. No evidence of distant metastatic disease. These results will be called to the ordering clinician or representative by the Radiologist Assistant, and communication documented in the PACS or [employer]. Electronically Signed   By: Norleen DELENA Kil M.D.   On: 07/01/2023 15:18    Microbiology: Results for orders placed or performed during the hospital encounter of 05/05/23  Resp panel by RT-PCR (RSV, Flu A&B, Covid) Anterior Nasal Swab     Status: None   Collection Time: 05/05/23 11:40 AM   Specimen: Anterior Nasal Swab  Result Value Ref Range Status   SARS Coronavirus 2 by RT PCR NEGATIVE NEGATIVE Final    Comment: (NOTE) SARS-CoV-2 target nucleic acids are NOT DETECTED.  The SARS-CoV-2 RNA is generally detectable in upper respiratory specimens during the acute phase of infection. The lowest concentration of SARS-CoV-2 viral copies this assay can detect is 138 copies/mL. A negative result does not preclude SARS-Cov-2 infection and should not be used as the sole basis for treatment or other patient management decisions. A negative result may occur with  improper specimen collection/handling, submission of specimen other than nasopharyngeal swab, presence of viral mutation(s) within the areas targeted by this assay, and inadequate number of viral copies(<138 copies/mL). A negative result must be combined with clinical  observations, patient history, and epidemiological information. The expected result is Negative.  Fact Sheet for Patients:  bloggercourse.com  Fact Sheet for Healthcare Providers:  seriousbroker.it  This test is no t yet approved or cleared by the [place]  FDA and  has been authorized for detection and/or diagnosis of SARS-CoV-2 by FDA under an Emergency Use Authorization (EUA). This EUA will remain  in effect (meaning this test can be used) for the duration of the COVID-19 declaration under Section 564(b)(1) of the Act, 21 U.S.C.section 360bbb-3(b)(1), unless the authorization is terminated  or revoked sooner.       Influenza A by PCR NEGATIVE  NEGATIVE Final   Influenza B by PCR NEGATIVE NEGATIVE Final    Comment: (NOTE) The Xpert Xpress SARS-CoV-2/FLU/RSV plus assay is intended as an aid in the diagnosis of influenza from Nasopharyngeal swab specimens and should not be used as a sole basis for treatment. Nasal washings and aspirates are unacceptable for Xpert Xpress SARS-CoV-2/FLU/RSV testing.  Fact Sheet for Patients: bloggercourse.com  Fact Sheet for Healthcare Providers: seriousbroker.it  This test is not yet approved or cleared by the [place]  FDA and has been authorized for detection and/or diagnosis of SARS-CoV-2 by FDA under an Emergency Use Authorization (EUA). This EUA will remain in effect (meaning this test can be used) for the duration of the COVID-19 declaration under Section 564(b)(1) of the Act, 21 U.S.C. section 360bbb-3(b)(1), unless the authorization is terminated or revoked.     Resp Syncytial Virus by PCR NEGATIVE NEGATIVE Final    Comment: (NOTE) Fact Sheet for Patients: bloggercourse.com  Fact Sheet for Healthcare Providers: seriousbroker.it  This test is not yet approved or cleared by  the [place]  FDA and has been authorized for detection and/or diagnosis of SARS-CoV-2 by FDA under an Emergency Use Authorization (EUA). This EUA will remain in effect (meaning this test can be used) for the duration of the COVID-19 declaration under Section 564(b)(1) of the Act, 21 U.S.C. section 360bbb-3(b)(1), unless the authorization is terminated or revoked.  Performed at [facility], [address], [place], [place] [address]     Labs: CBC: Recent Labs  Lab 07/13/23 0503 07/14/23 0458 07/15/23 0910 07/16/23 0451 07/17/23 0543  WBC 14.8* 31.3* 18.8* 14.3* 11.0*  NEUTROABS  --   --  16.0*  --  8.6*  HGB 7.5* 8.2* 7.9* 7.4* 7.5*  HCT 24.7* 26.8* 26.2* 24.2* 24.8*  MCV 81.3 81.2 81.4 80.1 80.0  PLT 615* 662* 928* 768* 807*   Basic Metabolic Panel: Recent Labs  Lab 07/11/23 0504 07/13/23 0503 07/15/23 0910 07/16/23 0451 07/17/23 0543  NA 136 136 135 136 135  K 3.9 3.8 4.1 3.3* 3.9  CL 104 106 102 104 106  CO2 24 23 25 25 23   GLUCOSE 108* 88 92 88 94  BUN 28* 26* 23 22 20   CREATININE 0.67 0.76 0.75 0.67 0.60  CALCIUM  8.6* 8.6* 8.7* 8.0* 8.3*  MG  --   --  1.9 1.8 1.8  PHOS  --   --   --  2.8 2.6   Liver Function Tests: Recent Labs  Lab 07/15/23 0910 07/16/23 0451 07/17/23 0543  AST 21 12*  --   ALT 19 15  --   ALKPHOS 82 64  --   BILITOT 0.6 0.4  --   PROT 5.5* 4.8*  --   ALBUMIN  2.4* 1.9* 2.1*   CBG: No results for input(s): GLUCAP in the last 168 hours.  Discharge time spent: greater than 30 minutes.  Signed: Zachary JINNY Ba, MD [facility] 07/18/2023

## 2023-07-19 ENCOUNTER — Other Ambulatory Visit (HOSPITAL_COMMUNITY): Payer: Self-pay

## 2023-07-19 MED ORDER — METHOCARBAMOL 500 MG PO TABS
500.0000 mg | ORAL_TABLET | Freq: Four times a day (QID) | ORAL | 1 refills | Status: DC | PRN
Start: 2023-07-19 — End: 2023-09-16
  Filled 2023-07-19: qty 30, 8d supply, fill #0

## 2023-07-23 DIAGNOSIS — Z86711 Personal history of pulmonary embolism: Secondary | ICD-10-CM | POA: Diagnosis not present

## 2023-07-23 DIAGNOSIS — T8149XA Infection following a procedure, other surgical site, initial encounter: Secondary | ICD-10-CM | POA: Diagnosis not present

## 2023-07-23 DIAGNOSIS — E876 Hypokalemia: Secondary | ICD-10-CM | POA: Diagnosis not present

## 2023-07-23 DIAGNOSIS — Z7901 Long term (current) use of anticoagulants: Secondary | ICD-10-CM | POA: Diagnosis not present

## 2023-07-23 DIAGNOSIS — I10 Essential (primary) hypertension: Secondary | ICD-10-CM | POA: Diagnosis not present

## 2023-07-23 DIAGNOSIS — I82623 Acute embolism and thrombosis of deep veins of upper extremity, bilateral: Secondary | ICD-10-CM | POA: Diagnosis not present

## 2023-07-23 DIAGNOSIS — J449 Chronic obstructive pulmonary disease, unspecified: Secondary | ICD-10-CM | POA: Diagnosis not present

## 2023-07-23 DIAGNOSIS — Z9049 Acquired absence of other specified parts of digestive tract: Secondary | ICD-10-CM | POA: Diagnosis not present

## 2023-07-23 DIAGNOSIS — C189 Malignant neoplasm of colon, unspecified: Secondary | ICD-10-CM | POA: Diagnosis not present

## 2023-07-23 NOTE — Progress Notes (Deleted)
 Palliative Medicine [facility]  [phone] [phone]   Name: Tina Mccullough Date: 07/23/2023 MRN: [id number]  DOB: [birth year]  Patient Care Team: [name], MD as PCP - General (Family Medicine) Janalyn Harder, MD (Inactive) as Consulting Physician (Dermatology)    REASON FOR CONSULTATION: Tina Mccullough is a 73 y.o. 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  Palliative ask to see for symptom management and goals of care.    SOCIAL HISTORY:     reports that she has been smoking e-cigarettes and cigarettes. She has a 37 pack-year smoking history. She has never used smokeless tobacco. She reports that she does not drink alcohol and does not use drugs.  ADVANCE DIRECTIVES:  None on file  CODE STATUS: Full code  PAST MEDICAL HISTORY: Past Medical History:  Diagnosis Date   Anxiety    Depression    DVT of axillary vein, acute left (HCC)    left subclavian vein   Hypertension    PONV (postoperative nausea and vomiting)    Pulmonary embolus (HCC)     PAST SURGICAL HISTORY:  Past Surgical History:  Procedure Laterality Date   ARTHROSCOPIC REPAIR ACL Left    BIOPSY  07/04/2023   Procedure: BIOPSY;  Surgeon: Kathi Der, MD;  Location: [facility];  Service: Gastroenterology;;   CESAREAN SECTION     COLONOSCOPY WITH PROPOFOL N/A 07/04/2023   Procedure: COLONOSCOPY WITH PROPOFOL;  Surgeon: Kathi Der, MD;  Location: [facility];  Service: Gastroenterology;  Laterality: N/A;   EXCISION ORAL TUMOR Left 11/28/2014   Procedure: SURGICAL REMOVAL OF ODOTOGENIC  TUMOR AND IMPACTED TOOTH NUMBER 17;  Surgeon: Felton Clinton, DDS;  Location: [facility];  Service: Oral Surgery;  Laterality: Left;   HAND SURGERY Bilateral    for trigger fingers   PARTIAL COLECTOMY Right 07/06/2023   Procedure: PARTIAL COLECTOMY;   Surgeon: Berna Bue, MD;  Location: [facility];  Service: General;  Laterality: Right;   SUBMUCOSAL TATTOO INJECTION  07/04/2023   Procedure: SUBMUCOSAL TATTOO INJECTION;  Surgeon: Kathi Der, MD;  Location: [facility];  Service: Gastroenterology;;    HEMATOLOGY/ONCOLOGY HISTORY:  Oncology History   No history exists.    ALLERGIES:  is allergic to beef-derived drug products, dog epithelium (canis lupus familiaris), pork-derived products, bee venom, diclofenac, and lisinopril.  MEDICATIONS:  Current Outpatient Medications  Medication Sig Dispense Refill   acetaminophen (TYLENOL) 500 MG tablet Take 2 tablets (1,000 mg total) by mouth every 6 (six) hours as needed.     amLODipine (NORVASC) 5 MG tablet Take 1 tablet (5 mg total) by mouth daily. 30 tablet 2   amoxicillin-clavulanate (AUGMENTIN) 875-125 MG tablet Take 1 tablet by mouth every 12 (twelve) hours for 7 days. 14 tablet 0   ascorbic acid (VITAMIN C) 500 MG tablet Take 1,500 mg by mouth in the morning.     Cholecalciferol (VITAMIN D-3) 125 MCG (5000 UT) TABS Take 10,000 Units by mouth daily.     diphenhydrAMINE (BENADRYL) 25 mg capsule Take 25 mg by mouth daily as needed (ONLY FOR ALLERGIC SYMPTOMS).     doxycycline (VIBRA-TABS) 100 MG tablet Take 1 tablet (100 mg total) by mouth every 12 (twelve) hours for 7 days. 14 tablet 0   EPINEPHrine (EPIPEN) 0.3 mg/0.3 mL DEVI Inject 0.3 mLs (0.3 mg total) into the muscle as needed. (Patient taking differently: Inject 0.3 mg into the  muscle as needed (allergic reaction).) 2 Device 1   ferrous sulfate 325 (65 FE) MG tablet Take 1 tablet (325 mg total) by mouth daily before breakfast. Preferably take on an empty stomach with a small cup of orange juice 30 tablet 2   folic acid (FOLVITE) 1 MG tablet Take 1 tablet (1 mg total) by mouth daily. 30 tablet 2   methocarbamol (ROBAXIN) 500 MG tablet Take 1 tablet (500 mg total) by mouth every 6 (six) hours as needed for muscle spasms 30 tablet  1   Misc Natural Products (SAMBUCUS ELDERBERRY IMMUNE) CHEW Chew 2 tablets by mouth in the morning.     Nicotine (NICODERM CQ TD) Place 1 patch onto the skin daily as needed (for smoking cessation- while hospitalized).     olmesartan (BENICAR) 40 MG tablet Take 1 tablet (40 mg total) by mouth daily. 30 tablet 2   pantoprazole (PROTONIX) 40 MG tablet Take 1 tablet (40 mg total) by mouth daily. 30 tablet 2   polycarbophil (FIBERCON) 625 MG tablet Take 1 tablet (625 mg total) by mouth 2 (two) times daily. 60 tablet 2   rivaroxaban (XARELTO) 20 MG TABS tablet Take 1 tablet (20 mg total) by mouth daily with food 30 tablet 11   simethicone (MYLICON) 40 MG/0.6ML drops Take 1.2 mLs (80 mg total) by mouth 4 (four) times daily as needed for flatulence (PRN bloating, gassiness). 30 mL 1   No current facility-administered medications for this visit.    VITAL SIGNS: There were no vitals taken for this visit. There were no vitals filed for this visit.  Estimated body mass index is 22.47 kg/m as calculated from the following:   Height as of 07/06/23: 5\' 5"  (1.651 m).   Weight as of 07/06/23: 135 lb (61.2 kg).  LABS: CBC:    Component Value Date/Time   WBC 11.0 (H) 07/17/2023 0543   HGB 7.5 (L) 07/17/2023 0543   HCT 24.8 (L) 07/17/2023 0543   PLT 807 (H) 07/17/2023 0543   MCV 80.0 07/17/2023 0543   NEUTROABS 8.6 (H) 07/17/2023 0543   LYMPHSABS 1.1 07/17/2023 0543   MONOABS 0.7 07/17/2023 0543   EOSABS 0.1 07/17/2023 0543   BASOSABS 0.0 07/17/2023 0543   Comprehensive Metabolic Panel:    Component Value Date/Time   NA 135 07/17/2023 0543   K 3.9 07/17/2023 0543   CL 106 07/17/2023 0543   CO2 23 07/17/2023 0543   BUN 20 07/17/2023 0543   CREATININE 0.60 07/17/2023 0543   GLUCOSE 94 07/17/2023 0543   CALCIUM 8.3 (L) 07/17/2023 0543   AST 12 (L) 07/16/2023 0451   ALT 15 07/16/2023 0451   ALKPHOS 64 07/16/2023 0451   BILITOT 0.4 07/16/2023 0451   PROT 4.8 (L) 07/16/2023 0451   ALBUMIN 2.1  (L) 07/17/2023 0543    RADIOGRAPHIC STUDIES: CT ABDOMEN PELVIS W CONTRAST Result Date: 07/13/2023 CLINICAL DATA:  Postop abdominal pain. Status post right colectomy for colon carcinoma. * Tracking Code: BO * EXAM: CT ABDOMEN AND PELVIS WITH CONTRAST TECHNIQUE: Multidetector CT imaging of the abdomen and pelvis was performed using the standard protocol following bolus administration of intravenous contrast. RADIATION DOSE REDUCTION: This exam was performed according to the departmental dose-optimization program which includes automated exposure control, adjustment of the mA and/or kV according to patient size and/or use of iterative reconstruction technique. CONTRAST:  OMNIPAQUE IOHEXOL 300 MG/ML  SOLN COMPARISON:  07/01/2023 FINDINGS: Lower Chest: New small bilateral pleural effusions and bilateral lower lobe atelectasis, right  side greater than left. Hepatobiliary: A few tiny sub-cm hepatic cysts remain stable. No suspicious hepatic masses identified. Gallbladder is unremarkable. No evidence of biliary ductal dilatation. Pancreas:  No mass or inflammatory changes. Spleen: Within normal limits in size and appearance. Adrenals/Urinary Tract: Stable bilateral adrenal hyperplasia. No suspicious masses identified. No evidence of ureteral calculi or hydronephrosis. Small amount of gas in urinary bladder is presumably due to recent catheterization. Stomach/Bowel: Postop changes are seen from right hemicolectomy. No mass identified. Mildly dilated small bowel loops with air-fluid levels are seen, consistent with mild ileus. Colonic diverticulosis is again noted, without signs of diverticulitis. Mild ascites is seen, however, no abscess identified. Vascular/Lymphatic: No pathologically enlarged lymph nodes. No acute vascular findings. Reproductive: Tiny less than 1 cm calcified uterine fibroid again noted. Vaginal pessary remains in place. Other:  Moderate diffuse body wall edema. Musculoskeletal:  No suspicious  bone lesions identified. IMPRESSION: Postop changes from right hemicolectomy. No evidence of recurrent or metastatic disease. Mild postop ileus pattern. Mild ascites. No evidence of abscess. Moderate diffuse body wall edema. New small bilateral pleural effusions and bilateral lower lobe atelectasis, right side greater than left. Electronically Signed   By: Danae Orleans M.D.   On: 07/13/2023 16:49   CT CHEST W CONTRAST Result Date: 07/02/2023 CLINICAL DATA:  Colon cancer staging. EXAM: CT CHEST WITH CONTRAST TECHNIQUE: Multidetector CT imaging of the chest was performed during intravenous contrast administration. RADIATION DOSE REDUCTION: This exam was performed according to the departmental dose-optimization program which includes automated exposure control, adjustment of the mA and/or kV according to patient size and/or use of iterative reconstruction technique. CONTRAST:  75mL OMNIPAQUE IOHEXOL 300 MG/ML  SOLN COMPARISON:  CT abdomen and pelvis 06/30/2022 FINDINGS: Cardiovascular: No significant vascular findings. Normal heart size. No pericardial effusion. Mediastinum/Nodes: Hypodense right thyroid nodule measures 11 mm. There are no enlarged mediastinal or hilar lymph nodes identified. Visualized esophagus is within normal limits. Lungs/Pleura: There is patchy airspace consolidation in the bilateral lower lobes, right greater than left. Mild emphysematous changes are present. There is no pleural effusion or pneumothorax. Upper Abdomen: There are 2 rounded hypodensities in the liver favored as cysts, unchanged. Nodular thickening of the adrenal glands again noted. Musculoskeletal: There are healed posterior left rib fractures. No acute fractures are seen. No focal osseous lesions are identified. IMPRESSION: 1. No evidence for metastatic disease in the chest. 2. Patchy airspace consolidation in the bilateral lower lobes, right greater than left, worrisome for pneumonia. 3. Emphysema. Emphysema (ICD10-J43.9).  Electronically Signed   By: Darliss Cheney M.D.   On: 07/02/2023 19:19   CT ABDOMEN PELVIS W CONTRAST Result Date: 07/01/2023 CLINICAL DATA:  Right lower quadrant pain for 3 days. Blood in stool. Constipation. * Tracking Code: BO * EXAM: CT ABDOMEN AND PELVIS WITH CONTRAST TECHNIQUE: Multidetector CT imaging of the abdomen and pelvis was performed using the standard protocol following bolus administration of intravenous contrast. RADIATION DOSE REDUCTION: This exam was performed according to the departmental dose-optimization program which includes automated exposure control, adjustment of the mA and/or kV according to patient size and/or use of iterative reconstruction technique. CONTRAST:  ISOVUE-300 IOPAMIDOL (ISOVUE-300) INJECTION 61% COMPARISON:  06/24/2018 FINDINGS: Lower Chest: No acute findings. Hepatobiliary: No suspicious hepatic masses identified. A few tiny hepatic cysts are noted. Gallbladder is unremarkable. No evidence of biliary ductal dilatation. Pancreas:  No mass or inflammatory changes. Spleen: Within normal limits in size and appearance. Adrenals/Urinary Tract: Stable diffuse nodular thickening of both adrenal glands, consistent  with nodular hyperplasia. No suspicious renal masses identified. No evidence of ureteral calculi or hydronephrosis. Stomach/Bowel: The right colon is dilated and contains a large amount of stool. A masslike constricting lesion is seen in the hepatic flexure of the colon measuring approximately 4 cm in length, highly suspicious for obstructing colon carcinoma. Adjacent sub-centimeter pericolonic lymph nodes at this site are seen, suspicious for local lymph node metastases. Mild right colonic wall thickening and adjacent soft tissue stranding also seen, consistent with colitis. No evidence of pneumatosis, free intraperitoneal air, or abscess. Normal appendix visualized. Vascular/Lymphatic: No pathologically enlarged lymph nodes. No acute vascular findings.  Reproductive: No mass or other significant abnormality. Pessary noted in vagina. Other:  None. Musculoskeletal:  No suspicious bone lesions identified. IMPRESSION: Obstructing masslike lesion in hepatic flexure of colon, highly suspicious for colon carcinoma. Dilated ascending colon shows mild wall thickening and adjacent stranding, highly suspicious for colitis. No evidence of perforation or abscess. Adjacent sub-centimeter pericolonic lymph nodes at this site. Local lymph node metastases cannot be excluded. Mild right colonic wall thickening and adjacent soft tissue stranding, consistent with colitis. No evidence of distant metastatic disease. These results will be called to the ordering clinician or representative by the Radiologist Assistant, and communication documented in the PACS or [employer]. Electronically Signed   By: Danae Orleans M.D.   On: 07/01/2023 15:18    PERFORMANCE STATUS (ECOG) : {CHL ONC ECOG IO:[id number]}  Review of Systems Unless otherwise noted, a complete review of systems is negative.  Physical Exam General: NAD Cardiovascular: regular rate and rhythm Pulmonary: clear ant fields Abdomen: soft, nontender, + bowel sounds Extremities: no edema, no joint deformities Skin: no rashes Neurological: Alert and oriented x3  IMPRESSION: *** I introduced myself, Emmalyne Giacomo RN, and Palliative's role in collaboration with the oncology team. Concept of Palliative Care was introduced as specialized medical care for people and their families living with serious illness.  It focuses on providing relief from the symptoms and stress of a serious illness.  The goal is to improve quality of life for both the patient and the family. Values and goals of care important to patient and family were attempted to be elicited.    We discussed *** current illness and what it means in the larger context of *** on-going co-morbidities. Natural disease trajectory and expectations were discussed.  I  discussed the importance of continued conversation with family and their medical providers regarding overall plan of care and treatment options, ensuring decisions are within the context of the patients values and GOCs.  PLAN: Established therapeutic relationship. Education provided on palliative's role in collaboration with their Oncology/Radiation team. I will plan to see patient back in 2-4 weeks in collaboration to other oncology appointments.    Patient expressed understanding and was in agreement with this plan. She also understands that She can call the clinic at any time with any questions, concerns, or complaints.   Thank you for your referral and allowing Palliative to assist in Tina Mccullough's care.   Number and complexity of problems addressed: ***HIGH - 1 or more chronic illnesses with SEVERE exacerbation, progression, or side effects of treatment - advanced cancer, pain. Any controlled substances utilized were prescribed in the context of palliative care.   Visit consisted of counseling and education dealing with the complex and emotionally intense issues of symptom management and palliative care in the setting of serious and potentially life-threatening illness.  Signed by: Willette Alma, AGPCNP-BC Palliative Medicine Team/[facility]

## 2023-07-27 ENCOUNTER — Telehealth: Payer: Self-pay | Admitting: Hematology and Oncology

## 2023-07-27 NOTE — Telephone Encounter (Signed)
 Spoke with patient confirming upcoming appointment change

## 2023-07-27 NOTE — Telephone Encounter (Signed)
Patient is aware of rescheduled appointment times/dates 

## 2023-07-28 ENCOUNTER — Ambulatory Visit: Payer: PPO | Admitting: Hematology and Oncology

## 2023-07-28 ENCOUNTER — Other Ambulatory Visit: Payer: PPO

## 2023-07-28 ENCOUNTER — Inpatient Hospital Stay: Payer: PPO

## 2023-07-30 LAB — MOLECULAR PATHOLOGY

## 2023-08-05 DIAGNOSIS — T8189XA Other complications of procedures, not elsewhere classified, initial encounter: Secondary | ICD-10-CM | POA: Diagnosis not present

## 2023-08-09 ENCOUNTER — Telehealth: Payer: Self-pay

## 2023-08-09 NOTE — Telephone Encounter (Signed)
 Spoke with patient and confirmed visit on 08/10/23

## 2023-08-10 ENCOUNTER — Inpatient Hospital Stay: Payer: PPO

## 2023-08-10 ENCOUNTER — Inpatient Hospital Stay: Payer: PPO | Admitting: Hematology and Oncology

## 2023-08-10 ENCOUNTER — Other Ambulatory Visit: Payer: Self-pay | Admitting: *Deleted

## 2023-08-10 ENCOUNTER — Encounter: Payer: Self-pay | Admitting: Hematology and Oncology

## 2023-08-10 VITALS — BP 141/87 | HR 100 | Temp 98.2°F | Resp 17 | Ht 65.0 in | Wt 130.1 lb

## 2023-08-10 DIAGNOSIS — C184 Malignant neoplasm of transverse colon: Secondary | ICD-10-CM | POA: Diagnosis not present

## 2023-08-10 DIAGNOSIS — Z803 Family history of malignant neoplasm of breast: Secondary | ICD-10-CM | POA: Insufficient documentation

## 2023-08-10 DIAGNOSIS — C189 Malignant neoplasm of colon, unspecified: Secondary | ICD-10-CM

## 2023-08-10 DIAGNOSIS — I82622 Acute embolism and thrombosis of deep veins of left upper extremity: Secondary | ICD-10-CM

## 2023-08-10 DIAGNOSIS — Z86718 Personal history of other venous thrombosis and embolism: Secondary | ICD-10-CM | POA: Diagnosis not present

## 2023-08-10 DIAGNOSIS — I10 Essential (primary) hypertension: Secondary | ICD-10-CM | POA: Insufficient documentation

## 2023-08-10 DIAGNOSIS — F17210 Nicotine dependence, cigarettes, uncomplicated: Secondary | ICD-10-CM | POA: Insufficient documentation

## 2023-08-10 DIAGNOSIS — Z7901 Long term (current) use of anticoagulants: Secondary | ICD-10-CM | POA: Insufficient documentation

## 2023-08-10 DIAGNOSIS — Z79899 Other long term (current) drug therapy: Secondary | ICD-10-CM | POA: Diagnosis not present

## 2023-08-10 LAB — CMP (CANCER CENTER ONLY)
ALT: 7 U/L (ref 0–44)
AST: 10 U/L — ABNORMAL LOW (ref 15–41)
Albumin: 3.9 g/dL (ref 3.5–5.0)
Alkaline Phosphatase: 92 U/L (ref 38–126)
Anion gap: 7 (ref 5–15)
BUN: 12 mg/dL (ref 8–23)
CO2: 28 mmol/L (ref 22–32)
Calcium: 9.8 mg/dL (ref 8.9–10.3)
Chloride: 107 mmol/L (ref 98–111)
Creatinine: 0.76 mg/dL (ref 0.44–1.00)
GFR, Estimated: >60 mL/min (ref >60)
Glucose, Bld: 95 mg/dL (ref 70–99)
Potassium: 3.7 mmol/L (ref 3.5–5.1)
Sodium: 142 mmol/L (ref 135–145)
Total Bilirubin: 0.3 mg/dL (ref 0.0–1.2)
Total Protein: 6.8 g/dL (ref 6.5–8.1)

## 2023-08-10 LAB — CBC WITH DIFFERENTIAL/PLATELET
Abs Immature Granulocytes: 0.06 10*3/uL (ref 0.00–0.07)
Basophils Absolute: 0 10*3/uL (ref 0.0–0.1)
Basophils Relative: 0 %
Eosinophils Absolute: 0.1 10*3/uL (ref 0.0–0.5)
Eosinophils Relative: 1 %
HCT: 36.7 % (ref 36.0–46.0)
Hemoglobin: 10.9 g/dL — ABNORMAL LOW (ref 12.0–15.0)
Immature Granulocytes: 1 %
Lymphocytes Relative: 19 %
Lymphs Abs: 2 10*3/uL (ref 0.7–4.0)
MCH: 23.7 pg — ABNORMAL LOW (ref 26.0–34.0)
MCHC: 29.7 g/dL — ABNORMAL LOW (ref 30.0–36.0)
MCV: 79.8 fL — ABNORMAL LOW (ref 80.0–100.0)
Monocytes Absolute: 0.7 10*3/uL (ref 0.1–1.0)
Monocytes Relative: 7 %
Neutro Abs: 7.5 10*3/uL (ref 1.7–7.7)
Neutrophils Relative %: 72 %
Platelets: 704 10*3/uL — ABNORMAL HIGH (ref 150–400)
RBC: 4.6 MIL/uL (ref 3.87–5.11)
RDW: 19.9 % — ABNORMAL HIGH (ref 11.5–15.5)
WBC: 10.4 10*3/uL (ref 4.0–10.5)
nRBC: 0 % (ref 0.0–0.2)

## 2023-08-10 NOTE — Assessment & Plan Note (Signed)
 Colon Cancer (Stage II, T3N0) Post-surgical patient with a history of obstruction and perforation. Uncertainty regarding tumor invasion into the omentum. Discussed the benefits and risks of adjuvant chemotherapy (FOLFOX regimen) to reduce the chance of recurrence. -Plan to initiate FOLFOX chemotherapy, every two weeks for three months (6 cycles). -Consider minimal residual disease (MRD) blood test to detect any tumor DNA, which may influence decision on chemotherapy. -She is very reluctant at this time to consider adj chemo. She wants to do MRD testing and consider if its positive. -We discussed if the cancer were to return and be metastatic then we can certainly consider treating her at that time, but the intent of treatment is palliative.  Hypertension Patient on increased dose of Benicar (40mg ) post-surgery, with slightly elevated blood pressure. -Continue Benicar 40mg  daily. -Recommend patient to log blood pressures for primary care provider (Dr. Clelia Croft) to assess.  Blood Clots History of blood clots in both arms during recent hospitalization. -Continue current blood thinner medication.  General Health Maintenance -Continue current medications including Protonix for GERD, iron supplement, Benadryl as needed, nicotine patch as needed, folic acid, and multivitamins. -Plan for surveillance colonoscopy at 1 year, 3 years, and 5 years post-surgery, then every 5 years if results are good. -Plan for scans every 6 months for the first 3 years, then annually for the next 2 years. If no cancer detected at 5 years, discontinue scans. -Check tumor markers in blood every 3 months. -Plan for blood work today, and MRD testing -FU with me in 3 weeks.

## 2023-08-10 NOTE — Progress Notes (Signed)
 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 Uncertainty regarding tumor invasion into the omentum. Discussed the benefits and risks of adjuvant chemotherapy (FOLFOX regimen) to reduce the chance of recurrence. -Plan to initiate FOLFOX chemotherapy, every two weeks for three months (6 cycles). -Consider minimal residual disease (MRD) blood test to detect any tumor DNA, which may influence decision on chemotherapy. -She is very reluctant at this time to consider adj chemo. She wants to do MRD testing and consider if its positive. -We discussed if the cancer were to return and be metastatic then we can certainly consider treating her at that time, but the intent of treatment is palliative.  Hypertension Patient on increased dose of Benicar (40mg ) post-surgery, with slightly elevated blood pressure. -Continue Benicar 40mg  daily. -Recommend patient to log blood pressures for primary care provider (Dr. Clelia Croft) to assess.  Blood Clots History of blood clots in both arms during recent hospitalization. -Continue current blood thinner medication.  General Health Maintenance -Continue current medications including Protonix for GERD, iron supplement, Benadryl as needed, nicotine patch as needed, folic acid, and multivitamins. -Plan for surveillance colonoscopy at 1 year, 3 years, and 5 years post-surgery, then every 5 years if results are good. -Plan for scans every 6 months for the first 3 years, then annually for the next 2 years. If no cancer detected at 5 years, discontinue scans. -Check tumor markers in blood every 3 months. -Plan for blood work  today, and MRD testing -FU with me in 3 weeks.  Orders Placed This Encounter  Procedures   CBC with Differential/Platelet    Standing Status:   Standing    Number of Occurrences:   22    Expiration Date:   08/09/2024   CMP (Cancer Center only)    Standing Status:   Future    Number of Occurrences:   1    Expiration Date:   08/09/2024   CEA (IN HOUSE-[facility])    Standing Status:   Standing    Number of Occurrences:   3    Expiration Date:   08/09/2024     HISTORY OF PRESENTING ILLNESS:  Tina Mccullough 73 y.o. female is here because of Adenocarcinoma colon  Tina Mccullough 73 y.o. female this is a very pleasant female patient who went to [facility] on 07/01/2023 due to weakness, abdominal pain, and bloody stools x 4 days.  Patient saw her primary physician for complaints as noted and was sent to have an outpatient CT scan.  That scan showed a mass with colitis.  Also was noted a drop in hemoglobin, which was concerning for colon cancer.   Workup in the ED included CT scans which showed obstructing masslike lesion in hepatic flexure of colon highly suspicious for colon carcinoma. Status post colonoscopy on 07/04/2023 which also showed large obstructing and infiltrative mass in the proximal transverse colon.  Pathology from biopsy of transverse colon showed adenocarcinoma, superficial fragments.  No submucosa present to evaluate for submucosal invasion.  She had right colon resection which showed invasive moderately differentiated adenocarcinoma extending through submucosa muscularis propria and into subserosal subserosal adipose tissue, negative margins, unremarkable appendix.  Mismatch repair protein interpretation was abnormal with loss of major and  minor MMRMLH1 and PMS2 Given the presence of some high risk features such as possible imminent perforation as well as presentation of obstruction as well as adherent omentum, we have presented her in the tumor board to discuss role of  chemotherapy. We agreed that she should be considered for adjuvant chemo with FOLFOX for 3 months. I do not see an addendum on the BRAF testing and the pathology report, we will discuss with the pathology.  Discussed the use of AI scribe software for clinical note transcription with the patient, who gave verbal consent to proceed.  History of Present Illness    Tina Mccullough "Tina Mccullough" is a 73 year old female with colon cancer who presents for follow-up after surgery and discussion of chemotherapy options. She is accompanied by her brother.  She is here for follow-up after her recent surgery for colon cancer. Her surgical wound, which had been infected, is healing well, and the wound vac was removed this morning.  She is concerned about potential side effects, particularly nausea, as she recalls her father's experience with chemotherapy and her late sister-in-law's adverse reactions to treatment. She is also considering alternative treatments and is interested in discussing these with specialists. She is concerned about the impact of chemotherapy on her quality of life, particularly regarding nausea and fatigue.  She mentions an increased heart rate since her hospital discharge and inquires if chemotherapy could affect it.  Her brother and family were present at the time of the visit. No other pertinent ROS noted.  MEDICAL HISTORY:  Past Medical History:  Diagnosis Date   Anxiety    Depression    DVT of axillary vein, acute left (HCC)    left subclavian vein   Hypertension    PONV (postoperative nausea and vomiting)    Pulmonary embolus (HCC)     SURGICAL HISTORY: Past Surgical History:  Procedure Laterality Date   ARTHROSCOPIC REPAIR ACL Left    BIOPSY  07/04/2023   Procedure: BIOPSY;  Surgeon: Kathi Der, MD;  Location: [facility];  Service: Gastroenterology;;   CESAREAN SECTION     COLONOSCOPY WITH PROPOFOL N/A 07/04/2023   Procedure: COLONOSCOPY WITH PROPOFOL;   Surgeon: Kathi Der, MD;  Location: [facility];  Service: Gastroenterology;  Laterality: N/A;   EXCISION ORAL TUMOR Left 11/28/2014   Procedure: SURGICAL REMOVAL OF ODOTOGENIC  TUMOR AND IMPACTED TOOTH NUMBER 17;  Surgeon: Felton Clinton, DDS;  Location: [facility];  Service: Oral Surgery;  Laterality: Left;   HAND SURGERY Bilateral    for trigger fingers   PARTIAL COLECTOMY Right 07/06/2023   Procedure: PARTIAL COLECTOMY;  Surgeon: Berna Bue, MD;  Location: [facility];  Service: General;  Laterality: Right;   SUBMUCOSAL TATTOO INJECTION  07/04/2023   Procedure: SUBMUCOSAL TATTOO INJECTION;  Surgeon: Kathi Der, MD;  Location: [facility];  Service: Gastroenterology;;    SOCIAL HISTORY: Social History   Socioeconomic History   Marital status: Widowed    Spouse name: Not on file   Number of children: Not on file   Years of education: Not on file   Highest education level: Not on file  Occupational History   Not on file  Tobacco Use   Smoking status: Every Day    Current packs/day: 1.00    Average packs/day: 1 pack/day for 37.0 years (37.0 ttl pk-yrs)    Types: E-cigarettes, Cigarettes   Smokeless tobacco: Never   Tobacco comments:    Counseled on reduce to quit. Information on classes shared  Vaping Use   Vaping status: Never Used  Substance and Sexual Activity   Alcohol use: No   Drug use: No   Sexual activity: Not on file  Other Topics Concern   Not on file  Social History Narrative   Not on file   Social Drivers of Health   Financial Resource Strain: Not on file  Food Insecurity: No Food Insecurity (07/02/2023)   Hunger Vital Sign    Worried About Running Out of Food in the Last Year: Never true    Ran Out of Food in the Last Year: Never true  Transportation Needs: No Transportation Needs (07/02/2023)   PRAPARE - Administrator, Civil Service (Medical): No    Lack of Transportation (Non-Medical): No  Physical Activity: Not  on file  Stress: Not on file  Social Connections: Moderately Integrated (07/03/2023)   Social Connection and Isolation Panel [NHANES]    Frequency of Communication with Friends and Family: Three times a week    Frequency of Social Gatherings with Friends and Family: Twice a week    Attends Religious Services: 1 to 4 times per year    Active Member of [employer] or Organizations: No    Attends Banker Meetings: Never    Marital Status: Married  Recent Concern: Social Connections - Socially Isolated (07/03/2023)   Social Connection and Isolation Panel [NHANES]    Frequency of Communication with Friends and Family: Never    Frequency of Social Gatherings with Friends and Family: Never    Attends Religious Services: Never    Database administrator or Organizations: Not on file    Attends Banker Meetings: Never    Marital Status: Married  Catering manager Violence: Not At Risk (07/02/2023)   Humiliation, Afraid, Rape, and Kick questionnaire    Fear of Current or Ex-Partner: No    Emotionally Abused: No    Physically Abused: No    Sexually Abused: No    FAMILY HISTORY: No family history on file.  ALLERGIES:  is allergic to beef-derived drug products, dog epithelium (canis lupus familiaris), pork-derived products, bee venom, diclofenac, and lisinopril.  MEDICATIONS:  Current Outpatient Medications  Medication Sig Dispense Refill   acetaminophen (TYLENOL) 500 MG tablet Take 2 tablets (1,000 mg total) by mouth every 6 (six) hours as needed.     amLODipine (NORVASC) 5 MG tablet Take 1 tablet (5 mg total) by mouth daily. 30 tablet 2   ascorbic acid (VITAMIN C) 500 MG tablet Take 1,500 mg by mouth in the morning.     Cholecalciferol (VITAMIN D-3) 125 MCG (5000 UT) TABS Take 10,000 Units by mouth daily.     diphenhydrAMINE (BENADRYL) 25 mg capsule Take 25 mg by mouth daily as needed (ONLY FOR ALLERGIC SYMPTOMS).     EPINEPHrine (EPIPEN) 0.3 mg/0.3 mL DEVI Inject 0.3 mLs  (0.3 mg total) into the muscle as needed. (Patient taking differently: Inject 0.3 mg into the muscle as needed (allergic reaction).) 2 Device 1   ferrous sulfate 325 (65 FE) MG tablet Take 1 tablet (325 mg total) by mouth daily before breakfast. Preferably take on an empty stomach with a small cup of orange juice 30 tablet 2   folic acid (FOLVITE) 1 MG tablet Take 1 tablet (1 mg total) by mouth daily. 30 tablet 2   methocarbamol (ROBAXIN) 500 MG tablet Take 1 tablet (500 mg total) by mouth every 6 (six) hours as needed for muscle spasms 30 tablet 1  Misc Natural Products (SAMBUCUS ELDERBERRY IMMUNE) CHEW Chew 2 tablets by mouth in the morning.     Nicotine (NICODERM CQ TD) Place 1 patch onto the skin daily as needed (for smoking cessation- while hospitalized).     olmesartan (BENICAR) 40 MG tablet Take 1 tablet (40 mg total) by mouth daily. 30 tablet 2   pantoprazole (PROTONIX) 40 MG tablet Take 1 tablet (40 mg total) by mouth daily. 30 tablet 2   rivaroxaban (XARELTO) 20 MG TABS tablet Take 1 tablet (20 mg total) by mouth daily with food 30 tablet 11   No current facility-administered medications for this visit.     PHYSICAL EXAMINATION: ECOG PERFORMANCE STATUS: 0 - Asymptomatic  Vitals:   08/10/23 1325 08/10/23 1327  BP: (!) 152/83 (!) 141/87  Pulse: 100   Resp: 17   Temp: 98.2 F (36.8 C)   SpO2: 99%    Filed Weights   08/10/23 1325  Weight: 130 lb 1 oz (59 kg)    GENERAL:alert, no distress and comfortable   LABORATORY DATA:  I have reviewed the data as listed Lab Results  Component Value Date   WBC 10.4 08/10/2023   HGB 10.9 (L) 08/10/2023   HCT 36.7 08/10/2023   MCV 79.8 (L) 08/10/2023   PLT 704 (H) 08/10/2023     Chemistry      Component Value Date/Time   NA 142 08/10/2023 1424   K 3.7 08/10/2023 1424   CL 107 08/10/2023 1424   CO2 28 08/10/2023 1424   BUN 12 08/10/2023 1424   CREATININE 0.76 08/10/2023 1424      Component Value Date/Time   CALCIUM 9.8  08/10/2023 1424   ALKPHOS 92 08/10/2023 1424   AST 10 (L) 08/10/2023 1424   ALT 7 08/10/2023 1424   BILITOT 0.3 08/10/2023 1424       RADIOGRAPHIC STUDIES: I have personally reviewed the radiological images as listed and agreed with the findings in the report. DG Chest 1 View Result Date: 07/15/2023 CLINICAL DATA:  Pneumonia EXAM: CHEST  1 VIEW COMPARISON:  CT chest, 07/02/2023, chest radiographs, 07/02/2023 FINDINGS: The heart size and mediastinal contours are within normal limits. Elevation of the right hemidiaphragm atelectasis or consolidation of the right lung base likely associated with a small pleural effusion. Left lung normally aerated. The visualized skeletal structures are unremarkable. IMPRESSION: Elevation of the right hemidiaphragm with atelectasis or consolidation of the right lung base likely associated with a small pleural effusion. Radiographic findings similar to recent CT. Electronically Signed   By: Jearld Lesch M.D.   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 Anticoagulation: Xarelto prior to admission,, heparin during admission that was stopped briefly for surgical procedure. Comparison Study: Previous exam on 07/05/23 (LUEV) positive for DVT in axillary  and SVT in basilic first seen on 05/05/2023 Performing Technologist: Ernestene Mention RVT, RDMS  Examination Guidelines: A complete evaluation includes B-mode imaging,  spectral Doppler, color Doppler, and power Doppler as needed of all accessible portions of each vessel. Bilateral testing is considered an integral part of a complete examination. Limited examinations for reoccurring indications may be performed as noted.  Right Findings: +----------+------------+---------+-----------+----------+-------+ RIGHT     CompressiblePhasicitySpontaneousPropertiesSummary +----------+------------+---------+-----------+----------+-------+ IJV           Full       Yes       Yes                      +----------+------------+---------+-----------+----------+-------+ Subclavian    Full       Yes       Yes                      +----------+------------+---------+-----------+----------+-------+ Axillary      Full       Yes       Yes                      +----------+------------+---------+-----------+----------+-------+ Brachial      Full       Yes       Yes                      +----------+------------+---------+-----------+----------+-------+ Radial        Full                                          +----------+------------+---------+-----------+----------+-------+ Ulnar         Full                                          +----------+------------+---------+-----------+----------+-------+ Cephalic      None       No        No                Acute  +----------+------------+---------+-----------+----------+-------+ Basilic     Partial      Yes       Yes               Acute  +----------+------------+---------+-----------+----------+-------+  Left Findings: +----------+------------+---------+-----------+----------+-------+ LEFT      CompressiblePhasicitySpontaneousPropertiesSummary +----------+------------+---------+-----------+----------+-------+ Subclavian    Full       Yes       Yes                      +----------+------------+---------+-----------+----------+-------+  Summary:  Right: No evidence of deep vein  thrombosis in the upper extremity. Findings consistent with acute superficial vein thrombosis involving the right basilic vein and right cephalic vein.  Left: No evidence of thrombosis in the subclavian.  *See table(s) above for measurements and observations.  Diagnosing physician: Coral Else MD Electronically signed by Coral Else MD on 07/14/2023 at 5:54:03 PM.    Final    CT ABDOMEN PELVIS W CONTRAST Result Date: 07/13/2023 CLINICAL DATA:  Postop abdominal pain. Status post right colectomy for colon carcinoma. * Tracking Code: BO * EXAM: CT ABDOMEN AND PELVIS WITH CONTRAST TECHNIQUE: Multidetector CT imaging of the abdomen and pelvis was performed using the standard protocol following  bolus administration of intravenous contrast. RADIATION DOSE REDUCTION: This exam was performed according to the departmental dose-optimization program which includes automated exposure control, adjustment of the mA and/or kV according to patient size and/or use of iterative reconstruction technique. CONTRAST:  OMNIPAQUE IOHEXOL 300 MG/ML  SOLN COMPARISON:  07/01/2023 FINDINGS: Lower Chest: New small bilateral pleural effusions and bilateral lower lobe atelectasis, right side greater than left. Hepatobiliary: A few tiny sub-cm hepatic cysts remain stable. No suspicious hepatic masses identified. Gallbladder is unremarkable. No evidence of biliary ductal dilatation. Pancreas:  No mass or inflammatory changes. Spleen: Within normal limits in size and appearance. Adrenals/Urinary Tract: Stable bilateral adrenal hyperplasia. No suspicious masses identified. No evidence of ureteral calculi or hydronephrosis. Small amount of gas in urinary bladder is presumably due to recent catheterization. Stomach/Bowel: Postop changes are seen from right hemicolectomy. No mass identified. Mildly dilated small bowel loops with air-fluid levels are seen, consistent with mild ileus. Colonic diverticulosis is again noted, without signs of  diverticulitis. Mild ascites is seen, however, no abscess identified. Vascular/Lymphatic: No pathologically enlarged lymph nodes. No acute vascular findings. Reproductive: Tiny less than 1 cm calcified uterine fibroid again noted. Vaginal pessary remains in place. Other:  Moderate diffuse body wall edema. Musculoskeletal:  No suspicious bone lesions identified. IMPRESSION: Postop changes from right hemicolectomy. No evidence of recurrent or metastatic disease. Mild postop ileus pattern. Mild ascites. No evidence of abscess. Moderate diffuse body wall edema. New small bilateral pleural effusions and bilateral lower lobe atelectasis, right side greater than left. Electronically Signed   By: Danae Orleans M.D.   On: 07/13/2023 16:49    All questions were answered. The patient knows to call the clinic with any problems, questions or concerns. I spent 40 minutes in the care of this patient including H and P, review of records, counseling and coordination of care.     Rachel Moulds, MD 08/10/2023 4:10 PM

## 2023-08-10 NOTE — Addendum Note (Signed)
 Addended by: Jackquline Denmark on: 08/10/2023 04:10 PM   Modules accepted: Level of Service

## 2023-08-11 ENCOUNTER — Other Ambulatory Visit (HOSPITAL_COMMUNITY): Payer: Self-pay

## 2023-08-13 NOTE — Progress Notes (Signed)
 Palliative Medicine [facility]  Telephone:([phone] Fax:([phone]   Name: Tina Mccullough Date: 08/13/2023 MRN: [id number]  DOB: 04-Jun-1951  Patient Care Team: [name], MD as PCP - General (Family Medicine) Janalyn Harder, MD (Inactive) as Consulting Physician (Dermatology)    REASON FOR CONSULTATION: Atiyana Mccullough is a 73 y.o. 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  Palliative ask to see for symptom management and goals of care.    SOCIAL HISTORY:     reports that she has been smoking e-cigarettes and cigarettes. She has a 37 pack-year smoking history. She has never used smokeless tobacco. She reports that she does not drink alcohol and does not use drugs.  ADVANCE DIRECTIVES:  None on file  CODE STATUS: Full code  PAST MEDICAL HISTORY: Past Medical History:  Diagnosis Date   Anxiety    Depression    DVT of axillary vein, acute left (HCC)    left subclavian vein   Hypertension    PONV (postoperative nausea and vomiting)    Pulmonary embolus (HCC)     PAST SURGICAL HISTORY:  Past Surgical History:  Procedure Laterality Date   ARTHROSCOPIC REPAIR ACL Left    BIOPSY  07/04/2023   Procedure: BIOPSY;  Surgeon: Kathi Der, MD;  Location: [facility];  Service: Gastroenterology;;   CESAREAN SECTION     COLONOSCOPY WITH PROPOFOL N/A 07/04/2023   Procedure: COLONOSCOPY WITH PROPOFOL;  Surgeon: Kathi Der, MD;  Location: [facility];  Service: Gastroenterology;  Laterality: N/A;   EXCISION ORAL TUMOR Left 11/28/2014   Procedure: SURGICAL REMOVAL OF ODOTOGENIC  TUMOR AND IMPACTED TOOTH NUMBER 17;  Surgeon: Felton Clinton, DDS;  Location: [facility];  Service: Oral Surgery;  Laterality: Left;   HAND SURGERY Bilateral    for trigger fingers   PARTIAL COLECTOMY Right 07/06/2023   Procedure: PARTIAL COLECTOMY;   Surgeon: Berna Bue, MD;  Location: [facility];  Service: General;  Laterality: Right;   SUBMUCOSAL TATTOO INJECTION  07/04/2023   Procedure: SUBMUCOSAL TATTOO INJECTION;  Surgeon: Kathi Der, MD;  Location: [facility];  Service: Gastroenterology;;    HEMATOLOGY/ONCOLOGY HISTORY:  Oncology History  Colon adenocarcinoma (HCC)  07/09/2023 Initial Diagnosis   Colon adenocarcinoma (HCC)   08/10/2023 Cancer Staging   Staging form: Colon and Rectum, AJCC 8th Edition - Clinical: Stage IIA (cT3, cN0, cM0) - Signed by Rachel Moulds, MD on 08/10/2023 Total positive nodes: 0     ALLERGIES:  is allergic to beef-derived drug products, dog epithelium (canis lupus familiaris), pork-derived products, bee venom, diclofenac, and lisinopril.  MEDICATIONS:  Current Outpatient Medications  Medication Sig Dispense Refill   acetaminophen (TYLENOL) 500 MG tablet Take 2 tablets (1,000 mg total) by mouth every 6 (six) hours as needed.     amLODipine (NORVASC) 5 MG tablet Take 1 tablet (5 mg total) by mouth daily. 30 tablet 2   ascorbic acid (VITAMIN C) 500 MG tablet Take 1,500 mg by mouth in the morning.     Cholecalciferol (VITAMIN D-3) 125 MCG (5000 UT) TABS Take 10,000 Units by mouth daily.     diphenhydrAMINE (BENADRYL) 25 mg capsule Take 25 mg by mouth daily as needed (ONLY FOR ALLERGIC SYMPTOMS).     EPINEPHrine (EPIPEN) 0.3 mg/0.3 mL DEVI Inject 0.3 mLs (0.3 mg total) into the muscle as needed. (Patient taking differently: Inject 0.3 mg into the muscle as needed (allergic  reaction).) 2 Device 1   ferrous sulfate 325 (65 FE) MG tablet Take 1 tablet (325 mg total) by mouth daily before breakfast. Preferably take on an empty stomach with a small cup of orange juice 30 tablet 2   folic acid (FOLVITE) 1 MG tablet Take 1 tablet (1 mg total) by mouth daily. 30 tablet 2   methocarbamol (ROBAXIN) 500 MG tablet Take 1 tablet (500 mg total) by mouth every 6 (six) hours as needed for up to 5 days for muscle  spasms (pain). 20 tablet 0   methocarbamol (ROBAXIN) 500 MG tablet Take 1 tablet (500 mg total) by mouth every 6 (six) hours as needed for muscle spasms 30 tablet 1   Misc Natural Products (SAMBUCUS ELDERBERRY IMMUNE) CHEW Chew 2 tablets by mouth in the morning.     Nicotine (NICODERM CQ TD) Place 1 patch onto the skin daily as needed (for smoking cessation- while hospitalized).     olmesartan (BENICAR) 40 MG tablet Take 1 tablet (40 mg total) by mouth daily. 30 tablet 2   pantoprazole (PROTONIX) 40 MG tablet Take 1 tablet (40 mg total) by mouth daily. 30 tablet 2   rivaroxaban (XARELTO) 20 MG TABS tablet Take 1 tablet (20 mg total) by mouth daily with food 30 tablet 11   No current facility-administered medications for this visit.    VITAL SIGNS: There were no vitals taken for this visit. There were no vitals filed for this visit.  Estimated body mass index is 21.64 kg/m as calculated from the following:   Height as of 08/10/23: 5\' 5"  (1.651 m).   Weight as of 08/10/23: 130 lb 1 oz (59 kg).  LABS: CBC:    Component Value Date/Time   WBC 10.4 08/10/2023 1424   HGB 10.9 (L) 08/10/2023 1424   HCT 36.7 08/10/2023 1424   PLT 704 (H) 08/10/2023 1424   MCV 79.8 (L) 08/10/2023 1424   NEUTROABS 7.5 08/10/2023 1424   LYMPHSABS 2.0 08/10/2023 1424   MONOABS 0.7 08/10/2023 1424   EOSABS 0.1 08/10/2023 1424   BASOSABS 0.0 08/10/2023 1424   Comprehensive Metabolic Panel:    Component Value Date/Time   NA 142 08/10/2023 1424   K 3.7 08/10/2023 1424   CL 107 08/10/2023 1424   CO2 28 08/10/2023 1424   BUN 12 08/10/2023 1424   CREATININE 0.76 08/10/2023 1424   GLUCOSE 95 08/10/2023 1424   CALCIUM 9.8 08/10/2023 1424   AST 10 (L) 08/10/2023 1424   ALT 7 08/10/2023 1424   ALKPHOS 92 08/10/2023 1424   BILITOT 0.3 08/10/2023 1424   PROT 6.8 08/10/2023 1424   ALBUMIN 3.9 08/10/2023 1424    RADIOGRAPHIC STUDIES: CT ABDOMEN PELVIS W CONTRAST Result Date: 07/13/2023 CLINICAL DATA:  Postop  abdominal pain. Status post right colectomy for colon carcinoma. * Tracking Code: BO * EXAM: CT ABDOMEN AND PELVIS WITH CONTRAST TECHNIQUE: Multidetector CT imaging of the abdomen and pelvis was performed using the standard protocol following bolus administration of intravenous contrast. RADIATION DOSE REDUCTION: This exam was performed according to the departmental dose-optimization program which includes automated exposure control, adjustment of the mA and/or kV according to patient size and/or use of iterative reconstruction technique. CONTRAST:  OMNIPAQUE IOHEXOL 300 MG/ML  SOLN COMPARISON:  07/01/2023 FINDINGS: Lower Chest: New small bilateral pleural effusions and bilateral lower lobe atelectasis, right side greater than left. Hepatobiliary: A few tiny sub-cm hepatic cysts remain stable. No suspicious hepatic masses identified. Gallbladder is unremarkable. No evidence of biliary  ductal dilatation. Pancreas:  No mass or inflammatory changes. Spleen: Within normal limits in size and appearance. Adrenals/Urinary Tract: Stable bilateral adrenal hyperplasia. No suspicious masses identified. No evidence of ureteral calculi or hydronephrosis. Small amount of gas in urinary bladder is presumably due to recent catheterization. Stomach/Bowel: Postop changes are seen from right hemicolectomy. No mass identified. Mildly dilated small bowel loops with air-fluid levels are seen, consistent with mild ileus. Colonic diverticulosis is again noted, without signs of diverticulitis. Mild ascites is seen, however, no abscess identified. Vascular/Lymphatic: No pathologically enlarged lymph nodes. No acute vascular findings. Reproductive: Tiny less than 1 cm calcified uterine fibroid again noted. Vaginal pessary remains in place. Other:  Moderate diffuse body wall edema. Musculoskeletal:  No suspicious bone lesions identified. IMPRESSION: Postop changes from right hemicolectomy. No evidence of recurrent or metastatic disease.  Mild postop ileus pattern. Mild ascites. No evidence of abscess. Moderate diffuse body wall edema. New small bilateral pleural effusions and bilateral lower lobe atelectasis, right side greater than left. Electronically Signed   By: Danae Orleans M.D.   On: 07/13/2023 16:49   CT CHEST W CONTRAST Result Date: 07/02/2023 CLINICAL DATA:  Colon cancer staging. EXAM: CT CHEST WITH CONTRAST TECHNIQUE: Multidetector CT imaging of the chest was performed during intravenous contrast administration. RADIATION DOSE REDUCTION: This exam was performed according to the departmental dose-optimization program which includes automated exposure control, adjustment of the mA and/or kV according to patient size and/or use of iterative reconstruction technique. CONTRAST:  75mL OMNIPAQUE IOHEXOL 300 MG/ML  SOLN COMPARISON:  CT abdomen and pelvis 06/30/2022 FINDINGS: Cardiovascular: No significant vascular findings. Normal heart size. No pericardial effusion. Mediastinum/Nodes: Hypodense right thyroid nodule measures 11 mm. There are no enlarged mediastinal or hilar lymph nodes identified. Visualized esophagus is within normal limits. Lungs/Pleura: There is patchy airspace consolidation in the bilateral lower lobes, right greater than left. Mild emphysematous changes are present. There is no pleural effusion or pneumothorax. Upper Abdomen: There are 2 rounded hypodensities in the liver favored as cysts, unchanged. Nodular thickening of the adrenal glands again noted. Musculoskeletal: There are healed posterior left rib fractures. No acute fractures are seen. No focal osseous lesions are identified. IMPRESSION: 1. No evidence for metastatic disease in the chest. 2. Patchy airspace consolidation in the bilateral lower lobes, right greater than left, worrisome for pneumonia. 3. Emphysema. Emphysema (ICD10-J43.9). Electronically Signed   By: Darliss Cheney M.D.   On: 07/02/2023 19:19   CT ABDOMEN PELVIS W CONTRAST Result Date:  07/01/2023 CLINICAL DATA:  Right lower quadrant pain for 3 days. Blood in stool. Constipation. * Tracking Code: BO * EXAM: CT ABDOMEN AND PELVIS WITH CONTRAST TECHNIQUE: Multidetector CT imaging of the abdomen and pelvis was performed using the standard protocol following bolus administration of intravenous contrast. RADIATION DOSE REDUCTION: This exam was performed according to the departmental dose-optimization program which includes automated exposure control, adjustment of the mA and/or kV according to patient size and/or use of iterative reconstruction technique. CONTRAST:  ISOVUE-300 IOPAMIDOL (ISOVUE-300) INJECTION 61% COMPARISON:  06/24/2018 FINDINGS: Lower Chest: No acute findings. Hepatobiliary: No suspicious hepatic masses identified. A few tiny hepatic cysts are noted. Gallbladder is unremarkable. No evidence of biliary ductal dilatation. Pancreas:  No mass or inflammatory changes. Spleen: Within normal limits in size and appearance. Adrenals/Urinary Tract: Stable diffuse nodular thickening of both adrenal glands, consistent with nodular hyperplasia. No suspicious renal masses identified. No evidence of ureteral calculi or hydronephrosis. Stomach/Bowel: The right colon is dilated and contains a large  amount of stool. A masslike constricting lesion is seen in the hepatic flexure of the colon measuring approximately 4 cm in length, highly suspicious for obstructing colon carcinoma. Adjacent sub-centimeter pericolonic lymph nodes at this site are seen, suspicious for local lymph node metastases. Mild right colonic wall thickening and adjacent soft tissue stranding also seen, consistent with colitis. No evidence of pneumatosis, free intraperitoneal air, or abscess. Normal appendix visualized. Vascular/Lymphatic: No pathologically enlarged lymph nodes. No acute vascular findings. Reproductive: No mass or other significant abnormality. Pessary noted in vagina. Other:  None. Musculoskeletal:  No  suspicious bone lesions identified. IMPRESSION: Obstructing masslike lesion in hepatic flexure of colon, highly suspicious for colon carcinoma. Dilated ascending colon shows mild wall thickening and adjacent stranding, highly suspicious for colitis. No evidence of perforation or abscess. Adjacent sub-centimeter pericolonic lymph nodes at this site. Local lymph node metastases cannot be excluded. Mild right colonic wall thickening and adjacent soft tissue stranding, consistent with colitis. No evidence of distant metastatic disease. These results will be called to the ordering clinician or representative by the Radiologist Assistant, and communication documented in the PACS or [employer]. Electronically Signed   By: Danae Orleans M.D.   On: 07/01/2023 15:18    PERFORMANCE STATUS (ECOG) : 1 - Symptomatic but completely ambulatory  Review of Systems  Constitutional:  Positive for fatigue.  Gastrointestinal:  Positive for abdominal pain.  Unless otherwise noted, a complete review of systems is negative.  Physical Exam General: NAD Cardiovascular: regular rate and rhythm Pulmonary: normal breathing pattern Extremities: no edema, no joint deformities Skin: no rashes Neurological: Alert and oriented x3  Discussed the use of AI scribe software for clinical note transcription with the patient, who gave verbal consent to proceed.  IMPRESSION: This is my initial visit with Ms. Germano. No acute distress noted. No family present. Patient is ambulatory. Alert and able to engage appropriately in discussions. She was initially seen by our palliative team during recent hospitalization.   I introduced myself, Maygan RN, and Palliative's role in collaboration with the oncology team. Concept of Palliative Care was introduced as specialized medical care for people and their families living with serious illness.  It focuses on providing relief from the symptoms and stress of a serious illness.  The goal is  to improve quality of life for both the patient and the family. Values and goals of care important to patient and family were attempted to be elicited.   Ms. Hritz lives in the home alone. She has 2 children. Her son has been coming over to assist with dogs and other needs since recent diagnosis. She has 3 [place]. 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 She takes great pride in her work and is hopeful she can continue with training despite her health challenges.   The incision site from her surgery is painful, especially at night, and she manages this with Tylenol. She has not experienced constipation or diarrhea and has not been using muscle relaxers frequently. She was prescribed oxycodone upon discharge from the hospital but uses it sparingly, only when the pain is severe. No unmet symptom needs at this time.   Goals of Care We discussed her current illness and what it means in the larger context of her on-going co-morbidities. Natural disease trajectory and expectations were discussed. Jonai is realistic in her understanding of current cancer diagnosis.   Ms. Monterrosa states she is considering chemotherapy as a preventative measure but is  unsure about proceeding due to concerns about side effects such as nausea, vomiting, fatigue, and changes in blood count. Her blood count has been abnormal since her hospitalization, where she received two blood transfusions. Her hemoglobin was 10.9 on March 4th, which is improved from previous levels. She is concerned about how chemotherapy might affect her pulse rate and blood pressure, which have been unstable since her hospital stay.   She is worried about the impact of chemotherapy on her ability to care for her dogs due to potential fatigue. She has three [place] and caring for them is very important to her quality of life.   She shares her experiences with family members with a history of cancer,  her sister-in-law having had breast cancer that later metastasized to the brain and other areas. Her father passed away from cancer, and her husband died from Lewy body dementia. She mentions watching them suffer with symptoms and she has no desire to encounter this if possible. Education provided on aggressive symptom management in addition to changes in medication use over the years to minimize symptom burden in addition to the onsite resources that we offer here at the cancer center. She verbalized understanding and appreciation.   Nyx shares she has been in discussion with her sister and children as she knows she has a complex decision to make. She states she is also interested in considering other alternatives (holistically) versus clinical trials that does not require extensive therapies.  She plans to further discuss this with her oncology team.  I discussed the importance of continued conversation with family and their medical providers regarding overall plan of care and treatment options, ensuring decisions are within the context of the patients values and GOCs.  PLAN: Established therapeutic relationship. Education provided on palliative's role in collaboration with their Oncology/Radiation team.  Goals of Care Patient is hesitant about undergoing preventative chemotherapy due to concerns about side effects, particularly nausea, fatigue, and changes in blood count. Discussed the role of symptom management and the support available from the healthcare team. Patient is considering seeking a second opinion specifically along the lines of holistic approach vs clinical trial to allow her to make the best decision. She is realistic in her understanding and clear in expressed wishes to take things one day at a time while she makes complex decisions as her quality of life is most important to her. -Provide symptom management and close follow-up if patient decides to undergo  chemotherapy.  Post-Surgical Wound Patient reports discomfort at the site of a previous surgical incision, particularly at night. Currently managing pain with Tylenol. -Continue current pain management strategy. -Oxycodone as needed for severe pain.  -Advise patient to contact healthcare team if pain worsens or new symptoms develop.  Follow-up Plan to check in with patient every 6-8 weeks to monitor symptoms and treatment decisions. Patient is encouraged to reach out sooner if any discomfort or new symptoms arise. -Provide patient with contact information for healthcare team. -Plan for follow-up in 6-8 weeks or sooner if needed.  Patient expressed understanding and was in agreement with this plan. She also understands that She can call the clinic at any time with any questions, concerns, or complaints.   Thank you for your referral and allowing Palliative to assist in Mrs. Brayton Mars Grigg's care.   Number and complexity of problems addressed: HIGH - 1 or more chronic illnesses with SEVERE exacerbation, progression, or side effects of treatment - advanced cancer, pain. Any controlled substances utilized were  prescribed in the context of palliative care.   Visit consisted of counseling and education dealing with the complex and emotionally intense issues of symptom management and palliative care in the setting of serious and potentially life-threatening illness.  Signed by: Willette Alma, AGPCNP-BC Palliative Medicine Team/[facility]

## 2023-08-14 DIAGNOSIS — T8189XA Other complications of procedures, not elsewhere classified, initial encounter: Secondary | ICD-10-CM | POA: Diagnosis not present

## 2023-08-14 DIAGNOSIS — S31109A Unspecified open wound of abdominal wall, unspecified quadrant without penetration into peritoneal cavity, initial encounter: Secondary | ICD-10-CM | POA: Diagnosis not present

## 2023-08-16 ENCOUNTER — Encounter: Payer: Self-pay | Admitting: Nurse Practitioner

## 2023-08-16 ENCOUNTER — Inpatient Hospital Stay (HOSPITAL_BASED_OUTPATIENT_CLINIC_OR_DEPARTMENT_OTHER): Payer: PPO | Admitting: Nurse Practitioner

## 2023-08-16 VITALS — BP 167/95 | HR 105 | Temp 97.7°F | Resp 18 | Ht 65.0 in | Wt 132.2 lb

## 2023-08-16 DIAGNOSIS — C189 Malignant neoplasm of colon, unspecified: Secondary | ICD-10-CM | POA: Diagnosis not present

## 2023-08-16 DIAGNOSIS — Z7189 Other specified counseling: Secondary | ICD-10-CM | POA: Diagnosis not present

## 2023-08-16 DIAGNOSIS — Z515 Encounter for palliative care: Secondary | ICD-10-CM

## 2023-08-17 ENCOUNTER — Telehealth: Payer: Self-pay | Admitting: *Deleted

## 2023-08-17 ENCOUNTER — Other Ambulatory Visit: Payer: Self-pay | Admitting: *Deleted

## 2023-08-17 DIAGNOSIS — C189 Malignant neoplasm of colon, unspecified: Secondary | ICD-10-CM

## 2023-08-17 NOTE — Telephone Encounter (Signed)
 Referral to [facility] GI oncology faxed to [phone].

## 2023-08-18 DIAGNOSIS — S31109A Unspecified open wound of abdominal wall, unspecified quadrant without penetration into peritoneal cavity, initial encounter: Secondary | ICD-10-CM | POA: Diagnosis not present

## 2023-08-18 DIAGNOSIS — T8189XA Other complications of procedures, not elsewhere classified, initial encounter: Secondary | ICD-10-CM | POA: Diagnosis not present

## 2023-08-19 ENCOUNTER — Encounter: Payer: Self-pay | Admitting: Hematology and Oncology

## 2023-08-19 DIAGNOSIS — Z4689 Encounter for fitting and adjustment of other specified devices: Secondary | ICD-10-CM | POA: Diagnosis not present

## 2023-08-23 LAB — GUARDANT REVEAL

## 2023-08-31 DIAGNOSIS — C189 Malignant neoplasm of colon, unspecified: Secondary | ICD-10-CM | POA: Diagnosis not present

## 2023-09-03 DIAGNOSIS — C189 Malignant neoplasm of colon, unspecified: Secondary | ICD-10-CM | POA: Diagnosis not present

## 2023-09-09 ENCOUNTER — Other Ambulatory Visit (HOSPITAL_COMMUNITY): Payer: Self-pay

## 2023-09-09 DIAGNOSIS — C189 Malignant neoplasm of colon, unspecified: Secondary | ICD-10-CM | POA: Diagnosis not present

## 2023-09-09 DIAGNOSIS — N182 Chronic kidney disease, stage 2 (mild): Secondary | ICD-10-CM | POA: Diagnosis not present

## 2023-09-09 DIAGNOSIS — D649 Anemia, unspecified: Secondary | ICD-10-CM | POA: Diagnosis not present

## 2023-09-09 DIAGNOSIS — R7303 Prediabetes: Secondary | ICD-10-CM | POA: Diagnosis not present

## 2023-09-09 DIAGNOSIS — I82623 Acute embolism and thrombosis of deep veins of upper extremity, bilateral: Secondary | ICD-10-CM | POA: Diagnosis not present

## 2023-09-09 DIAGNOSIS — R197 Diarrhea, unspecified: Secondary | ICD-10-CM | POA: Diagnosis not present

## 2023-09-09 DIAGNOSIS — I10 Essential (primary) hypertension: Secondary | ICD-10-CM | POA: Diagnosis not present

## 2023-09-09 MED ORDER — OLMESARTAN MEDOXOMIL 20 MG PO TABS
20.0000 mg | ORAL_TABLET | Freq: Every day | ORAL | 3 refills | Status: AC
  Filled 2023-09-09: qty 90, 90d supply, fill #0
  Filled 2024-01-12: qty 90, 90d supply, fill #1

## 2023-09-10 ENCOUNTER — Other Ambulatory Visit (HOSPITAL_COMMUNITY): Payer: Self-pay

## 2023-09-10 ENCOUNTER — Encounter: Payer: Self-pay | Admitting: Family Medicine

## 2023-09-10 ENCOUNTER — Encounter: Payer: Self-pay | Admitting: Hematology and Oncology

## 2023-09-10 ENCOUNTER — Telehealth: Payer: Self-pay | Admitting: *Deleted

## 2023-09-10 ENCOUNTER — Telehealth: Payer: Self-pay | Admitting: Nurse Practitioner

## 2023-09-10 NOTE — Telephone Encounter (Signed)
 Entering note from My Chart per pt  I have forwarded the appt request to our scheduling department- if you do not hear from them by early next week please let me know- thank  you Val RN ===View-only below this line===   ----- Message -----      From:Tina Mccullough "[name]"      Sent:09/10/2023  8:33 AM EDT        ZO:[id number]   Subject:Follow up care  When will the next visit be for follow up care? Do I need to do anything or do you set the next appointments up.  Thank you  [name] [phone]

## 2023-09-10 NOTE — Telephone Encounter (Signed)
 Left the patient a voicemail with scheduled appointment details. Will be mailed a reminder.

## 2023-09-13 ENCOUNTER — Other Ambulatory Visit (HOSPITAL_COMMUNITY): Payer: Self-pay

## 2023-09-13 MED ORDER — METRONIDAZOLE 500 MG PO TABS
500.0000 mg | ORAL_TABLET | Freq: Three times a day (TID) | ORAL | 0 refills | Status: DC
  Filled 2023-09-13: qty 30, 10d supply, fill #0

## 2023-09-14 ENCOUNTER — Telehealth: Payer: Self-pay | Admitting: Hematology and Oncology

## 2023-09-14 NOTE — Telephone Encounter (Signed)
 Spoke with patient confirming upcoming appointment

## 2023-09-16 ENCOUNTER — Inpatient Hospital Stay: Admitting: Hematology and Oncology

## 2023-09-16 VITALS — BP 147/85 | HR 96 | Temp 97.2°F | Resp 17 | Wt 133.7 lb

## 2023-09-16 DIAGNOSIS — Z79899 Other long term (current) drug therapy: Secondary | ICD-10-CM | POA: Diagnosis not present

## 2023-09-16 DIAGNOSIS — M7989 Other specified soft tissue disorders: Secondary | ICD-10-CM | POA: Insufficient documentation

## 2023-09-16 DIAGNOSIS — F17210 Nicotine dependence, cigarettes, uncomplicated: Secondary | ICD-10-CM | POA: Diagnosis not present

## 2023-09-16 DIAGNOSIS — I10 Essential (primary) hypertension: Secondary | ICD-10-CM | POA: Diagnosis not present

## 2023-09-16 DIAGNOSIS — R197 Diarrhea, unspecified: Secondary | ICD-10-CM | POA: Insufficient documentation

## 2023-09-16 DIAGNOSIS — I82622 Acute embolism and thrombosis of deep veins of left upper extremity: Secondary | ICD-10-CM | POA: Insufficient documentation

## 2023-09-16 DIAGNOSIS — Z85038 Personal history of other malignant neoplasm of large intestine: Secondary | ICD-10-CM | POA: Diagnosis not present

## 2023-09-16 DIAGNOSIS — Z9049 Acquired absence of other specified parts of digestive tract: Secondary | ICD-10-CM | POA: Insufficient documentation

## 2023-09-16 DIAGNOSIS — Z7901 Long term (current) use of anticoagulants: Secondary | ICD-10-CM | POA: Insufficient documentation

## 2023-09-16 DIAGNOSIS — C189 Malignant neoplasm of colon, unspecified: Secondary | ICD-10-CM

## 2023-09-16 NOTE — Progress Notes (Signed)
 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 Uncertainty regarding tumor invasion into the omentum. Discussed the benefits and risks of adjuvant chemotherapy. She was presented in our [facility] and the recommendation was to consider chemotherapy, Guardant reveal with no detectable disease. She was seen at [facility] for second opinion, given MRD neg, they felt its reasonable to omit chemotherapy based on DYNAMIC trial data. Assessment & Plan Colon cancer - continue surveillance alone - Schedule colonoscopy in January 2026. - Order scans for July 2025. - Perform blood work in July 2025.  Blood clot in arm On Xarelto 20 mg since late January. Plan to continue until end of April 2025. - Continue Xarelto until the end of April 2025.  Leg swelling Bilateral leg swelling post-surgery, symmetrical, no pain. Advised monitoring for worsening or new symptoms. - Monitor leg swelling and report any worsening or new symptoms.  Hypertension On amlodipine and olmesartan. Olmesartan dosage reduced to 20 mg as per primary care provider. - Continue amlodipine and olmesartan as prescribed.  FU in July     Orders Placed This Encounter  Procedures   CT CHEST ABDOMEN PELVIS W CONTRAST    Standing Status:   Future    Expected Date:   12/30/2023    Expiration Date:   09/15/2024    If indicated for the ordered procedure, I authorize the administration of contrast media per Radiology protocol:   Yes    Does the patient have a contrast media/X-ray dye allergy?:   No    Preferred  imaging location?:   [facility]    If indicated for the ordered procedure, I authorize the administration of oral contrast media per Radiology protocol:   Yes   CBC with Differential/Platelet    Standing Status:   Future    Expected Date:   12/16/2023    Expiration Date:   09/15/2024   CMP ([facility] only)    Standing Status:   Future    Expected Date:   12/16/2023    Expiration Date:   09/15/2024   [facility] ([facility] ONLY    Standing Status:   Future    Expected Date:   12/16/2023    Expiration Date:   09/15/2024     HISTORY OF PRESENTING ILLNESS:  Tina [facility] 73 y.o. female is here because of Adenocarcinoma colon  Tina [facility]cc[facility]ough 73 y.o. female this is a very pleasant female patient who went to [facility] on 07/01/2023 due to weakness, abdominal pain, and bloody stools x 4 days.  Patient saw her primary physician for complaints as noted and was sent to have an outpatient CT scan.  That scan showed a mass with colitis.  Also was noted a drop in hemoglobin, which was concerning for colon cancer.   Workup in the ED included CT scans which showed obstructing masslike lesion in hepatic flexure of colon highly suspicious for colon carcinoma. Status post colonoscopy on 07/04/2023 which also showed large obstructing and infiltrative mass in the proximal transverse colon.  Pathology from biopsy of transverse colon showed adenocarcinoma, superficial fragments.  No submucosa present to evaluate for submucosal invasion.  She had right  colon resection which showed invasive moderately differentiated adenocarcinoma extending through submucosa muscularis propria and into subserosal subserosal adipose tissue, negative margins, unremarkable appendix.  Mismatch repair protein interpretation was abnormal with loss of major and minor MMRMLH1 and PMS2 Given the presence of some high risk features such as possible imminent perforation as well as presentation of obstruction as well as  adherent omentum, we have presented her in the tumor board to discuss role of chemotherapy. In GI TB, recommendation was to consider adj chemotherapy. MRD neg BRAF mutated.  Discussed the use of AI scribe software for clinical note transcription with the patient, who gave verbal consent to proceed.   History of Present Illness Tina [facility] "Tina Mccullough" is a 73 year old female with colon cancer who presents for follow-up after recent tests and surgery.  She is currently under surveillance for colon cancer without active treatment following favorable test results from [facility]. The pathology report was reviewed and agreed upon by the team at [facility].  She experiences significant swelling in her lower legs, which began post-surgery. Initially, the swelling resolved but has since recurred. It is symmetrical, primarily affecting her ankles. No heart issues are reported, although her pulse rate was elevated post-surgery. No pain is associated with the swelling, and she has not experienced chest pain or shortness of breath.  She continues to experience persistent diarrhea. She was prescribed Flagyl to manage this symptom.  Regarding her medications, she is taking amlodipine and olmesartan for blood pressure management, with a recent reduction in the olmesartan dose to 20 mg. She is also on Xarelto 20 mg for a blood clot in her arm, which she started at the end of January and plans to continue until the end of April. She takes folic acid and uses a nicotine patch. She has discontinued iron and Protonix, which were prescribed during her hospital stay.   No other pertinent ROS noted.  MEDICAL HISTORY:  Past Medical History:  Diagnosis Date   Anxiety    Depression    DVT of axillary vein, acute left (HCC)    left subclavian vein   Hypertension    PONV (postoperative nausea and vomiting)    Pulmonary embolus (HCC)     SURGICAL HISTORY: Past Surgical History:  Procedure Laterality  Date   ARTHROSCOPIC REPAIR ACL Left    BIOPSY  07/04/2023   Procedure: BIOPSY;  Surgeon: Kathi Der, MD;  Location: [facility];  Service: Gastroenterology;;   CESAREAN SECTION     COLONOSCOPY WITH PROPOFOL N/A 07/04/2023   Procedure: COLONOSCOPY WITH PROPOFOL;  Surgeon: Kathi Der, MD;  Location: [facility];  Service: Gastroenterology;  Laterality: N/A;   EXCISION ORAL TUMOR Left 11/28/2014   Procedure: SURGICAL REMOVAL OF ODOTOGENIC  TUMOR AND IMPACTED TOOTH NUMBER 17;  Surgeon: Felton Clinton, DDS;  Location: [facility];  Service: Oral Surgery;  Laterality: Left;   HAND SURGERY Bilateral    for trigger fingers   PARTIAL COLECTOMY Right 07/06/2023   Procedure: PARTIAL COLECTOMY;  Surgeon: Berna Bue, MD;  Location: [facility];  Service: General;  Laterality: Right;   SUBMUCOSAL TATTOO INJECTION  07/04/2023   Procedure: SUBMUCOSAL TATTOO INJECTION;  Surgeon: Kathi Der, MD;  Location: [facility];  Service: Gastroenterology;;    SOCIAL HISTORY: Social History   Socioeconomic History   Marital status: Widowed    Spouse name: Not on file   Number of children: Not on file   Years of education: Not on file   Highest  education level: Not on file  Occupational History   Not on file  Tobacco Use   Smoking status: Every Day    Current packs/day: 1.00    Average packs/day: 1 pack/day for 37.0 years (37.0 ttl pk-yrs)    Types: E-cigarettes, Cigarettes   Smokeless tobacco: Never   Tobacco comments:    Counseled on reduce to quit. Information on classes shared  Vaping Use   Vaping status: Never Used  Substance and Sexual Activity   Alcohol use: No   Drug use: No   Sexual activity: Not on file  Other Topics Concern   Not on file  Social History Narrative   Not on file   Social Drivers of Health   Financial Resource Strain: Low Risk  (08/19/2023)   Received from [facility]   Overall Financial Resource Strain (CARDIA)    Difficulty of  Paying Living Expenses: Not very hard  Food Insecurity: No Food Insecurity (08/19/2023)   Received from [facility]   Hunger Vital Sign    Worried About Running Out of Food in the Last Year: Never true    Ran Out of Food in the Last Year: Never true  Transportation Needs: No Transportation Needs (08/19/2023)   Received from [facility]   PRAPARE - Transportation    Lack of Transportation (Medical): No    Lack of Transportation (Non-Medical): No  Physical Activity: Not on file  Stress: Not on file  Social Connections: Moderately Integrated (07/03/2023)   Social Connection and Isolation Panel [NHANES]    Frequency of Communication with Friends and Family: Three times a week    Frequency of Social Gatherings with Friends and Family: Twice a week    Attends Religious Services: 1 to 4 times per year    Active Member of [employer] or Organizations: No    Attends Banker Meetings: Never    Marital Status: Married  Recent Concern: Social Connections - Socially Isolated (07/03/2023)   Social Connection and Isolation Panel [NHANES]    Frequency of Communication with Friends and Family: Never    Frequency of Social Gatherings with Friends and Family: Never    Attends Religious Services: Never    Database administrator or Organizations: Not on file    Attends Banker Meetings: Never    Marital Status: Married  Catering manager Violence: Not At Risk (07/02/2023)   Humiliation, Afraid, Rape, and Kick questionnaire    Fear of Current or Ex-Partner: No    Emotionally Abused: No    Physically Abused: No    Sexually Abused: No    FAMILY HISTORY: No family history on file.  ALLERGIES:  is allergic to beef-derived drug products, dog epithelium (canis lupus familiaris), pork-derived products, bee venom, diclofenac, and lisinopril.  MEDICATIONS:  Current Outpatient Medications  Medication Sig Dispense Refill   acetaminophen (TYLENOL) 500 MG tablet Take 2 tablets (1,000  mg total) by mouth every 6 (six) hours as needed.     amLODipine (NORVASC) 5 MG tablet Take 1 tablet (5 mg total) by mouth daily. 30 tablet 2   ascorbic acid (VITAMIN C) 500 MG tablet Take 1,500 mg by mouth in the morning.     Cholecalciferol (VITAMIN D-3) 125 MCG (5000 UT) TABS Take 10,000 Units by mouth daily.     diphenhydrAMINE (BENADRYL) 25 mg capsule Take 25 mg by mouth daily as needed (ONLY FOR ALLERGIC SYMPTOMS).     EPINEPHrine (EPIPEN) 0.3 mg/0.3 mL DEVI Inject 0.3  mLs (0.3 mg total) into the muscle as needed. (Patient taking differently: Inject 0.3 mg into the muscle as needed (allergic reaction).) 2 Device 1   folic acid (FOLVITE) 1 MG tablet Take 1 tablet (1 mg total) by mouth daily. 30 tablet 2   metroNIDAZOLE (FLAGYL) 500 MG tablet Take 1 tablet (500 mg total) by mouth 3 (three) times daily for 10 days. 30 tablet 0   Misc Natural Products (SAMBUCUS ELDERBERRY IMMUNE) CHEW Chew 2 tablets by mouth in the morning.     Nicotine (NICODERM CQ TD) Place 1 patch onto the skin daily as needed (for smoking cessation- while hospitalized).     olmesartan (BENICAR) 20 MG tablet Take 1 tablet (20 mg total) by mouth daily. 90 tablet 3   pantoprazole (PROTONIX) 40 MG tablet Take 1 tablet (40 mg total) by mouth daily. 30 tablet 2   rivaroxaban (XARELTO) 20 MG TABS tablet Take 1 tablet (20 mg total) by mouth daily with food 30 tablet 11   No current facility-administered medications for this visit.     PHYSICAL EXAMINATION: ECOG PERFORMANCE STATUS: 0 - Asymptomatic  Vitals:   09/16/23 1415  BP: (!) 147/85  Pulse: 96  Resp: 17  Temp: (!) 97.2 F (36.2 C)  SpO2: 99%    Filed Weights   09/16/23 1415  Weight: 133 lb 11.2 oz (60.6 kg)     GENERAL:alert, no distress and comfortable   LABORATORY DATA:  I have reviewed the data as listed Lab Results  Component Value Date   WBC 10.4 08/10/2023   HGB 10.9 (L) 08/10/2023   HCT 36.7 08/10/2023   MCV 79.8 (L) 08/10/2023   PLT 704 (H)  08/10/2023     Chemistry      Component Value Date/Time   NA 142 08/10/2023 1424   K 3.7 08/10/2023 1424   CL 107 08/10/2023 1424   CO2 28 08/10/2023 1424   BUN 12 08/10/2023 1424   CREATININE 0.76 08/10/2023 1424      Component Value Date/Time   CALCIUM 9.8 08/10/2023 1424   ALKPHOS 92 08/10/2023 1424   AST 10 (L) 08/10/2023 1424   ALT 7 08/10/2023 1424   BILITOT 0.3 08/10/2023 1424       RADIOGRAPHIC STUDIES: I have personally reviewed the radiological images as listed and agreed with the findings in the report. No results found.   All questions were answered. The patient knows to call the clinic with any problems, questions or concerns. I spent 30 minutes in the care of this patient including H and P, review of records, counseling and coordination of care.     Rachel Moulds, MD 09/16/2023 4:39 PM

## 2023-09-16 NOTE — Assessment & Plan Note (Addendum)
 Colon Cancer (Stage II, T3N0) Post-surgical patient with a history of obstruction and perforation. Uncertainty regarding tumor invasion into the omentum. Discussed the benefits and risks of adjuvant chemotherapy. She was presented in our [facility] and the recommendation was to consider chemotherapy, Guardant reveal with no detectable disease. She was seen at [facility] for second opinion, given MRD neg, they felt its reasonable to omit chemotherapy based on DYNAMIC trial data. Assessment & Plan Colon cancer - continue surveillance alone - Schedule colonoscopy in January 2026. - Order scans for July 2025. - Perform blood work in July 2025.  Blood clot in arm On Xarelto 20 mg since late January. Plan to continue until end of April 2025. - Continue Xarelto until the end of April 2025.  Leg swelling Bilateral leg swelling post-surgery, symmetrical, no pain. Advised monitoring for worsening or new symptoms. - Monitor leg swelling and report any worsening or new symptoms.  Hypertension On amlodipine and olmesartan. Olmesartan dosage reduced to 20 mg as per primary care provider. - Continue amlodipine and olmesartan as prescribed.  FU in July

## 2023-09-27 ENCOUNTER — Inpatient Hospital Stay (HOSPITAL_BASED_OUTPATIENT_CLINIC_OR_DEPARTMENT_OTHER): Admitting: Nurse Practitioner

## 2023-09-27 VITALS — BP 135/84 | HR 91 | Temp 98.4°F | Resp 16 | Ht 65.0 in | Wt 134.6 lb

## 2023-09-27 DIAGNOSIS — C189 Malignant neoplasm of colon, unspecified: Secondary | ICD-10-CM | POA: Diagnosis not present

## 2023-09-27 DIAGNOSIS — R530 Neoplastic (malignant) related fatigue: Secondary | ICD-10-CM | POA: Diagnosis not present

## 2023-09-27 DIAGNOSIS — Z515 Encounter for palliative care: Secondary | ICD-10-CM

## 2023-09-28 ENCOUNTER — Encounter: Payer: Self-pay | Admitting: Nurse Practitioner

## 2023-09-28 NOTE — Progress Notes (Signed)
 Palliative Medicine [facility]  [phone] [phone]   Name: Tina [facility] Date: 09/28/2023 MRN: [id number]  DOB: 10/27/1950  Patient Care Team: Glena Landau, MD as PCP - General (Family Medicine) Devon Fogo, MD (Inactive) as Consulting Physician (Dermatology) Pickenpack-Cousar, Giles Labrum, NP as Nurse Practitioner (Hospice and Palliative Medicine)    INTERVAL HISTORY: Tina Mcc[facility] is a 73 y.o. 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 Palliative ask to see for symptom management and goals of care.   SOCIAL HISTORY:     reports that she has been smoking e-cigarettes and cigarettes. She has a 37 pack-year smoking history. She has never used smokeless tobacco. She reports that she does not drink alcohol and does not use drugs.  ADVANCE DIRECTIVES:  None on file   CODE STATUS: Full code  PAST MEDICAL HISTORY: Past Medical History:  Diagnosis Date   Anxiety    Depression    DVT of axillary vein, acute left (HCC)    left subclavian vein   Hypertension    PONV (postoperative nausea and vomiting)    Pulmonary embolus (HCC)     ALLERGIES:  is allergic to beef-derived drug products, dog epithelium (canis lupus familiaris), pork-derived products, bee venom, diclofenac, and lisinopril.  MEDICATIONS:  Current Outpatient Medications  Medication Sig Dispense Refill   acetaminophen  (TYLENOL ) 500 MG tablet Take 2 tablets (1,000 mg total) by mouth every 6 (six) hours as needed.     amLODipine  (NORVASC ) 5 MG tablet Take 1 tablet (5 mg total) by mouth daily. 30 tablet 2   ascorbic acid  (VITAMIN C ) 500 MG tablet Take 1,500 mg by mouth in the morning.     Cholecalciferol (VITAMIN D-3) 125 MCG (5000 UT) TABS Take 10,000 Units by mouth daily.     diphenhydrAMINE (BENADRYL) 25 mg capsule Take 25 mg by mouth daily as needed (ONLY FOR  ALLERGIC SYMPTOMS).     EPINEPHrine  (EPIPEN ) 0.3 mg/0.3 mL DEVI Inject 0.3 mLs (0.3 mg total) into the muscle as needed. (Patient taking differently: Inject 0.3 mg into the muscle as needed (allergic reaction).) 2 Device 1   folic acid  (FOLVITE ) 1 MG tablet Take 1 tablet (1 mg total) by mouth daily. 30 tablet 2   metroNIDAZOLE  (FLAGYL ) 500 MG tablet Take 1 tablet (500 mg total) by mouth 3 (three) times daily for 10 days. 30 tablet 0   Misc Natural Products (SAMBUCUS ELDERBERRY IMMUNE) CHEW Chew 2 tablets by mouth in the morning.     Nicotine  (NICODERM CQ  TD) Place 1 patch onto the skin daily as needed (for smoking cessation- while hospitalized).     olmesartan  (BENICAR ) 20 MG tablet Take 1 tablet (20 mg total) by mouth daily. 90 tablet 3   pantoprazole  (PROTONIX ) 40 MG tablet Take 1 tablet (40 mg total) by mouth daily. 30 tablet 2   rivaroxaban  (XARELTO ) 20 MG TABS tablet Take 1 tablet (20 mg total) by mouth daily with food 30 tablet 11   No current facility-administered medications for this visit.    VITAL SIGNS: BP 135/84 (BP Location: Left Arm, Patient Position: Sitting)   Pulse 91   Temp 98.4 F (36.9 C) (Temporal)   Resp 16   Ht 5\' 5"  (1.651 m)   Wt 134 lb 9.6 oz (61.1 kg)   SpO2 98%   BMI 22.40 kg/m  Filed Weights   09/27/23 1313  Weight: 134 lb  9.6 oz (61.1 kg)    Estimated body mass index is 22.4 kg/m as calculated from the following:   Height as of this encounter: 5\' 5"  (1.651 m).   Weight as of this encounter: 134 lb 9.6 oz (61.1 kg).   PERFORMANCE STATUS (ECOG) : 1 - Symptomatic but completely ambulatory  Physical Exam General: NAD Cardiovascular: regular rate and rhythm Pulmonary: normal breathing pattern Extremities: no edema, left knee tender and swollen Skin: no rashes Neurological: AAO x3  IMPRESSION: Discussed the use of AI scribe software for clinical note transcription with the patient, who gave verbal consent to proceed.  History of Present  Illness Tina [facility] "Tina Mccullough" is a 73 year old female with colon cancer who presents for follow-up. No acute distress. No family present. She is doing well overall. Denies concerns with nausea, vomiting, constipation, or diarrhea. She mentions being less active than before but has started working with her dogs again, which she enjoys.  [name], has significant knee swelling and pain, which worsened after resuming walking post-hospitalization. There is no recent injury to the knee. The swelling is persistent and noticeable. She manages it with ice and Tylenol , as she cannot take NSAIDs due to her medical history. Prior to her recent hospitalization, there was a discussion about potential knee surgery, but she is uncertain if this is the right time for it. She had a knee injection before her hospital stay, which did not provide significant relief. We discussed continuing with ice and Tylenol  as needed. Recommended further follow-up with ortho to determine if other interventions may be needed. Education provided on use of topical creams such as Voltaren or BioFreeze for additional support and pain management.   We will continue to support and follow as needed.   I discussed the importance of continued conversation with family and their medical providers regarding overall plan of care and treatment options, ensuring decisions are within the context of the patients values and GOCs. Assessment & Plan Left Knee swelling Swelling worsened post-ambulation. Oral anti-inflammatories contraindicated. Previous injection ineffective. Ice and rest provide relief. - Refer to Orthopedic specialist for evaluation. - Recommend continued use of ice and rest. - Consider topical NSAID cream like Voltaren.  Diarrhea Diarrhea developed after starting metronidazole . - Monitor symptoms and follow up if persistent  Goals of Care She prioritizes quality of life over quantity, focusing on maintaining current  activity and enjoyment. - Prioritize quality of life in care decisions.  Patient expressed understanding and was in agreement with this plan. She also understands that She can call the clinic at any time with any questions, concerns, or complaints.   Any controlled substances utilized were prescribed in the context of palliative care. PDMP has been reviewed.   Visit consisted of counseling and education dealing with the complex and emotionally intense issues of symptom management and palliative care in the setting of serious and potentially life-threatening illness.  Dellia Ferguson, AGPCNP-BC  Palliative Medicine Team/[facility]

## 2023-10-07 DIAGNOSIS — M1712 Unilateral primary osteoarthritis, left knee: Secondary | ICD-10-CM | POA: Diagnosis not present

## 2023-10-11 ENCOUNTER — Other Ambulatory Visit (HOSPITAL_COMMUNITY): Payer: Self-pay

## 2023-10-11 ENCOUNTER — Other Ambulatory Visit: Payer: Self-pay

## 2023-10-11 MED ORDER — HYDROCODONE-ACETAMINOPHEN 5-325 MG PO TABS
1.0000 | ORAL_TABLET | ORAL | 0 refills | Status: DC | PRN
  Filled 2023-10-11: qty 30, 5d supply, fill #0

## 2023-11-08 ENCOUNTER — Other Ambulatory Visit (HOSPITAL_COMMUNITY): Payer: Self-pay

## 2023-11-09 ENCOUNTER — Other Ambulatory Visit (HOSPITAL_COMMUNITY): Payer: Self-pay

## 2023-11-09 MED ORDER — HYDROCODONE-ACETAMINOPHEN 5-325 MG PO TABS
1.0000 | ORAL_TABLET | ORAL | 0 refills | Status: DC
  Filled 2023-11-09: qty 30, 5d supply, fill #0

## 2023-11-19 ENCOUNTER — Other Ambulatory Visit (HOSPITAL_COMMUNITY): Payer: Self-pay

## 2023-11-19 DIAGNOSIS — Z4689 Encounter for fitting and adjustment of other specified devices: Secondary | ICD-10-CM | POA: Diagnosis not present

## 2023-12-06 DIAGNOSIS — M1712 Unilateral primary osteoarthritis, left knee: Secondary | ICD-10-CM | POA: Diagnosis not present

## 2023-12-06 DIAGNOSIS — M7122 Synovial cyst of popliteal space [Baker], left knee: Secondary | ICD-10-CM | POA: Diagnosis not present

## 2023-12-09 ENCOUNTER — Inpatient Hospital Stay

## 2023-12-09 ENCOUNTER — Inpatient Hospital Stay: Admitting: Hematology and Oncology

## 2023-12-20 ENCOUNTER — Inpatient Hospital Stay

## 2023-12-20 DIAGNOSIS — Z86718 Personal history of other venous thrombosis and embolism: Secondary | ICD-10-CM | POA: Diagnosis not present

## 2023-12-20 DIAGNOSIS — F17210 Nicotine dependence, cigarettes, uncomplicated: Secondary | ICD-10-CM | POA: Diagnosis not present

## 2023-12-20 DIAGNOSIS — C189 Malignant neoplasm of colon, unspecified: Secondary | ICD-10-CM

## 2023-12-20 DIAGNOSIS — I10 Essential (primary) hypertension: Secondary | ICD-10-CM | POA: Insufficient documentation

## 2023-12-20 DIAGNOSIS — Z85038 Personal history of other malignant neoplasm of large intestine: Secondary | ICD-10-CM | POA: Insufficient documentation

## 2023-12-20 DIAGNOSIS — K219 Gastro-esophageal reflux disease without esophagitis: Secondary | ICD-10-CM | POA: Insufficient documentation

## 2023-12-20 LAB — CBC WITH DIFFERENTIAL/PLATELET
Abs Immature Granulocytes: 0.02 K/uL (ref 0.00–0.07)
Basophils Absolute: 0.1 K/uL (ref 0.0–0.1)
Basophils Relative: 1 %
Eosinophils Absolute: 0.2 K/uL (ref 0.0–0.5)
Eosinophils Relative: 2 %
HCT: 39.1 % (ref 36.0–46.0)
Hemoglobin: 13.1 g/dL (ref 12.0–15.0)
Immature Granulocytes: 0 %
Lymphocytes Relative: 25 %
Lymphs Abs: 2.4 K/uL (ref 0.7–4.0)
MCH: 27.3 pg (ref 26.0–34.0)
MCHC: 33.5 g/dL (ref 30.0–36.0)
MCV: 81.6 fL (ref 80.0–100.0)
Monocytes Absolute: 0.7 K/uL (ref 0.1–1.0)
Monocytes Relative: 7 %
Neutro Abs: 6.5 K/uL (ref 1.7–7.7)
Neutrophils Relative %: 65 %
Platelets: 453 K/uL — ABNORMAL HIGH (ref 150–400)
RBC: 4.79 MIL/uL (ref 3.87–5.11)
RDW: 17.1 % — ABNORMAL HIGH (ref 11.5–15.5)
WBC: 9.9 K/uL (ref 4.0–10.5)
nRBC: 0 % (ref 0.0–0.2)

## 2023-12-20 LAB — CMP (CANCER CENTER ONLY)
ALT: 11 U/L (ref 0–44)
AST: 12 U/L — ABNORMAL LOW (ref 15–41)
Albumin: 3.8 g/dL (ref 3.5–5.0)
Alkaline Phosphatase: 70 U/L (ref 38–126)
Anion gap: 4 — ABNORMAL LOW (ref 5–15)
BUN: 18 mg/dL (ref 8–23)
CO2: 27 mmol/L (ref 22–32)
Calcium: 9.6 mg/dL (ref 8.9–10.3)
Chloride: 108 mmol/L (ref 98–111)
Creatinine: 0.89 mg/dL (ref 0.44–1.00)
GFR, Estimated: >60 mL/min (ref >60)
Glucose, Bld: 101 mg/dL — ABNORMAL HIGH (ref 70–99)
Potassium: 4.9 mmol/L (ref 3.5–5.1)
Sodium: 139 mmol/L (ref 135–145)
Total Bilirubin: 0.3 mg/dL (ref 0.0–1.2)
Total Protein: 6.4 g/dL — ABNORMAL LOW (ref 6.5–8.1)

## 2023-12-20 LAB — CEA (ACCESS): CEA (CHCC): 3.96 ng/mL (ref 0.00–5.00)

## 2023-12-21 ENCOUNTER — Other Ambulatory Visit (HOSPITAL_COMMUNITY): Payer: Self-pay

## 2023-12-29 ENCOUNTER — Encounter: Payer: Self-pay | Admitting: Nurse Practitioner

## 2023-12-29 ENCOUNTER — Inpatient Hospital Stay (HOSPITAL_BASED_OUTPATIENT_CLINIC_OR_DEPARTMENT_OTHER): Admitting: Nurse Practitioner

## 2023-12-29 DIAGNOSIS — Z7189 Other specified counseling: Secondary | ICD-10-CM | POA: Diagnosis not present

## 2023-12-29 DIAGNOSIS — C189 Malignant neoplasm of colon, unspecified: Secondary | ICD-10-CM

## 2023-12-29 DIAGNOSIS — Z515 Encounter for palliative care: Secondary | ICD-10-CM

## 2023-12-29 NOTE — Progress Notes (Signed)
 Palliative Medicine [facility]  Telephone:([phone] Fax:(336) [phone]   Name: Tina Mccullough Date: 12/29/2023 MRN: [id number]  DOB: [birth year]  Patient Care Team: Loreli Kins, MD as PCP - General (Family Medicine) Livingston Rigg, MD (Inactive) as Consulting Physician (Dermatology) Pickenpack-Cousar, Fannie SAILOR, NP as Nurse Practitioner (Hospice and Palliative Medicine)    INTERVAL HISTORY: Tina Mccullough is a 73 y.o. 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 Palliative ask to see for symptom management and goals of care.   SOCIAL HISTORY:     reports that she has been smoking e-cigarettes and cigarettes. She has a 37 pack-year smoking history. She has never used smokeless tobacco. She reports that she does not drink alcohol and does not use drugs.  ADVANCE DIRECTIVES:  None on file   CODE STATUS: Full code  PAST MEDICAL HISTORY: Past Medical History:  Diagnosis Date   Anxiety    Depression    DVT of axillary vein, acute left (HCC)    left subclavian vein   Hypertension    PONV (postoperative nausea and vomiting)    Pulmonary embolus (HCC)     ALLERGIES:  is allergic to beef-derived drug products, dog epithelium (canis lupus familiaris), pork-derived products, bee venom, diclofenac, and lisinopril.  MEDICATIONS:  Current Outpatient Medications  Medication Sig Dispense Refill   acetaminophen  (TYLENOL ) 500 MG tablet Take 2 tablets (1,000 mg total) by mouth every 6 (six) hours as needed.     amLODipine  (NORVASC ) 5 MG tablet Take 1 tablet (5 mg total) by mouth daily. 30 tablet 2   ascorbic acid  (VITAMIN C ) 500 MG tablet Take 1,500 mg by mouth in the morning.     Cholecalciferol (VITAMIN D-3) 125 MCG (5000 UT) TABS Take 10,000 Units by mouth daily.     diphenhydrAMINE (BENADRYL) 25 mg capsule Take 25 mg by mouth daily as needed (ONLY FOR  ALLERGIC SYMPTOMS).     EPINEPHrine  (EPIPEN ) 0.3 mg/0.3 mL DEVI Inject 0.3 mLs (0.3 mg total) into the muscle as needed. (Patient taking differently: Inject 0.3 mg into the muscle as needed (allergic reaction).) 2 Device 1   folic acid  (FOLVITE ) 1 MG tablet Take 1 tablet (1 mg total) by mouth daily. 30 tablet 2   HYDROcodone -acetaminophen  (NORCO/VICODIN) 5-325 MG tablet Take 1 tablet by mouth every 4 to 6 hours as needed for pain. 30 tablet 0   metroNIDAZOLE  (FLAGYL ) 500 MG tablet Take 1 tablet (500 mg total) by mouth 3 (three) times daily for 10 days. 30 tablet 0   Misc Natural Products (SAMBUCUS ELDERBERRY IMMUNE) CHEW Chew 2 tablets by mouth in the morning.     Nicotine  (NICODERM CQ  TD) Place 1 patch onto the skin daily as needed (for smoking cessation- while hospitalized).     olmesartan  (BENICAR ) 20 MG tablet Take 1 tablet (20 mg total) by mouth daily. 90 tablet 3   pantoprazole  (PROTONIX ) 40 MG tablet Take 1 tablet (40 mg total) by mouth daily. 30 tablet 2   rivaroxaban  (XARELTO ) 20 MG TABS tablet Take 1 tablet (20 mg total) by mouth daily with food 30 tablet 11   No current facility-administered medications for this visit.    VITAL SIGNS: There were no vitals taken for this visit. There were no vitals filed for this visit.   Estimated body mass index is 22.4 kg/m as calculated from the following:   Height as of 09/27/23: 5'  5 (1.651 m).   Weight as of 09/27/23: 134 lb 9.6 oz (61.1 kg).   PERFORMANCE STATUS (ECOG) : 1 - Symptomatic but completely ambulatory  Physical Exam General: NAD Cardiovascular: regular rate and rhythm Pulmonary: normal breathing pattern Extremities: no edema, left knee tender and swollen Skin: no rashes Neurological: AAO x3  IMPRESSION: Discussed the use of AI scribe software for clinical note transcription with the patient, who gave verbal consent to proceed.  History of Present Illness Tina Mccullough is a 73 year old female with colon  cancer who I connected with by phone for follow-up. No acute distress. She is doing well overall. Denies concerns with nausea, vomiting, or constipation.   She is experiencing changes in her bowel movements, which have become loose and urgent. Initially, her bowel movements were normal, but they became extremely loose after a hospital stay. She was prescribed Flagyl , which helped temporarily, and she also used Imodium. However, when not on medication, the urgency and looseness return. No constipation is reported. She is in the process of scheduling follow-up with GI team.   She has a history of colon cancer and has undergone multiple CT scans with dye and radiation, which is concerning due to her stage 2 kidney failure. She has decided against chemotherapy, citing personal experiences and observations of others who have had poor outcomes with it. The patient reports that, based on her second opinion at [facility], her understanding was that the doctors felt like surveillance was also reasonable.   No pain is reported. She has recently started lifting weights again, which has improved her overall well-being. Her weight has remained stable despite the changes in her bowel habits.  I discussed the importance of continued conversation with family and their medical providers regarding overall plan of care and treatment options, ensuring decisions are within the context of the patients values and GOCs. Assessment & Plan  Goals of Care She prioritizes quality of life over quantity, focusing on maintaining current activity and enjoyment. - Prioritize quality of life in care decisions.  Colon cancer Colon cancer with a history of multiple CT scans, including those with contrast, which are not ideal due to stage 2 chronic kidney disease. She has decided against chemotherapy, prioritizing quality of life over aggressive treatment. A second opinion from [facility] confirmed the initial assessment that the  cancer was fully excised. She is not interested in further CT scans as they would not alter her treatment plan. - Does not wish pursue chemotherapy. - Do not perform further CT scans at this time per patient wishes. Encouraged to discuss with oncology.  - Discuss alternative treatments if desired per Oncology.   Diarrhea Intermittent diarrhea with urgency, previously treated with Flagyl  and Imodium. Symptoms improve with medication but recur when not on treatment. She is scheduled to make an appointment with a GI specialist for further evaluation and management. - Call GI specialist to schedule an appointment for further evaluation of diarrhea. - Continue to use Imodium as needed for symptom control.  Stage 2 chronic kidney disease Stage 2 chronic kidney disease, which is a consideration in avoiding further CT scans with contrast due to potential nephrotoxicity.  I will follow-up with patient in 6-8 weeks. Sooner if needed.   Patient expressed understanding and was in agreement with this plan. She also understands that She can call the clinic at any time with any questions, concerns, or complaints.   Any controlled substances utilized were prescribed in the context of  palliative care. PDMP has been reviewed.   Visit consisted of counseling and education dealing with the complex and emotionally intense issues of symptom management and palliative care in the setting of serious and potentially life-threatening illness.  Levon Borer, AGPCNP-BC  Palliative Medicine Team/[facility]

## 2024-01-07 DIAGNOSIS — M25562 Pain in left knee: Secondary | ICD-10-CM | POA: Diagnosis not present

## 2024-01-10 ENCOUNTER — Other Ambulatory Visit (HOSPITAL_COMMUNITY): Payer: Self-pay

## 2024-01-10 MED ORDER — AMLODIPINE BESYLATE 5 MG PO TABS
5.0000 mg | ORAL_TABLET | Freq: Every day | ORAL | 0 refills | Status: DC
  Filled 2024-01-10: qty 100, 100d supply, fill #0

## 2024-01-12 ENCOUNTER — Inpatient Hospital Stay: Admitting: Hematology and Oncology

## 2024-01-12 ENCOUNTER — Ambulatory Visit: Admitting: Hematology and Oncology

## 2024-01-12 ENCOUNTER — Other Ambulatory Visit: Payer: Self-pay

## 2024-01-12 ENCOUNTER — Other Ambulatory Visit (HOSPITAL_COMMUNITY): Payer: Self-pay

## 2024-01-12 DIAGNOSIS — C189 Malignant neoplasm of colon, unspecified: Secondary | ICD-10-CM | POA: Diagnosis not present

## 2024-01-12 DIAGNOSIS — I82622 Acute embolism and thrombosis of deep veins of left upper extremity: Secondary | ICD-10-CM

## 2024-01-12 NOTE — Progress Notes (Unsigned)
 [facility] CONSULT NOTE  Patient Care Team: Loreli Kins, MD as PCP - General (Family Medicine) Livingston Rigg, MD as Consulting Physician (Dermatology) Pickenpack-Cousar, Fannie SAILOR, NP as Nurse Practitioner (Hospice and Palliative Medicine)  CHIEF COMPLAINTS/PURPOSE OF CONSULTATION:  Adenocarcinoma colon  ASSESSMENT & PLAN:  No problem-specific Assessment & Plan notes found for this encounter.   No orders of the defined types were placed in this encounter.    HISTORY OF PRESENTING ILLNESS:  Tina Mccullough 73 y.o. female is here because of Adenocarcinoma colon  Tina Mccullough 73 y.o. female this is a very pleasant female patient who went to [facility] on 07/01/2023 due to weakness, abdominal pain, and bloody stools x 4 days.  Patient saw her primary physician for complaints as noted and was sent to have an outpatient CT scan.  That scan showed a mass with colitis.  Also was noted a drop in hemoglobin, which was concerning for colon cancer.   Workup in the ED included CT scans which showed obstructing masslike lesion in hepatic flexure of colon highly suspicious for colon carcinoma. Status post colonoscopy on 07/04/2023 which also showed large obstructing and infiltrative mass in the proximal transverse colon.  Pathology from biopsy of transverse colon showed adenocarcinoma, superficial fragments.  No submucosa present to evaluate for submucosal invasion.  She had right colon resection which showed invasive moderately differentiated adenocarcinoma extending through submucosa muscularis propria and into subserosal subserosal adipose tissue, negative margins, unremarkable appendix.  Mismatch repair protein interpretation was abnormal with loss of major and minor MMRMLH1 and PMS2 Given the presence of some high risk features such as possible imminent perforation as well as presentation of obstruction as well as adherent omentum, we have presented her in the  tumor board to discuss role of chemotherapy. In GI TB, recommendation was to consider adj chemotherapy. MRD neg BRAF mutated. She was seen at [facility] for second opinion, given MRD neg, they felt its reasonable to omit chemotherapy based on DYNAMIC trial data.   Discussed the use of AI scribe software for clinical note transcription with the patient, who gave verbal consent to proceed.  History of Present Illness    No other pertinent ROS noted.  MEDICAL HISTORY:  Past Medical History:  Diagnosis Date   Anxiety    Depression    DVT of axillary vein, acute left (HCC)    left subclavian vein   Hypertension    PONV (postoperative nausea and vomiting)    Pulmonary embolus (HCC)     SURGICAL HISTORY: Past Surgical History:  Procedure Laterality Date   ARTHROSCOPIC REPAIR ACL Left    BIOPSY  07/04/2023   Procedure: BIOPSY;  Surgeon: Elicia Claw, MD;  Location: [facility];  Service: Gastroenterology;;   CESAREAN SECTION     COLONOSCOPY WITH PROPOFOL  N/A 07/04/2023   Procedure: COLONOSCOPY WITH PROPOFOL ;  Surgeon: Elicia Claw, MD;  Location: [facility];  Service: Gastroenterology;  Laterality: N/A;   EXCISION ORAL TUMOR Left 11/28/2014   Procedure: SURGICAL REMOVAL OF ODOTOGENIC  TUMOR AND IMPACTED TOOTH NUMBER 17;  Surgeon: Ezzie Pinal, DDS;  Location: [facility];  Service: Oral Surgery;  Laterality: Left;   HAND SURGERY Bilateral    for trigger fingers   PARTIAL COLECTOMY Right 07/06/2023   Procedure: PARTIAL COLECTOMY;  Surgeon: Signe Mitzie LABOR, MD;  Location: [facility];  Service: General;  Laterality: Right;   SUBMUCOSAL TATTOO INJECTION  07/04/2023   Procedure: SUBMUCOSAL TATTOO INJECTION;  Surgeon: Elicia Claw, MD;  Location: [facility];  Service: Gastroenterology;;    SOCIAL HISTORY: Social History   Socioeconomic History   Marital status: Widowed    Spouse name: Not on file   Number of children: Not on file   Years of education: Not on  file   Highest education level: Not on file  Occupational History   Not on file  Tobacco Use   Smoking status: Every Day    Current packs/day: 1.00    Average packs/day: 1 pack/day for 37.0 years (37.0 ttl pk-yrs)    Types: E-cigarettes, Cigarettes   Smokeless tobacco: Never   Tobacco comments:    Counseled on reduce to quit. Information on classes shared  Vaping Use   Vaping status: Never Used  Substance and Sexual Activity   Alcohol use: No   Drug use: No   Sexual activity: Not on file  Other Topics Concern   Not on file  Social History Narrative   Not on file   Social Drivers of Health   Financial Resource Strain: Low Risk  (08/19/2023)   Received from [facility]   Overall Financial Resource Strain (CARDIA)    Difficulty of Paying Living Expenses: Not very hard  Food Insecurity: No Food Insecurity (08/19/2023)   Received from [facility]   Hunger Vital Sign    Within the past 12 months, you worried that your food would run out before you got the money to buy more.: Never true    Within the past 12 months, the food you bought just didn't last and you didn't have money to get more.: Never true  Transportation Needs: No Transportation Needs (08/19/2023)   Received from [facility]   PRAPARE - Transportation    Lack of Transportation (Medical): No    Lack of Transportation (Non-Medical): No  Physical Activity: Not on file  Stress: Not on file  Social Connections: Moderately Integrated (07/03/2023)   Social Connection and Isolation Panel    Frequency of Communication with Friends and Family: Three times a week    Frequency of Social Gatherings with Friends and Family: Twice a week    Attends Religious Services: 1 to 4 times per year    Active Member of [employer] or Organizations: No    Attends Banker Meetings: Never    Marital Status: Married  Recent Concern: Social Connections - Socially Isolated (07/03/2023)   Social Connection and Isolation Panel     Frequency of Communication with Friends and Family: Never    Frequency of Social Gatherings with Friends and Family: Never    Attends Religious Services: Never    Database administrator or Organizations: Not on file    Attends Banker Meetings: Never    Marital Status: Married  Catering manager Violence: Not At Risk (07/02/2023)   Humiliation, Afraid, Rape, and Kick questionnaire    Fear of Current or Ex-Partner: No    Emotionally Abused: No    Physically Abused: No    Sexually Abused: No    FAMILY HISTORY: No family history on file.  ALLERGIES:  is allergic to beef-derived drug products, dog epithelium (canis lupus familiaris), pork-derived products, bee venom, diclofenac, and lisinopril.  MEDICATIONS:  Current Outpatient Medications  Medication Sig Dispense Refill   acetaminophen  (TYLENOL ) 500 MG tablet Take 2 tablets (1,000 mg total) by mouth every 6 (six) hours as needed.     amLODipine  (NORVASC ) 5 MG tablet Take 1 tablet (5 mg total) by mouth daily. 30  tablet 2   amLODipine  (NORVASC ) 5 MG tablet Take 1 tablet (5 mg total) by mouth daily. 100 tablet 0   ascorbic acid  (VITAMIN C ) 500 MG tablet Take 1,500 mg by mouth in the morning.     Cholecalciferol (VITAMIN D-3) 125 MCG (5000 UT) TABS Take 10,000 Units by mouth daily.     diphenhydrAMINE (BENADRYL) 25 mg capsule Take 25 mg by mouth daily as needed (ONLY FOR ALLERGIC SYMPTOMS).     EPINEPHrine  (EPIPEN ) 0.3 mg/0.3 mL DEVI Inject 0.3 mLs (0.3 mg total) into the muscle as needed. (Patient taking differently: Inject 0.3 mg into the muscle as needed (allergic reaction).) 2 Device 1   folic acid  (FOLVITE ) 1 MG tablet Take 1 tablet (1 mg total) by mouth daily. 30 tablet 2   HYDROcodone -acetaminophen  (NORCO/VICODIN) 5-325 MG tablet Take 1 tablet by mouth every 4 to 6 hours as needed for pain. 30 tablet 0   metroNIDAZOLE  (FLAGYL ) 500 MG tablet Take 1 tablet (500 mg total) by mouth 3 (three) times daily for 10 days. 30  tablet 0   Misc Natural Products (SAMBUCUS ELDERBERRY IMMUNE) CHEW Chew 2 tablets by mouth in the morning.     Nicotine  (NICODERM CQ  TD) Place 1 patch onto the skin daily as needed (for smoking cessation- while hospitalized).     olmesartan  (BENICAR ) 20 MG tablet Take 1 tablet (20 mg total) by mouth daily. 90 tablet 3   pantoprazole  (PROTONIX ) 40 MG tablet Take 1 tablet (40 mg total) by mouth daily. 30 tablet 2   rivaroxaban  (XARELTO ) 20 MG TABS tablet Take 1 tablet (20 mg total) by mouth daily with food 30 tablet 11   No current facility-administered medications for this visit.     PHYSICAL EXAMINATION: ECOG PERFORMANCE STATUS: 0 - Asymptomatic  There were no vitals filed for this visit.   There were no vitals filed for this visit.    GENERAL:alert, no distress and comfortable   LABORATORY DATA:  I have reviewed the data as listed Lab Results  Component Value Date   WBC 9.9 12/20/2023   HGB 13.1 12/20/2023   HCT 39.1 12/20/2023   MCV 81.6 12/20/2023   PLT 453 (H) 12/20/2023     Chemistry      Component Value Date/Time   NA 139 12/20/2023 1145   K 4.9 12/20/2023 1145   CL 108 12/20/2023 1145   CO2 27 12/20/2023 1145   BUN 18 12/20/2023 1145   CREATININE 0.89 12/20/2023 1145      Component Value Date/Time   CALCIUM  9.6 12/20/2023 1145   ALKPHOS 70 12/20/2023 1145   AST 12 (L) 12/20/2023 1145   ALT 11 12/20/2023 1145   BILITOT 0.3 12/20/2023 1145       RADIOGRAPHIC STUDIES: I have personally reviewed the radiological images as listed and agreed with the findings in the report. No results found.   All questions were answered. The patient knows to call the clinic with any problems, questions or concerns. I spent 30 minutes in the care of this patient including H and P, review of records, counseling and coordination of care.     Amber Stalls, MD 01/12/2024 3:38 PM

## 2024-01-25 ENCOUNTER — Telehealth: Payer: Self-pay

## 2024-01-25 NOTE — Telephone Encounter (Signed)
 Pt called with concern regarding [employer] testing and the cost. She was advised that she may receive an EOB from her insurance stating she will owe an obscene amount, but that [employer] will not bill this. She verbalized understanding and knows to call [employer] to set up next draw, and call with any further concerns.

## 2024-02-14 ENCOUNTER — Inpatient Hospital Stay: Admitting: Nurse Practitioner

## 2024-02-14 ENCOUNTER — Encounter: Payer: Self-pay | Admitting: Nurse Practitioner

## 2024-02-14 DIAGNOSIS — C189 Malignant neoplasm of colon, unspecified: Secondary | ICD-10-CM | POA: Diagnosis not present

## 2024-02-14 DIAGNOSIS — Z515 Encounter for palliative care: Secondary | ICD-10-CM

## 2024-02-14 NOTE — Progress Notes (Signed)
 Palliative Medicine [facility]  Telephone:([phone] [phone]) [phone]   Name: Tina Mccullough Date: 02/14/2024 MRN: [id number]  DOB: 1950-07-09  Patient Care Team: Loreli Kins, MD as PCP - General (Family Medicine) Livingston Rigg, MD as Consulting Physician (Dermatology) Pickenpack-Cousar, Fannie SAILOR, NP as Nurse Practitioner [facility] and Palliative Medicine)   I connected with Tina Mccullough on 02/14/24 at  4:15 PM EDT by telephone and verified that I am speaking with the correct person using two identifiers.   I discussed the limitations, risks, security and privacy concerns of performing an evaluation and management service by telemedicine and the availability of in-person appointments. I also discussed with the patient that there may be a patient responsible charge related to this service. The patient expressed understanding and agreed to proceed.   Other persons participating in the visit and their role in the encounter: N/A   Patient's location: Home   Provider's location: [facility]   INTERVAL HISTORY: Tina Mccullough is a 73 y.o. 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 Palliative ask to see for symptom management and goals of care.   SOCIAL HISTORY:     reports that she has been smoking e-cigarettes and cigarettes. She has a 37 pack-year smoking history. She has never used smokeless tobacco. She reports that she does not drink alcohol and does not use drugs.  ADVANCE DIRECTIVES:  None on file   CODE STATUS: Full code  PAST MEDICAL HISTORY: Past Medical History:  Diagnosis Date   Anxiety    Depression    DVT of axillary vein, acute left (HCC)    left subclavian vein   Hypertension    PONV (postoperative nausea and vomiting)    Pulmonary embolus (HCC)     ALLERGIES:  is allergic to beef-derived drug products, dog epithelium (canis  lupus familiaris), pork-derived products, bee venom, diclofenac, and lisinopril.  MEDICATIONS:  Current Outpatient Medications  Medication Sig Dispense Refill   acetaminophen  (TYLENOL ) 500 MG tablet Take 2 tablets (1,000 mg total) by mouth every 6 (six) hours as needed.     amLODipine  (NORVASC ) 5 MG tablet Take 1 tablet (5 mg total) by mouth daily. 30 tablet 2   amLODipine  (NORVASC ) 5 MG tablet Take 1 tablet (5 mg total) by mouth daily. 100 tablet 0   ascorbic acid  (VITAMIN C ) 500 MG tablet Take 1,500 mg by mouth in the morning.     Cholecalciferol (VITAMIN D-3) 125 MCG (5000 UT) TABS Take 10,000 Units by mouth daily.     diphenhydrAMINE (BENADRYL) 25 mg capsule Take 25 mg by mouth daily as needed (ONLY FOR ALLERGIC SYMPTOMS).     EPINEPHrine  (EPIPEN ) 0.3 mg/0.3 mL DEVI Inject 0.3 mLs (0.3 mg total) into the muscle as needed. (Patient taking differently: Inject 0.3 mg into the muscle as needed (allergic reaction).) 2 Device 1   folic acid  (FOLVITE ) 1 MG tablet Take 1 tablet (1 mg total) by mouth daily. 30 tablet 2   HYDROcodone -acetaminophen  (NORCO/VICODIN) 5-325 MG tablet Take 1 tablet by mouth every 4 to 6 hours as needed for pain. 30 tablet 0   metroNIDAZOLE  (FLAGYL ) 500 MG tablet Take 1 tablet (500 mg total) by mouth 3 (three) times daily for 10 days. 30 tablet 0   Misc Natural Products (SAMBUCUS ELDERBERRY IMMUNE) CHEW Chew 2 tablets by mouth in the morning.     Nicotine  (NICODERM CQ  TD) Place 1 patch  onto the skin daily as needed (for smoking cessation- while hospitalized).     olmesartan  (BENICAR ) 20 MG tablet Take 1 tablet (20 mg total) by mouth daily. 90 tablet 3   pantoprazole  (PROTONIX ) 40 MG tablet Take 1 tablet (40 mg total) by mouth daily. 30 tablet 2   rivaroxaban  (XARELTO ) 20 MG TABS tablet Take 1 tablet (20 mg total) by mouth daily with food 30 tablet 11   No current facility-administered medications for this visit.    VITAL SIGNS: There were no vitals taken for this  visit. There were no vitals filed for this visit.   Estimated body mass index is 22.4 kg/m as calculated from the following:   Height as of 09/27/23: 5' 5 (1.651 m).   Weight as of 09/27/23: 134 lb 9.6 oz (61.1 kg).   PERFORMANCE STATUS (ECOG) : 1 - Symptomatic but completely ambulatory   IMPRESSION: Discussed the use of AI scribe software for clinical note transcription with the patient, who gave verbal consent to proceed.  History of Present Illness Tina Mccullough is a 73 year old female with colon cancer who I connected with by phone for follow-up. No acute distress. She is doing well overall. Denies concerns with nausea, vomiting, or constipation. Occasional fatigue however remains active.   No uncontrolled symptoms. We will continue to support and monitor as needed.   I discussed the importance of continued conversation with family and their medical providers regarding overall plan of care and treatment options, ensuring decisions are within the context of the patients values and GOCs. Assessment & Plan  Goals of Care She prioritizes quality of life over quantity, focusing on maintaining current activity and enjoyment. - Prioritize quality of life in care decisions.  Colon cancer Colon cancer with a history of multiple CT scans, including those with contrast, which are not ideal due to stage 2 chronic kidney disease. She has decided against chemotherapy, prioritizing quality of life over aggressive treatment. A second opinion from [facility] confirmed the initial assessment that the cancer was fully excised. She is not interested in further CT scans as they would not alter her treatment plan. - Does not wish pursue chemotherapy. - Discuss alternative treatments if desired per Oncology.   I will follow-up with patient in 8-12 weeks. Sooner if needed.   Patient expressed understanding and was in agreement with this plan. She also understands that She can call the  clinic at any time with any questions, concerns, or complaints.   Any controlled substances utilized were prescribed in the context of palliative care. PDMP has been reviewed.   I provided 15 minutes of non face-to-face telephone visit time during this encounter, and > 50% was spent counseling as documented under my assessment & plan. Visit consisted of counseling and education dealing with the complex and emotionally intense issues of symptom management and palliative care in the setting of serious and potentially life-threatening illness.  Levon Borer, AGPCNP-BC  Palliative Medicine Team/[facility]

## 2024-02-16 DIAGNOSIS — Z85038 Personal history of other malignant neoplasm of large intestine: Secondary | ICD-10-CM | POA: Diagnosis not present

## 2024-02-16 DIAGNOSIS — Z86718 Personal history of other venous thrombosis and embolism: Secondary | ICD-10-CM | POA: Diagnosis not present

## 2024-03-08 ENCOUNTER — Telehealth: Payer: Self-pay | Admitting: Nurse Practitioner

## 2024-03-08 NOTE — Telephone Encounter (Signed)
 Scheduled patient for palliative appointment. Called and spoke with the patient, she is aware.

## 2024-03-22 DIAGNOSIS — N8110 Cystocele, unspecified: Secondary | ICD-10-CM | POA: Diagnosis not present

## 2024-03-22 DIAGNOSIS — Z4689 Encounter for fitting and adjustment of other specified devices: Secondary | ICD-10-CM | POA: Diagnosis not present

## 2024-03-22 DIAGNOSIS — N814 Uterovaginal prolapse, unspecified: Secondary | ICD-10-CM | POA: Diagnosis not present

## 2024-03-30 DIAGNOSIS — Z4689 Encounter for fitting and adjustment of other specified devices: Secondary | ICD-10-CM | POA: Diagnosis not present

## 2024-04-06 ENCOUNTER — Other Ambulatory Visit: Payer: Self-pay

## 2024-04-06 ENCOUNTER — Other Ambulatory Visit (HOSPITAL_COMMUNITY): Payer: Self-pay

## 2024-04-06 DIAGNOSIS — M8580 Other specified disorders of bone density and structure, unspecified site: Secondary | ICD-10-CM | POA: Diagnosis not present

## 2024-04-06 DIAGNOSIS — I10 Essential (primary) hypertension: Secondary | ICD-10-CM | POA: Diagnosis not present

## 2024-04-06 DIAGNOSIS — Z0000 Encounter for general adult medical examination without abnormal findings: Secondary | ICD-10-CM | POA: Diagnosis not present

## 2024-04-06 DIAGNOSIS — Z1331 Encounter for screening for depression: Secondary | ICD-10-CM | POA: Diagnosis not present

## 2024-04-06 DIAGNOSIS — R7303 Prediabetes: Secondary | ICD-10-CM | POA: Diagnosis not present

## 2024-04-06 DIAGNOSIS — N182 Chronic kidney disease, stage 2 (mild): Secondary | ICD-10-CM | POA: Diagnosis not present

## 2024-04-06 DIAGNOSIS — Z960 Presence of urogenital implants: Secondary | ICD-10-CM | POA: Diagnosis not present

## 2024-04-06 DIAGNOSIS — N8110 Cystocele, unspecified: Secondary | ICD-10-CM | POA: Diagnosis not present

## 2024-04-06 DIAGNOSIS — Z86718 Personal history of other venous thrombosis and embolism: Secondary | ICD-10-CM | POA: Diagnosis not present

## 2024-04-06 DIAGNOSIS — J440 Chronic obstructive pulmonary disease with acute lower respiratory infection: Secondary | ICD-10-CM | POA: Diagnosis not present

## 2024-04-06 DIAGNOSIS — Z85038 Personal history of other malignant neoplasm of large intestine: Secondary | ICD-10-CM | POA: Diagnosis not present

## 2024-04-06 MED ORDER — OLMESARTAN MEDOXOMIL 20 MG PO TABS
20.0000 mg | ORAL_TABLET | Freq: Every day | ORAL | 3 refills | Status: AC
  Filled 2024-04-06: qty 90, 90d supply, fill #0
  Filled 2024-07-10: qty 90, 90d supply, fill #1

## 2024-04-06 MED ORDER — AMLODIPINE BESYLATE 5 MG PO TABS
5.0000 mg | ORAL_TABLET | Freq: Every day | ORAL | 3 refills | Status: AC
  Filled 2024-04-06: qty 90, 90d supply, fill #0

## 2024-04-07 ENCOUNTER — Other Ambulatory Visit: Payer: Self-pay | Admitting: Family Medicine

## 2024-04-07 DIAGNOSIS — Z1231 Encounter for screening mammogram for malignant neoplasm of breast: Secondary | ICD-10-CM

## 2024-04-11 ENCOUNTER — Other Ambulatory Visit (HOSPITAL_COMMUNITY): Payer: Self-pay

## 2024-04-13 ENCOUNTER — Telehealth: Payer: Self-pay

## 2024-04-13 NOTE — Telephone Encounter (Signed)
 Spoke with patient and confirmed appointment on 11/7

## 2024-04-14 ENCOUNTER — Inpatient Hospital Stay: Admitting: Hematology and Oncology

## 2024-04-14 ENCOUNTER — Other Ambulatory Visit: Payer: Self-pay

## 2024-04-14 ENCOUNTER — Encounter: Payer: Self-pay | Admitting: Hematology and Oncology

## 2024-04-14 ENCOUNTER — Inpatient Hospital Stay

## 2024-04-14 VITALS — BP 130/86 | HR 88 | Temp 98.0°F | Resp 18 | Wt 134.6 lb

## 2024-04-14 DIAGNOSIS — F17210 Nicotine dependence, cigarettes, uncomplicated: Secondary | ICD-10-CM | POA: Diagnosis not present

## 2024-04-14 DIAGNOSIS — C189 Malignant neoplasm of colon, unspecified: Secondary | ICD-10-CM

## 2024-04-14 DIAGNOSIS — K529 Noninfective gastroenteritis and colitis, unspecified: Secondary | ICD-10-CM | POA: Insufficient documentation

## 2024-04-14 DIAGNOSIS — C184 Malignant neoplasm of transverse colon: Secondary | ICD-10-CM | POA: Diagnosis not present

## 2024-04-14 DIAGNOSIS — Z9049 Acquired absence of other specified parts of digestive tract: Secondary | ICD-10-CM | POA: Diagnosis not present

## 2024-04-14 DIAGNOSIS — Z79899 Other long term (current) drug therapy: Secondary | ICD-10-CM | POA: Insufficient documentation

## 2024-04-14 DIAGNOSIS — I82622 Acute embolism and thrombosis of deep veins of left upper extremity: Secondary | ICD-10-CM

## 2024-04-14 LAB — CMP (CANCER CENTER ONLY)
ALT: 10 U/L (ref 0–44)
AST: 13 U/L — ABNORMAL LOW (ref 15–41)
Albumin: 4 g/dL (ref 3.5–5.0)
Alkaline Phosphatase: 75 U/L (ref 38–126)
Anion gap: 4 — ABNORMAL LOW (ref 5–15)
BUN: 22 mg/dL (ref 8–23)
CO2: 29 mmol/L (ref 22–32)
Calcium: 9.6 mg/dL (ref 8.9–10.3)
Chloride: 107 mmol/L (ref 98–111)
Creatinine: 0.86 mg/dL (ref 0.44–1.00)
GFR, Estimated: >60 mL/min (ref >60)
Glucose, Bld: 88 mg/dL (ref 70–99)
Potassium: 4.2 mmol/L (ref 3.5–5.1)
Sodium: 140 mmol/L (ref 135–145)
Total Bilirubin: 0.3 mg/dL (ref 0.0–1.2)
Total Protein: 6.3 g/dL — ABNORMAL LOW (ref 6.5–8.1)

## 2024-04-14 LAB — CBC WITH DIFFERENTIAL/PLATELET
Abs Immature Granulocytes: 0.02 K/uL (ref 0.00–0.07)
Basophils Absolute: 0.1 K/uL (ref 0.0–0.1)
Basophils Relative: 1 %
Eosinophils Absolute: 0.1 K/uL (ref 0.0–0.5)
Eosinophils Relative: 2 %
HCT: 41.3 % (ref 36.0–46.0)
Hemoglobin: 13.7 g/dL (ref 12.0–15.0)
Immature Granulocytes: 0 %
Lymphocytes Relative: 25 %
Lymphs Abs: 2.3 K/uL (ref 0.7–4.0)
MCH: 28.8 pg (ref 26.0–34.0)
MCHC: 33.2 g/dL (ref 30.0–36.0)
MCV: 86.9 fL (ref 80.0–100.0)
Monocytes Absolute: 0.7 K/uL (ref 0.1–1.0)
Monocytes Relative: 8 %
Neutro Abs: 6 K/uL (ref 1.7–7.7)
Neutrophils Relative %: 64 %
Platelets: 459 K/uL — ABNORMAL HIGH (ref 150–400)
RBC: 4.75 MIL/uL (ref 3.87–5.11)
RDW: 14.1 % (ref 11.5–15.5)
WBC: 9.2 K/uL (ref 4.0–10.5)
nRBC: 0 % (ref 0.0–0.2)

## 2024-04-14 LAB — CEA (ACCESS): CEA (CHCC): 4.42 ng/mL (ref 0.00–5.00)

## 2024-04-14 NOTE — Progress Notes (Signed)
 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 Uncertainty regarding tumor invasion into the omentum. Discussed the benefits and risks of adjuvant chemotherapy. She was presented in our [facility] and the recommendation was to consider chemotherapy, Guardant reveal with no detectable disease. She was seen at [facility] for second opinion, given MRD neg, they felt its reasonable to omit chemotherapy based on DYNAMIC trial data.  Assessment and Plan Assessment & Plan Malignant neoplasm of colon Colonoscopy needs to be scheduled for January 2026.  - She wants to switch to a different gastroenterologist, - Sent referral to [facility] for colonoscopy. - Ordered annual scan in February. She doesn't want to do them more frequently than this. - Instructed to drink oral and IV contrast for the scan. - Will follow up on tumor marker results from today - FU in 6 months with labs   Chronic diarrhea Ongoing since the surgery  She apparently didn't hear a ball park of how much guardant will cost for her, she is worried to proceed without knowing the cost or at least a range.  FU in 6 months with labs    HISTORY OF PRESENTING ILLNESS:  Tina Mccullough 73 y.o. female is here because of Adenocarcinoma colon  Tina Mccullough 73 y.o. female this is a very pleasant female patient who went to [facility] on 07/01/2023 due to weakness, abdominal pain, and bloody stools x 4 days.  Patient saw her primary physician for complaints as noted and was  sent to have an outpatient CT scan.  That scan showed a mass with colitis.  Also was noted a drop in hemoglobin, which was concerning for colon cancer.   Workup in the ED included CT scans which showed obstructing masslike lesion in hepatic flexure of colon highly suspicious for colon carcinoma. Status post colonoscopy on 07/04/2023 which also showed large obstructing and infiltrative mass in the proximal transverse colon.  Pathology from biopsy of transverse colon showed adenocarcinoma, superficial fragments.  No submucosa present to evaluate for submucosal invasion.  She had right colon resection which showed invasive moderately differentiated adenocarcinoma extending through submucosa muscularis propria and into subserosal subserosal adipose tissue, negative margins, unremarkable appendix.  Mismatch repair protein interpretation was abnormal with loss of major and minor MMRMLH1 and PMS2 Given the presence of some high risk features such as possible imminent perforation as well as presentation of obstruction as well as adherent omentum, we have presented her in the tumor board to discuss role of chemotherapy. In GI TB, recommendation was to consider adj chemotherapy. MRD neg BRAF mutated. She was seen at [facility] for second opinion, given MRD neg, they felt its reasonable to omit chemotherapy based on DYNAMIC trial data.  History of Present Illness  Tina Mccullough is a 73 year old female who presents for follow-up regarding her gastrointestinal symptoms and management.  She experiences ongoing diarrhea, which is influenced by her diet. Diarrhea increases with the consumption of fruits and salads, while eating eggs results in less severe symptoms. There is no presence of blood or black stools. Her appetite and weight have remained stable.  She underwent  a colonoscopy in January 2025, which led to a diagnosis and subsequent surgery. She wants to change her gastroenterologist from [facility] to  [facility].  She has discontinued Xarelto , which was prescribed for a blood clot in her arm. She is no longer receiving gel shots.  She inquires about the cost of a Guardant blood test, noting difficulties in obtaining a clear estimate from her insurance or the testing company. She mentions that insurance does not cover the test, and she is concerned about potential high costs.  She is preparing for a dog trial in December and a seminar in November. 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  No changes in breathing.  No other pertinent ROS noted.  MEDICAL HISTORY:  Past Medical History:  Diagnosis Date   Anxiety    Depression    DVT of axillary vein, acute left (HCC)    left subclavian vein   Hypertension    PONV (postoperative nausea and vomiting)    Pulmonary embolus (HCC)     SURGICAL HISTORY: Past Surgical History:  Procedure Laterality Date   ARTHROSCOPIC REPAIR ACL Left    BIOPSY  07/04/2023   Procedure: BIOPSY;  Surgeon: Elicia Claw, MD;  Location: [facility];  Service: Gastroenterology;;   CESAREAN SECTION     COLONOSCOPY WITH PROPOFOL  N/A 07/04/2023   Procedure: COLONOSCOPY WITH PROPOFOL ;  Surgeon: Elicia Claw, MD;  Location: [facility];  Service: Gastroenterology;  Laterality: N/A;   EXCISION ORAL TUMOR Left 11/28/2014   Procedure: SURGICAL REMOVAL OF ODOTOGENIC  TUMOR AND IMPACTED TOOTH NUMBER 17;  Surgeon: Ezzie Pinal, DDS;  Location: [facility];  Service: Oral Surgery;  Laterality: Left;   HAND SURGERY Bilateral    for trigger fingers   PARTIAL COLECTOMY Right 07/06/2023   Procedure: PARTIAL COLECTOMY;  Surgeon: Signe Mitzie LABOR, MD;  Location: [facility];  Service: General;  Laterality: Right;   SUBMUCOSAL TATTOO INJECTION  07/04/2023   Procedure: SUBMUCOSAL TATTOO INJECTION;  Surgeon: Elicia Claw, MD;  Location: [facility];  Service: Gastroenterology;;    SOCIAL HISTORY: Social History   Socioeconomic History    Marital status: Widowed    Spouse name: Not on file   Number of children: Not on file   Years of education: Not on file   Highest education level: Not on file  Occupational History   Not on file  Tobacco Use   Smoking status: Every Day    Current packs/day: 1.00    Average packs/day: 1 pack/day for 37.0 years (37.0 ttl pk-yrs)    Types: E-cigarettes, Cigarettes   Smokeless tobacco: Never   Tobacco comments:    Counseled on reduce to quit. Information on classes shared  Vaping Use   Vaping status: Never Used  Substance and Sexual Activity   Alcohol use: No   Drug use: No   Sexual activity: Not on file  Other Topics Concern   Not on file  Social History Narrative   Not on file   Social Drivers of Health   Financial Resource Strain: Low Risk  (08/19/2023)   Received from [facility]   Overall Financial Resource Strain (CARDIA)    Difficulty of Paying Living Expenses: Not very hard  Food Insecurity: No Food Insecurity (08/19/2023)   Received from [facility]   Hunger Vital Sign    Within the past 12 months, you worried that your food would run out before you got the money to buy more.: Never true  Within the past 12 months, the food you bought just didn't last and you didn't have money to get more.: Never true  Transportation Needs: No Transportation Needs (08/19/2023)   Received from [facility] - Transportation    Lack of Transportation (Medical): No    Lack of Transportation (Non-Medical): No  Physical Activity: Not on file  Stress: Not on file  Social Connections: Moderately Integrated (07/03/2023)   Social Connection and Isolation Panel    Frequency of Communication with Friends and Family: Three times a week    Frequency of Social Gatherings with Friends and Family: Twice a week    Attends Religious Services: 1 to 4 times per year    Active Member of [employer] or Organizations: No    Attends Banker Meetings: Never    Marital Status:  Married  Recent Concern: Social Connections - Socially Isolated (07/03/2023)   Social Connection and Isolation Panel    Frequency of Communication with Friends and Family: Never    Frequency of Social Gatherings with Friends and Family: Never    Attends Religious Services: Never    Database Administrator or Organizations: Not on file    Attends Banker Meetings: Never    Marital Status: Married  Catering Manager Violence: Not At Risk (07/02/2023)   Humiliation, Afraid, Rape, and Kick questionnaire    Fear of Current or Ex-Partner: No    Emotionally Abused: No    Physically Abused: No    Sexually Abused: No    FAMILY HISTORY: No family history on file.  ALLERGIES:  is allergic to bovine (beef) protein-containing drug products, dog epithelium (canis lupus familiaris), porcine (pork) protein-containing drug products, bee venom, diclofenac, and lisinopril.  MEDICATIONS:  Current Outpatient Medications  Medication Sig Dispense Refill   amLODipine  (NORVASC ) 5 MG tablet Take 1 tablet (5 mg total) by mouth daily. 90 tablet 3   ascorbic acid  (VITAMIN C ) 500 MG tablet Take 1,500 mg by mouth in the morning.     Cholecalciferol (VITAMIN D-3) 125 MCG (5000 UT) TABS Take 10,000 Units by mouth daily.     diphenhydrAMINE (BENADRYL) 25 mg capsule Take 25 mg by mouth daily as needed (ONLY FOR ALLERGIC SYMPTOMS).     EPINEPHrine  (EPIPEN ) 0.3 mg/0.3 mL DEVI Inject 0.3 mLs (0.3 mg total) into the muscle as needed. 2 Device 1   Misc Natural Products (SAMBUCUS ELDERBERRY IMMUNE) CHEW Chew 2 tablets by mouth in the morning.     Nicotine  (NICODERM CQ  TD) Place 1 patch onto the skin daily as needed (for smoking cessation- while hospitalized).     olmesartan  (BENICAR ) 20 MG tablet Take 1 tablet (20 mg total) by mouth daily. 90 tablet 3   olmesartan  (BENICAR ) 20 MG tablet Take 1 tablet (20 mg total) by mouth daily. 90 tablet 3   acetaminophen  (TYLENOL ) 500 MG tablet Take 2 tablets (1,000 mg total)  by mouth every 6 (six) hours as needed.     amLODipine  (NORVASC ) 5 MG tablet Take 1 tablet (5 mg total) by mouth daily. 30 tablet 2   No current facility-administered medications for this visit.     PHYSICAL EXAMINATION: ECOG PERFORMANCE STATUS: 0 - Asymptomatic  She appears well., no distress No cervical adenopathy CTA bilaterally RRR No abdominal tenderness, guarding or rigidity No LE edema.   LABORATORY DATA:  I have reviewed the data as listed Lab Results  Component Value Date   WBC 9.2 04/14/2024   HGB 13.7  04/14/2024   HCT 41.3 04/14/2024   MCV 86.9 04/14/2024   PLT 459 (H) 04/14/2024     Chemistry      Component Value Date/Time   NA 139 12/20/2023 1145   K 4.9 12/20/2023 1145   CL 108 12/20/2023 1145   CO2 27 12/20/2023 1145   BUN 18 12/20/2023 1145   CREATININE 0.89 12/20/2023 1145      Component Value Date/Time   CALCIUM  9.6 12/20/2023 1145   ALKPHOS 70 12/20/2023 1145   AST 12 (L) 12/20/2023 1145   ALT 11 12/20/2023 1145   BILITOT 0.3 12/20/2023 1145       RADIOGRAPHIC STUDIES: I have personally reviewed the radiological images as listed and agreed with the findings in the report. No results found.       Amber Stalls, MD 04/14/2024 1:17 PM

## 2024-04-17 ENCOUNTER — Encounter: Payer: Self-pay | Admitting: Nurse Practitioner

## 2024-04-17 ENCOUNTER — Other Ambulatory Visit (HOSPITAL_COMMUNITY): Payer: Self-pay

## 2024-04-17 ENCOUNTER — Inpatient Hospital Stay (HOSPITAL_BASED_OUTPATIENT_CLINIC_OR_DEPARTMENT_OTHER): Admitting: Nurse Practitioner

## 2024-04-17 VITALS — HR 93 | Temp 97.8°F | Resp 19 | Ht 65.0 in | Wt 135.6 lb

## 2024-04-17 DIAGNOSIS — Z515 Encounter for palliative care: Secondary | ICD-10-CM | POA: Diagnosis not present

## 2024-04-17 DIAGNOSIS — C189 Malignant neoplasm of colon, unspecified: Secondary | ICD-10-CM | POA: Diagnosis not present

## 2024-04-17 DIAGNOSIS — G893 Neoplasm related pain (acute) (chronic): Secondary | ICD-10-CM

## 2024-04-17 MED ORDER — ACETAMINOPHEN-CODEINE 300-30 MG PO TABS
1.0000 | ORAL_TABLET | Freq: Two times a day (BID) | ORAL | 0 refills | Status: DC | PRN
  Filled 2024-04-17: qty 30, 15d supply, fill #0

## 2024-04-17 NOTE — Progress Notes (Unsigned)
 Palliative Medicine [facility]  [phone] [phone]) [phone]   Name: Tina Mccullough Date: 04/17/2024 MRN: [id number]  DOB: 26-May-1951  Patient Care Team: Loreli Kins, MD as PCP - General (Family Medicine) Livingston Rigg, MD as Consulting Physician (Dermatology) Pickenpack-Cousar, Fannie SAILOR, NP as Nurse Practitioner [facility] and Palliative Medicine)   I connected with Tina Mccullough on 04/17/24 at  3:30 PM EST by telephone and verified that I am speaking with the correct person using two identifiers.   I discussed the limitations, risks, security and privacy concerns of performing an evaluation and management service by telemedicine and the availability of in-person appointments. I also discussed with the patient that there may be a patient responsible charge related to this service. The patient expressed understanding and agreed to proceed.   Other persons participating in the visit and their role in the encounter: N/A   Patient's location: Home   Provider's location: [facility]   INTERVAL HISTORY: Tina Mccullough is a 73 y.o. 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 Palliative ask to see for symptom management and goals of care.   SOCIAL HISTORY:     reports that she has been smoking e-cigarettes and cigarettes. She has a 37 pack-year smoking history. She has never used smokeless tobacco. She reports that she does not drink alcohol and does not use drugs.  ADVANCE DIRECTIVES:  None on file   CODE STATUS: Full code  PAST MEDICAL HISTORY: Past Medical History:  Diagnosis Date   Anxiety    Depression    DVT of axillary vein, acute left (HCC)    left subclavian vein   Hypertension    PONV (postoperative nausea and vomiting)    Pulmonary embolus (HCC)     ALLERGIES:  is allergic to bovine (beef) protein-containing drug products,  dog epithelium (canis lupus familiaris), porcine (pork) protein-containing drug products, bee venom, diclofenac, and lisinopril.  MEDICATIONS:  Current Outpatient Medications  Medication Sig Dispense Refill   amLODipine  (NORVASC ) 5 MG tablet Take 1 tablet (5 mg total) by mouth daily. 90 tablet 3   ascorbic acid  (VITAMIN C ) 500 MG tablet Take 1,500 mg by mouth in the morning.     Cholecalciferol (VITAMIN D-3) 125 MCG (5000 UT) TABS Take 10,000 Units by mouth daily.     diphenhydrAMINE (BENADRYL) 25 mg capsule Take 25 mg by mouth daily as needed (ONLY FOR ALLERGIC SYMPTOMS).     EPINEPHrine  (EPIPEN ) 0.3 mg/0.3 mL DEVI Inject 0.3 mLs (0.3 mg total) into the muscle as needed. 2 Device 1   Misc Natural Products (SAMBUCUS ELDERBERRY IMMUNE) CHEW Chew 2 tablets by mouth in the morning.     Nicotine  (NICODERM CQ  TD) Place 1 patch onto the skin daily as needed (for smoking cessation- while hospitalized).     olmesartan  (BENICAR ) 20 MG tablet Take 1 tablet (20 mg total) by mouth daily. 90 tablet 3   olmesartan  (BENICAR ) 20 MG tablet Take 1 tablet (20 mg total) by mouth daily. 90 tablet 3   No current facility-administered medications for this visit.    VITAL SIGNS: Pulse 93   Temp 97.8 F (36.6 C) (Oral)   Resp 19   Ht 5' 5 (1.651 m)   Wt 135 lb 9.6 oz (61.5 kg)   SpO2 97%   BMI 22.57 kg/m  Filed Weights   04/17/24 1519  Weight: 135 lb 9.6 oz (61.5 kg)  Estimated body mass index is 22.57 kg/m as calculated from the following:   Height as of this encounter: 5' 5 (1.651 m).   Weight as of this encounter: 135 lb 9.6 oz (61.5 kg).   PERFORMANCE STATUS (ECOG) : 1 - Symptomatic but completely ambulatory   IMPRESSION: Discussed the use of AI scribe software for clinical note transcription with the patient, who gave verbal consent to proceed.  History of Present Illness Tina Mccullough is a 73 year old female with colon cancer who I connected with by phone for  follow-up. No acute distress. She is doing well overall. Denies concerns with nausea, vomiting, or constipation. Occasional fatigue however remains active.   No uncontrolled symptoms. We will continue to support and monitor as needed.    Tina Mccullough is a 73 year old female who presents with neck and knee pain.  She experiences neck pain that worsens with changes in the weather. No other pain is reported besides her neck.  She has chronic knee pain, previously managed with a gel injection and Baker's cyst removal. Initially, the gel injection provided relief, but it is no longer effective. She has not started any new medications for her knee pain at home. She has used hydrocodone  in the past for pain management. Her pain is more pronounced at night.  Her appetite is good, and she has regained the weight lost during a previous hospital stay, now weighing 135.9 pounds. She reports good energy levels.  I discussed the importance of continued conversation with family and their medical providers regarding overall plan of care and treatment options, ensuring decisions are within the context of the patients values and GOCs. Assessment & Plan  Goals of Care She prioritizes quality of life over quantity, focusing on maintaining current activity and enjoyment. - Prioritize quality of life in care decisions.  Colon cancer Colon cancer with a history of multiple CT scans, including those with contrast, which are not ideal due to stage 2 chronic kidney disease. She has decided against chemotherapy, prioritizing quality of life over aggressive treatment. A second opinion from [facility] confirmed the initial assessment that the cancer was fully excised. She is not interested in further CT scans as they would not alter her treatment plan. - Does not wish pursue chemotherapy. - Discuss alternative treatments if desired per Oncology.   I will follow-up with patient in 8-12 weeks. Sooner if  needed.    Neoplasm-related pain management Chronic neck pain exacerbated by weather changes. Previous gel injection provided temporary relief. Current pain management includes Tylenol , but considering stronger options due to persistent pain. Discussed tramadol and Tylenol  with codeine as alternatives to hydrocodone . Tylenol  with codeine is slightly less potent than hydrocodone  but may be effective for nighttime pain relief. Insurance coverage confirmed for Tylenol  with codeine. - Prescribed Tylenol  with codeine for nighttime pain management. - Advised to contact if issues arise with obtaining medication.  Palliative care for malignant neoplasm of colon Recent follow-up with Dr. Thomasine was satisfactory. Blood work results were normal with no areas of concern. - Will schedule follow-up in 6-8 weeks unless issues arise.  Patient expressed understanding and was in agreement with this plan. She also understands that She can call the clinic at any time with any questions, concerns, or complaints.   Any controlled substances utilized were prescribed in the context of palliative care. PDMP has been reviewed.   I provided 15 minutes of non face-to-face telephone visit time during this encounter,  and > 50% was spent counseling as documented under my assessment & plan. Visit consisted of counseling and education dealing with the complex and emotionally intense issues of symptom management and palliative care in the setting of serious and potentially life-threatening illness.  Levon Borer, AGPCNP-BC  Palliative Medicine Team/[facility]

## 2024-04-25 DIAGNOSIS — Z4689 Encounter for fitting and adjustment of other specified devices: Secondary | ICD-10-CM | POA: Diagnosis not present

## 2024-05-15 ENCOUNTER — Other Ambulatory Visit: Payer: Self-pay | Admitting: Nurse Practitioner

## 2024-05-15 ENCOUNTER — Other Ambulatory Visit (HOSPITAL_COMMUNITY): Payer: Self-pay

## 2024-05-15 DIAGNOSIS — C189 Malignant neoplasm of colon, unspecified: Secondary | ICD-10-CM

## 2024-05-15 DIAGNOSIS — G893 Neoplasm related pain (acute) (chronic): Secondary | ICD-10-CM

## 2024-05-15 DIAGNOSIS — Z515 Encounter for palliative care: Secondary | ICD-10-CM

## 2024-05-15 MED ORDER — ACETAMINOPHEN-CODEINE 300-30 MG PO TABS
1.0000 | ORAL_TABLET | Freq: Two times a day (BID) | ORAL | 0 refills | Status: DC | PRN
  Filled 2024-05-15: qty 30, 15d supply, fill #0

## 2024-05-29 ENCOUNTER — Inpatient Hospital Stay: Admitting: Nurse Practitioner

## 2024-05-29 ENCOUNTER — Encounter: Payer: Self-pay | Admitting: Nurse Practitioner

## 2024-05-29 VITALS — BP 143/87 | HR 88 | Temp 97.6°F | Resp 18 | Wt 134.6 lb

## 2024-05-29 DIAGNOSIS — Z515 Encounter for palliative care: Secondary | ICD-10-CM | POA: Diagnosis not present

## 2024-05-29 DIAGNOSIS — C189 Malignant neoplasm of colon, unspecified: Secondary | ICD-10-CM | POA: Diagnosis not present

## 2024-05-29 DIAGNOSIS — L989 Disorder of the skin and subcutaneous tissue, unspecified: Secondary | ICD-10-CM | POA: Diagnosis not present

## 2024-05-29 DIAGNOSIS — G893 Neoplasm related pain (acute) (chronic): Secondary | ICD-10-CM

## 2024-05-29 MED ORDER — DOXYCYCLINE HYCLATE 100 MG PO TABS
100.0000 mg | ORAL_TABLET | Freq: Two times a day (BID) | ORAL | 0 refills | Status: AC
  Filled 2024-05-29: qty 10, 5d supply, fill #0

## 2024-05-29 NOTE — Progress Notes (Signed)
 "    Palliative Medicine [facility]  Telephone:([phone] Fax:(336) [phone]   Name: Tina Mccullough Date: 05/29/2024 MRN: [id number]  DOB: 1950/08/06  Patient Care Team: Loreli Kins, MD as PCP - General (Family Medicine) Livingston Rigg, MD as Consulting Physician (Dermatology) Pickenpack-Cousar, Fannie SAILOR, NP as Nurse Practitioner (Hospice and Palliative Medicine)    INTERVAL HISTORY: Tina Mccullough is a 73 y.o. 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 Palliative ask to see for symptom management and goals of care.   SOCIAL HISTORY:     reports that she has been smoking e-cigarettes and cigarettes. She has a 37 pack-year smoking history. She has never used smokeless tobacco. She reports that she does not drink alcohol and does not use drugs.  ADVANCE DIRECTIVES:  None on file   CODE STATUS: Full code  PAST MEDICAL HISTORY: Past Medical History:  Diagnosis Date   Anxiety    Depression    DVT of axillary vein, acute left (HCC)    left subclavian vein   Hypertension    PONV (postoperative nausea and vomiting)    Pulmonary embolus (HCC)     ALLERGIES:  is allergic to bovine (beef) protein-containing drug products, dog epithelium (canis lupus familiaris), porcine (pork) protein-containing drug products, bee venom, diclofenac, and lisinopril.  MEDICATIONS:  Current Outpatient Medications  Medication Sig Dispense Refill   doxycycline  (VIBRA -TABS) 100 MG tablet Take 1 tablet (100 mg total) by mouth 2 (two) times daily. 10 tablet 0   acetaminophen -codeine  (TYLENOL  #3) 300-30 MG tablet Take 1 tablet by mouth every 12 (twelve) hours as needed for moderate pain (pain score 4-6). 30 tablet 0   amLODipine  (NORVASC ) 5 MG tablet Take 1 tablet (5 mg total) by mouth daily. 90 tablet 3   ascorbic acid  (VITAMIN C ) 500 MG tablet Take 1,500 mg by mouth in the  morning.     Cholecalciferol (VITAMIN D-3) 125 MCG (5000 UT) TABS Take 10,000 Units by mouth daily.     diphenhydrAMINE (BENADRYL) 25 mg capsule Take 25 mg by mouth daily as needed (ONLY FOR ALLERGIC SYMPTOMS).     EPINEPHrine  (EPIPEN ) 0.3 mg/0.3 mL DEVI Inject 0.3 mLs (0.3 mg total) into the muscle as needed. 2 Device 1   Misc Natural Products (SAMBUCUS ELDERBERRY IMMUNE) CHEW Chew 2 tablets by mouth in the morning.     Nicotine  (NICODERM CQ  TD) Place 1 patch onto the skin daily as needed (for smoking cessation- while hospitalized).     olmesartan  (BENICAR ) 20 MG tablet Take 1 tablet (20 mg total) by mouth daily. 90 tablet 3   olmesartan  (BENICAR ) 20 MG tablet Take 1 tablet (20 mg total) by mouth daily. 90 tablet 3   No current facility-administered medications for this visit.    VITAL SIGNS: BP (!) 143/87 (BP Location: Left Arm, Patient Position: Sitting)   Pulse 88   Temp 97.6 F (36.4 C) (Temporal)   Resp 18   Wt 134 lb 9.6 oz (61.1 kg)   SpO2 99%   BMI 22.40 kg/m  Filed Weights   05/29/24 1556  Weight: 134 lb 9.6 oz (61.1 kg)     Estimated body mass index is 22.4 kg/m as calculated from the following:   Height as of 04/17/24: 5' 5 (1.651 m).   Weight as of this encounter: 134 lb 9.6 oz (61.1 kg).  Assessment NAD RRR Normal breathing pattern Left arm scratch, redness,  raised s/p dog scratch AAO x3  PERFORMANCE STATUS (ECOG) : 1 - Symptomatic but completely ambulatory  IMPRESSION: Discussed the use of AI scribe software for clinical note transcription with the patient, who gave verbal consent to proceed.  History of Present Illness Tina Mccullough is a 73 year old female with colon cancer who presented to clinic follow-up. She is doing well overall. Denies concerns with nausea, vomiting, or constipation. Occasional fatigue however remains active.   Her appetite is good. Weight stable at 134lbs.   She has chronic knee pain, previously managed with a  gel injection and Baker's cyst removal. Initially, the gel injection provided relief, but it is no longer effective. Has Tylenol  #3 as needed in moderate to severe pain.   She has developed a small nodule or keloid-like lesion at the site of a scratch from her dog. Initially, she applied some topical treatment, but she has not used any recently. Education provided on use of doxycycline  for skin irritation and infection. Patient aware to contact office if worsens or develops fever. Also recommended applying warm compress to area twice daily.  All questions answered and support provided.   Assessment & Plan  Goals of Care She prioritizes quality of life over quantity, focusing on maintaining current activity and enjoyment. - Prioritize quality of life in care decisions.  Neoplasm-related pain management Chronic neck pain exacerbated by weather changes. Previous gel injection provided temporary relief. Current pain management includes Tylenol , but considering stronger options due to persistent pain.  - Continue Tylenol  with codeine  for nighttime pain management.  Skin lesion after animal scratch Skin lesion developed after a scratch, forming a nodule keloid. No fever or signs of systemic infection. No recent application of topical antibiotics. Potential need for dermatological intervention if lesion does not improve with current treatment. - Prescribed doxycycline  for 5 days. - Advised warm compress application twice daily to soften the lesion. - Instructed to monitor for drainage or leakage from the lesion. - Will follow up in over a week to assess response to treatment.  Patient expressed understanding and was in agreement with this plan. She also understands that She can call the clinic at any time with any questions, concerns, or complaints.   Any controlled substances utilized were prescribed in the context of palliative care. PDMP has been reviewed.   I personally spent a total of 30  minutes in the care of the patient today including preparing to see the patient, getting/reviewing separately obtained history, performing a medically appropriate exam/evaluation, counseling and educating, placing orders, documenting clinical information in the EHR, and coordinating care. Visit consisted of counseling and education dealing with the complex and emotionally intense issues of symptom management and palliative care in the setting of serious and potentially life-threatening illness.  Levon Borer, AGPCNP-BC  Palliative Medicine Team/[facility]    "

## 2024-05-30 ENCOUNTER — Other Ambulatory Visit (HOSPITAL_COMMUNITY): Payer: Self-pay

## 2024-06-07 ENCOUNTER — Encounter: Payer: Self-pay | Admitting: Nurse Practitioner

## 2024-06-07 ENCOUNTER — Other Ambulatory Visit (HOSPITAL_COMMUNITY): Payer: Self-pay

## 2024-06-07 ENCOUNTER — Inpatient Hospital Stay: Admitting: Nurse Practitioner

## 2024-06-07 DIAGNOSIS — G893 Neoplasm related pain (acute) (chronic): Secondary | ICD-10-CM

## 2024-06-07 DIAGNOSIS — C189 Malignant neoplasm of colon, unspecified: Secondary | ICD-10-CM | POA: Diagnosis not present

## 2024-06-07 DIAGNOSIS — L03119 Cellulitis of unspecified part of limb: Secondary | ICD-10-CM | POA: Diagnosis not present

## 2024-06-07 DIAGNOSIS — Z515 Encounter for palliative care: Secondary | ICD-10-CM | POA: Diagnosis not present

## 2024-06-07 MED ORDER — ACETAMINOPHEN-CODEINE 300-30 MG PO TABS
1.0000 | ORAL_TABLET | Freq: Two times a day (BID) | ORAL | 0 refills | Status: AC | PRN
  Filled 2024-06-07: qty 45, 23d supply, fill #0

## 2024-06-07 NOTE — Progress Notes (Signed)
 "    Palliative Medicine [facility]  Telephone:([phone] Fax:(336) [phone]   Name: Tina Mccullough Date: 06/07/2024 MRN: [id number]  DOB: Apr 14, 1951  Patient Care Team: Loreli Kins, MD as PCP - General (Family Medicine) Livingston Rigg, MD as Consulting Physician (Dermatology) Pickenpack-Cousar, Fannie SAILOR, NP as Nurse Practitioner [facility] and Palliative Medicine)   I connected with Glendale Hendricks Laski on 06/07/2024 at 10:00 AM EST by telephone and verified that I am speaking with the correct person using two identifiers.   I discussed the limitations, risks, security and privacy concerns of performing an evaluation and management service by telemedicine and the availability of in-person appointments. I also discussed with the patient that there may be a patient responsible charge related to this service. The patient expressed understanding and agreed to proceed.   Other persons participating in the visit and their role in the encounter: N/A   Patients location: Home   Providers location: [facility]   INTERVAL HISTORY: Tina Mccullough is a 73 y.o. 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 Palliative ask to see for symptom management and goals of care.   SOCIAL HISTORY:     reports that she has been smoking e-cigarettes and cigarettes. She has a 37 pack-year smoking history. She has never used smokeless tobacco. She reports that she does not drink alcohol and does not use drugs.  ADVANCE DIRECTIVES:  None on file   CODE STATUS: Full code  PAST MEDICAL HISTORY: Past Medical History:  Diagnosis Date   Anxiety    Depression    DVT of axillary vein, acute left (HCC)    left subclavian vein   Hypertension    PONV (postoperative nausea and vomiting)    Pulmonary embolus (HCC)     ALLERGIES:  is allergic to bovine (beef) protein-containing drug  products, dog epithelium (canis lupus familiaris), porcine (pork) protein-containing drug products, bee venom, diclofenac, and lisinopril.  MEDICATIONS:  Current Outpatient Medications  Medication Sig Dispense Refill   acetaminophen -codeine  (TYLENOL  #3) 300-30 MG tablet Take 1 tablet by mouth every 12 (twelve) hours as needed for moderate pain (pain score 4-6). 45 tablet 0   amLODipine  (NORVASC ) 5 MG tablet Take 1 tablet (5 mg total) by mouth daily. 90 tablet 3   ascorbic acid  (VITAMIN C ) 500 MG tablet Take 1,500 mg by mouth in the morning.     Cholecalciferol (VITAMIN D-3) 125 MCG (5000 UT) TABS Take 10,000 Units by mouth daily.     diphenhydrAMINE (BENADRYL) 25 mg capsule Take 25 mg by mouth daily as needed (ONLY FOR ALLERGIC SYMPTOMS).     EPINEPHrine  (EPIPEN ) 0.3 mg/0.3 mL DEVI Inject 0.3 mLs (0.3 mg total) into the muscle as needed. 2 Device 1   Misc Natural Products (SAMBUCUS ELDERBERRY IMMUNE) CHEW Chew 2 tablets by mouth in the morning.     Nicotine  (NICODERM CQ  TD) Place 1 patch onto the skin daily as needed (for smoking cessation- while hospitalized).     olmesartan  (BENICAR ) 20 MG tablet Take 1 tablet (20 mg total) by mouth daily. 90 tablet 3   olmesartan  (BENICAR ) 20 MG tablet Take 1 tablet (20 mg total) by mouth daily. 90 tablet 3   No current facility-administered medications for this visit.    VITAL SIGNS: There were no vitals taken for this visit. There were no vitals filed for this visit.    Estimated body mass index is 22.4 kg/m  as calculated from the following:   Height as of 04/17/24: 5' 5 (1.651 m).   Weight as of 05/29/24: 134 lb 9.6 oz (61.1 kg).  Assessment NAD RRR Normal breathing pattern Left arm scratch, redness, raised s/p dog scratch AAO x3  PERFORMANCE STATUS (ECOG) : 1 - Symptomatic but completely ambulatory  IMPRESSION: Discussed the use of AI scribe software for clinical note transcription with the patient, who gave verbal consent to proceed.   History of Present Illness Tina Mccullough is a 73 year old female with colon cancer who I connected with by phone for follow-up. She is doing well overall. Denies concerns with nausea, vomiting, or constipation. Occasional fatigue however remains active.   [name] reports the small nodule on her arm remains despite use of antibiotics and warm compresses. She has an upcoming appointment with her hand doctor and plans to have them take a look in case further interventions is needed.   She reports overall her pain is well managed with use of Tylenol  #3. No adjustments to current regimen.   All questions answered and support provided.   Assessment & Plan  Goals of Care She prioritizes quality of life over quantity, focusing on maintaining current activity and enjoyment. - Prioritize quality of life in care decisions.  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 - Refilled Tylenol  #3 prescription at [facility].  Cellulitis of upper limb Persistent cellulitis of the upper limb despite antibiotic treatment and hot compresses. No improvement noted. - Continue hot compresses. - Follow up with hand specialist on January 8th for further evaluation and management.  I will plan to see patient back in 6-8 weeks. Sooner if needed.   Patient expressed understanding and was in agreement with this plan. She also understands that She can call the clinic at any time with any questions, concerns, or complaints.   Any controlled substances utilized were prescribed in the context of palliative care. PDMP has been reviewed.   I personally spent a total of 20 minutes in the care of the patient today including preparing to see the patient, getting/reviewing separately obtained history, performing a medically appropriate exam/evaluation, counseling and educating, placing orders, documenting clinical information in the EHR, and  coordinating care. Visit consisted of counseling and education dealing with the complex and emotionally intense issues of symptom management and palliative care in the setting of serious and potentially life-threatening illness.  Levon Borer, AGPCNP-BC  Palliative Medicine Team/[facility]    "

## 2024-06-14 ENCOUNTER — Telehealth: Payer: Self-pay | Admitting: Gastroenterology

## 2024-06-14 NOTE — Telephone Encounter (Signed)
 Good morning Dr. San,   DOD for this morning 1/7   Patient called stating that her Oncologist had sent over a referral for Colon adenocarcinoma. She states she is requesting the transfer of care because she had a colonoscopy 07/04/2023 at [facility] with Dr. Elicia because she was admitted in the hospital and did not have a choice who did the procedure.  Patients records are in epic.  Will you please view and advise on scheduling patient?  Thank you.

## 2024-06-16 ENCOUNTER — Encounter: Payer: Self-pay | Admitting: Hematology and Oncology

## 2024-06-22 ENCOUNTER — Other Ambulatory Visit (HOSPITAL_COMMUNITY): Payer: Self-pay

## 2024-06-22 MED ORDER — HYDROCODONE BIT-HOMATROP MBR 5-1.5 MG/5ML PO SOLN
5.0000 mL | Freq: Four times a day (QID) | ORAL | 0 refills | Status: AC | PRN
  Filled 2024-06-22: qty 120, 6d supply, fill #0

## 2024-07-11 ENCOUNTER — Other Ambulatory Visit (HOSPITAL_COMMUNITY): Payer: Self-pay

## 2024-07-13 NOTE — Telephone Encounter (Signed)
 Lvm for patient to call back to discuss scheduling OV.

## 2024-07-17 ENCOUNTER — Inpatient Hospital Stay: Admitting: Nurse Practitioner

## 2024-07-18 ENCOUNTER — Ambulatory Visit: Admitting: Physician Assistant

## 2024-10-17 ENCOUNTER — Inpatient Hospital Stay

## 2024-10-17 ENCOUNTER — Inpatient Hospital Stay: Admitting: Hematology and Oncology
